# Patient Record
Sex: Female | Born: 1940 | Race: White | Hispanic: No | Smoking: Never smoker
Health system: Southern US, Community
[De-identification: ages and names within clinical notes are randomized; demographics above are authoritative.]

## PROBLEM LIST (undated history)

## (undated) DIAGNOSIS — Z860101 Personal history of adenomatous and serrated colon polyps: Secondary | ICD-10-CM

## (undated) DIAGNOSIS — M810 Age-related osteoporosis without current pathological fracture: Secondary | ICD-10-CM

## (undated) DIAGNOSIS — R1013 Epigastric pain: Secondary | ICD-10-CM

## (undated) DIAGNOSIS — K219 Gastro-esophageal reflux disease without esophagitis: Secondary | ICD-10-CM

## (undated) DIAGNOSIS — H25013 Cortical age-related cataract, bilateral: Secondary | ICD-10-CM

## (undated) DIAGNOSIS — K2970 Gastritis, unspecified, without bleeding: Secondary | ICD-10-CM

## (undated) DIAGNOSIS — A048 Other specified bacterial intestinal infections: Secondary | ICD-10-CM

## (undated) DIAGNOSIS — C4492 Squamous cell carcinoma of skin, unspecified: Secondary | ICD-10-CM

## (undated) DIAGNOSIS — J45909 Unspecified asthma, uncomplicated: Secondary | ICD-10-CM

## (undated) DIAGNOSIS — F329 Major depressive disorder, single episode, unspecified: Secondary | ICD-10-CM

## (undated) DIAGNOSIS — K449 Diaphragmatic hernia without obstruction or gangrene: Secondary | ICD-10-CM

## (undated) DIAGNOSIS — L570 Actinic keratosis: Secondary | ICD-10-CM

## (undated) DIAGNOSIS — F32A Depression, unspecified: Secondary | ICD-10-CM

## (undated) DIAGNOSIS — M1990 Unspecified osteoarthritis, unspecified site: Secondary | ICD-10-CM

## (undated) DIAGNOSIS — K209 Esophagitis, unspecified without bleeding: Secondary | ICD-10-CM

## (undated) DIAGNOSIS — B029 Zoster without complications: Secondary | ICD-10-CM

## (undated) DIAGNOSIS — C801 Malignant (primary) neoplasm, unspecified: Secondary | ICD-10-CM

## (undated) DIAGNOSIS — E7800 Pure hypercholesterolemia, unspecified: Secondary | ICD-10-CM

## (undated) DIAGNOSIS — K279 Peptic ulcer, site unspecified, unspecified as acute or chronic, without hemorrhage or perforation: Secondary | ICD-10-CM

## (undated) DIAGNOSIS — K649 Unspecified hemorrhoids: Secondary | ICD-10-CM

## (undated) DIAGNOSIS — Z86010 Personal history of colonic polyps: Secondary | ICD-10-CM

## (undated) DIAGNOSIS — F419 Anxiety disorder, unspecified: Secondary | ICD-10-CM

## (undated) DIAGNOSIS — Z8719 Personal history of other diseases of the digestive system: Secondary | ICD-10-CM

## (undated) HISTORY — PX: OTHER SURGICAL HISTORY: SHX169

## (undated) HISTORY — DX: Squamous cell carcinoma of skin, unspecified: C44.92

## (undated) HISTORY — DX: Actinic keratosis: L57.0

## (undated) HISTORY — PX: COLON SURGERY: SHX602

## (undated) HISTORY — PX: BREAST CYST ASPIRATION: SHX578

---

## 1974-06-14 HISTORY — PX: TUBAL LIGATION: SHX77

## 1997-06-14 HISTORY — PX: OTHER SURGICAL HISTORY: SHX169

## 1998-06-14 HISTORY — PX: BREAST BIOPSY: SHX20

## 2005-03-03 ENCOUNTER — Ambulatory Visit: Payer: Self-pay

## 2005-06-23 ENCOUNTER — Ambulatory Visit: Payer: Self-pay | Admitting: Unknown Physician Specialty

## 2006-03-07 ENCOUNTER — Ambulatory Visit: Payer: Self-pay | Admitting: *Deleted

## 2007-02-14 ENCOUNTER — Ambulatory Visit: Payer: Self-pay | Admitting: *Deleted

## 2007-04-13 ENCOUNTER — Ambulatory Visit: Payer: Self-pay | Admitting: *Deleted

## 2008-02-20 ENCOUNTER — Ambulatory Visit: Payer: Self-pay | Admitting: *Deleted

## 2008-04-01 ENCOUNTER — Ambulatory Visit: Payer: Self-pay | Admitting: Unknown Physician Specialty

## 2008-04-15 ENCOUNTER — Ambulatory Visit: Payer: Self-pay | Admitting: *Deleted

## 2008-08-30 ENCOUNTER — Ambulatory Visit: Payer: Self-pay | Admitting: Unknown Physician Specialty

## 2008-09-11 ENCOUNTER — Ambulatory Visit: Payer: Self-pay | Admitting: Unknown Physician Specialty

## 2009-04-16 ENCOUNTER — Ambulatory Visit: Payer: Self-pay | Admitting: Anesthesiology

## 2010-05-25 ENCOUNTER — Ambulatory Visit: Payer: Self-pay | Admitting: Anesthesiology

## 2010-10-28 ENCOUNTER — Ambulatory Visit: Payer: Self-pay | Admitting: Ophthalmology

## 2010-11-10 ENCOUNTER — Ambulatory Visit: Payer: Self-pay | Admitting: Ophthalmology

## 2010-12-30 ENCOUNTER — Ambulatory Visit: Payer: Self-pay | Admitting: Anesthesiology

## 2011-03-24 ENCOUNTER — Ambulatory Visit: Payer: Self-pay | Admitting: Ophthalmology

## 2011-06-15 HISTORY — PX: CATARACT EXTRACTION: SUR2

## 2011-06-15 HISTORY — PX: CATARACT EXTRACTION, BILATERAL: SHX1313

## 2011-11-01 DIAGNOSIS — H2510 Age-related nuclear cataract, unspecified eye: Secondary | ICD-10-CM | POA: Diagnosis not present

## 2011-11-01 DIAGNOSIS — Z961 Presence of intraocular lens: Secondary | ICD-10-CM | POA: Diagnosis not present

## 2011-11-11 DIAGNOSIS — H43819 Vitreous degeneration, unspecified eye: Secondary | ICD-10-CM | POA: Diagnosis not present

## 2011-11-26 DIAGNOSIS — H0019 Chalazion unspecified eye, unspecified eyelid: Secondary | ICD-10-CM | POA: Diagnosis not present

## 2011-12-03 DIAGNOSIS — H01009 Unspecified blepharitis unspecified eye, unspecified eyelid: Secondary | ICD-10-CM | POA: Diagnosis not present

## 2011-12-20 DIAGNOSIS — L820 Inflamed seborrheic keratosis: Secondary | ICD-10-CM | POA: Diagnosis not present

## 2011-12-20 DIAGNOSIS — L739 Follicular disorder, unspecified: Secondary | ICD-10-CM | POA: Diagnosis not present

## 2011-12-20 DIAGNOSIS — L821 Other seborrheic keratosis: Secondary | ICD-10-CM | POA: Diagnosis not present

## 2011-12-20 DIAGNOSIS — L819 Disorder of pigmentation, unspecified: Secondary | ICD-10-CM | POA: Diagnosis not present

## 2011-12-21 DIAGNOSIS — H0019 Chalazion unspecified eye, unspecified eyelid: Secondary | ICD-10-CM | POA: Diagnosis not present

## 2012-01-04 DIAGNOSIS — H0019 Chalazion unspecified eye, unspecified eyelid: Secondary | ICD-10-CM | POA: Diagnosis not present

## 2012-01-04 DIAGNOSIS — Z124 Encounter for screening for malignant neoplasm of cervix: Secondary | ICD-10-CM | POA: Diagnosis not present

## 2012-01-04 DIAGNOSIS — N952 Postmenopausal atrophic vaginitis: Secondary | ICD-10-CM | POA: Diagnosis not present

## 2012-01-04 DIAGNOSIS — N764 Abscess of vulva: Secondary | ICD-10-CM | POA: Diagnosis not present

## 2012-01-04 DIAGNOSIS — Z01419 Encounter for gynecological examination (general) (routine) without abnormal findings: Secondary | ICD-10-CM | POA: Diagnosis not present

## 2012-01-04 DIAGNOSIS — L940 Localized scleroderma [morphea]: Secondary | ICD-10-CM | POA: Diagnosis not present

## 2012-01-04 DIAGNOSIS — N393 Stress incontinence (female) (male): Secondary | ICD-10-CM | POA: Diagnosis not present

## 2012-01-10 DIAGNOSIS — M810 Age-related osteoporosis without current pathological fracture: Secondary | ICD-10-CM | POA: Diagnosis not present

## 2012-01-10 DIAGNOSIS — K219 Gastro-esophageal reflux disease without esophagitis: Secondary | ICD-10-CM | POA: Diagnosis not present

## 2012-01-10 DIAGNOSIS — E7800 Pure hypercholesterolemia, unspecified: Secondary | ICD-10-CM | POA: Diagnosis not present

## 2012-01-10 DIAGNOSIS — F329 Major depressive disorder, single episode, unspecified: Secondary | ICD-10-CM | POA: Diagnosis not present

## 2012-01-18 ENCOUNTER — Ambulatory Visit: Payer: Self-pay

## 2012-01-18 DIAGNOSIS — M899 Disorder of bone, unspecified: Secondary | ICD-10-CM | POA: Diagnosis not present

## 2012-01-18 DIAGNOSIS — M810 Age-related osteoporosis without current pathological fracture: Secondary | ICD-10-CM | POA: Diagnosis not present

## 2012-01-19 ENCOUNTER — Ambulatory Visit: Payer: Self-pay | Admitting: Obstetrics and Gynecology

## 2012-01-19 DIAGNOSIS — Z1231 Encounter for screening mammogram for malignant neoplasm of breast: Secondary | ICD-10-CM | POA: Diagnosis not present

## 2012-03-03 DIAGNOSIS — M546 Pain in thoracic spine: Secondary | ICD-10-CM | POA: Diagnosis not present

## 2012-03-03 DIAGNOSIS — M545 Low back pain: Secondary | ICD-10-CM | POA: Diagnosis not present

## 2012-03-03 DIAGNOSIS — I10 Essential (primary) hypertension: Secondary | ICD-10-CM | POA: Diagnosis not present

## 2012-03-24 DIAGNOSIS — E7800 Pure hypercholesterolemia, unspecified: Secondary | ICD-10-CM | POA: Diagnosis not present

## 2012-03-24 DIAGNOSIS — K219 Gastro-esophageal reflux disease without esophagitis: Secondary | ICD-10-CM | POA: Diagnosis not present

## 2012-03-24 DIAGNOSIS — F329 Major depressive disorder, single episode, unspecified: Secondary | ICD-10-CM | POA: Diagnosis not present

## 2012-03-24 DIAGNOSIS — M810 Age-related osteoporosis without current pathological fracture: Secondary | ICD-10-CM | POA: Diagnosis not present

## 2012-03-27 DIAGNOSIS — Z23 Encounter for immunization: Secondary | ICD-10-CM | POA: Diagnosis not present

## 2012-04-03 DIAGNOSIS — S239XXA Sprain of unspecified parts of thorax, initial encounter: Secondary | ICD-10-CM | POA: Diagnosis not present

## 2012-04-06 DIAGNOSIS — R293 Abnormal posture: Secondary | ICD-10-CM | POA: Diagnosis not present

## 2012-04-06 DIAGNOSIS — M545 Low back pain: Secondary | ICD-10-CM | POA: Diagnosis not present

## 2012-04-06 DIAGNOSIS — S239XXA Sprain of unspecified parts of thorax, initial encounter: Secondary | ICD-10-CM | POA: Diagnosis not present

## 2012-04-10 DIAGNOSIS — M545 Low back pain: Secondary | ICD-10-CM | POA: Diagnosis not present

## 2012-04-10 DIAGNOSIS — S239XXA Sprain of unspecified parts of thorax, initial encounter: Secondary | ICD-10-CM | POA: Diagnosis not present

## 2012-04-10 DIAGNOSIS — R293 Abnormal posture: Secondary | ICD-10-CM | POA: Diagnosis not present

## 2012-04-14 DIAGNOSIS — S239XXA Sprain of unspecified parts of thorax, initial encounter: Secondary | ICD-10-CM | POA: Diagnosis not present

## 2012-04-14 DIAGNOSIS — M545 Low back pain: Secondary | ICD-10-CM | POA: Diagnosis not present

## 2012-04-14 DIAGNOSIS — R293 Abnormal posture: Secondary | ICD-10-CM | POA: Diagnosis not present

## 2012-04-18 DIAGNOSIS — M545 Low back pain: Secondary | ICD-10-CM | POA: Diagnosis not present

## 2012-04-18 DIAGNOSIS — R293 Abnormal posture: Secondary | ICD-10-CM | POA: Diagnosis not present

## 2012-04-18 DIAGNOSIS — S239XXA Sprain of unspecified parts of thorax, initial encounter: Secondary | ICD-10-CM | POA: Diagnosis not present

## 2012-04-24 DIAGNOSIS — M545 Low back pain: Secondary | ICD-10-CM | POA: Diagnosis not present

## 2012-04-24 DIAGNOSIS — S239XXA Sprain of unspecified parts of thorax, initial encounter: Secondary | ICD-10-CM | POA: Diagnosis not present

## 2012-04-24 DIAGNOSIS — R293 Abnormal posture: Secondary | ICD-10-CM | POA: Diagnosis not present

## 2012-04-27 DIAGNOSIS — M545 Low back pain: Secondary | ICD-10-CM | POA: Diagnosis not present

## 2012-04-27 DIAGNOSIS — S239XXA Sprain of unspecified parts of thorax, initial encounter: Secondary | ICD-10-CM | POA: Diagnosis not present

## 2012-04-27 DIAGNOSIS — R293 Abnormal posture: Secondary | ICD-10-CM | POA: Diagnosis not present

## 2012-05-05 DIAGNOSIS — F329 Major depressive disorder, single episode, unspecified: Secondary | ICD-10-CM | POA: Diagnosis not present

## 2012-05-05 DIAGNOSIS — E7800 Pure hypercholesterolemia, unspecified: Secondary | ICD-10-CM | POA: Diagnosis not present

## 2012-05-05 DIAGNOSIS — M810 Age-related osteoporosis without current pathological fracture: Secondary | ICD-10-CM | POA: Diagnosis not present

## 2012-05-05 DIAGNOSIS — K219 Gastro-esophageal reflux disease without esophagitis: Secondary | ICD-10-CM | POA: Diagnosis not present

## 2012-05-08 DIAGNOSIS — M545 Low back pain: Secondary | ICD-10-CM | POA: Diagnosis not present

## 2012-07-06 DIAGNOSIS — E7800 Pure hypercholesterolemia, unspecified: Secondary | ICD-10-CM | POA: Diagnosis not present

## 2012-07-06 DIAGNOSIS — R079 Chest pain, unspecified: Secondary | ICD-10-CM | POA: Diagnosis not present

## 2012-07-06 DIAGNOSIS — R109 Unspecified abdominal pain: Secondary | ICD-10-CM | POA: Diagnosis not present

## 2012-07-06 DIAGNOSIS — K219 Gastro-esophageal reflux disease without esophagitis: Secondary | ICD-10-CM | POA: Diagnosis not present

## 2012-07-11 DIAGNOSIS — R002 Palpitations: Secondary | ICD-10-CM | POA: Diagnosis not present

## 2012-07-11 DIAGNOSIS — R0789 Other chest pain: Secondary | ICD-10-CM | POA: Diagnosis not present

## 2012-07-11 DIAGNOSIS — E785 Hyperlipidemia, unspecified: Secondary | ICD-10-CM | POA: Diagnosis not present

## 2012-08-10 DIAGNOSIS — R079 Chest pain, unspecified: Secondary | ICD-10-CM | POA: Diagnosis not present

## 2012-08-10 DIAGNOSIS — R9431 Abnormal electrocardiogram [ECG] [EKG]: Secondary | ICD-10-CM | POA: Diagnosis not present

## 2012-08-10 DIAGNOSIS — R002 Palpitations: Secondary | ICD-10-CM | POA: Diagnosis not present

## 2012-08-30 DIAGNOSIS — R9431 Abnormal electrocardiogram [ECG] [EKG]: Secondary | ICD-10-CM | POA: Diagnosis not present

## 2012-08-30 DIAGNOSIS — E785 Hyperlipidemia, unspecified: Secondary | ICD-10-CM | POA: Diagnosis not present

## 2012-09-01 DIAGNOSIS — F329 Major depressive disorder, single episode, unspecified: Secondary | ICD-10-CM | POA: Diagnosis not present

## 2012-09-01 DIAGNOSIS — M810 Age-related osteoporosis without current pathological fracture: Secondary | ICD-10-CM | POA: Diagnosis not present

## 2012-09-28 DIAGNOSIS — R1310 Dysphagia, unspecified: Secondary | ICD-10-CM | POA: Diagnosis not present

## 2012-09-28 DIAGNOSIS — Z85038 Personal history of other malignant neoplasm of large intestine: Secondary | ICD-10-CM | POA: Diagnosis not present

## 2012-09-28 DIAGNOSIS — K219 Gastro-esophageal reflux disease without esophagitis: Secondary | ICD-10-CM | POA: Diagnosis not present

## 2012-10-19 ENCOUNTER — Ambulatory Visit: Payer: Self-pay | Admitting: Unknown Physician Specialty

## 2012-10-19 DIAGNOSIS — F411 Generalized anxiety disorder: Secondary | ICD-10-CM | POA: Diagnosis not present

## 2012-10-19 DIAGNOSIS — Z7982 Long term (current) use of aspirin: Secondary | ICD-10-CM | POA: Diagnosis not present

## 2012-10-19 DIAGNOSIS — Z85038 Personal history of other malignant neoplasm of large intestine: Secondary | ICD-10-CM | POA: Diagnosis not present

## 2012-10-19 DIAGNOSIS — Z880 Allergy status to penicillin: Secondary | ICD-10-CM | POA: Diagnosis not present

## 2012-10-19 DIAGNOSIS — Z8711 Personal history of peptic ulcer disease: Secondary | ICD-10-CM | POA: Diagnosis not present

## 2012-10-19 DIAGNOSIS — F329 Major depressive disorder, single episode, unspecified: Secondary | ICD-10-CM | POA: Diagnosis not present

## 2012-10-19 DIAGNOSIS — Z09 Encounter for follow-up examination after completed treatment for conditions other than malignant neoplasm: Secondary | ICD-10-CM | POA: Diagnosis not present

## 2012-10-19 DIAGNOSIS — Z79899 Other long term (current) drug therapy: Secondary | ICD-10-CM | POA: Diagnosis not present

## 2012-10-19 DIAGNOSIS — R1310 Dysphagia, unspecified: Secondary | ICD-10-CM | POA: Diagnosis not present

## 2012-10-19 DIAGNOSIS — J45909 Unspecified asthma, uncomplicated: Secondary | ICD-10-CM | POA: Diagnosis not present

## 2012-10-19 DIAGNOSIS — Z882 Allergy status to sulfonamides status: Secondary | ICD-10-CM | POA: Diagnosis not present

## 2012-10-19 DIAGNOSIS — Z8371 Family history of colonic polyps: Secondary | ICD-10-CM | POA: Diagnosis not present

## 2012-10-19 DIAGNOSIS — K2100 Gastro-esophageal reflux disease with esophagitis, without bleeding: Secondary | ICD-10-CM | POA: Diagnosis not present

## 2012-10-19 DIAGNOSIS — K5730 Diverticulosis of large intestine without perforation or abscess without bleeding: Secondary | ICD-10-CM | POA: Diagnosis not present

## 2012-10-19 DIAGNOSIS — K648 Other hemorrhoids: Secondary | ICD-10-CM | POA: Diagnosis not present

## 2012-10-19 DIAGNOSIS — D131 Benign neoplasm of stomach: Secondary | ICD-10-CM | POA: Diagnosis not present

## 2012-10-19 DIAGNOSIS — Z888 Allergy status to other drugs, medicaments and biological substances status: Secondary | ICD-10-CM | POA: Diagnosis not present

## 2012-10-19 LAB — HM COLONOSCOPY

## 2012-10-23 LAB — PATHOLOGY REPORT

## 2012-10-27 DIAGNOSIS — E7800 Pure hypercholesterolemia, unspecified: Secondary | ICD-10-CM | POA: Diagnosis not present

## 2012-10-27 DIAGNOSIS — R21 Rash and other nonspecific skin eruption: Secondary | ICD-10-CM | POA: Diagnosis not present

## 2012-10-27 DIAGNOSIS — F329 Major depressive disorder, single episode, unspecified: Secondary | ICD-10-CM | POA: Diagnosis not present

## 2012-10-27 DIAGNOSIS — K219 Gastro-esophageal reflux disease without esophagitis: Secondary | ICD-10-CM | POA: Diagnosis not present

## 2013-01-09 DIAGNOSIS — N6019 Diffuse cystic mastopathy of unspecified breast: Secondary | ICD-10-CM | POA: Diagnosis not present

## 2013-01-09 DIAGNOSIS — N644 Mastodynia: Secondary | ICD-10-CM | POA: Diagnosis not present

## 2013-01-24 ENCOUNTER — Ambulatory Visit: Payer: Self-pay | Admitting: Obstetrics and Gynecology

## 2013-01-24 DIAGNOSIS — Z1231 Encounter for screening mammogram for malignant neoplasm of breast: Secondary | ICD-10-CM | POA: Diagnosis not present

## 2013-03-02 DIAGNOSIS — F411 Generalized anxiety disorder: Secondary | ICD-10-CM | POA: Diagnosis not present

## 2013-03-02 DIAGNOSIS — K219 Gastro-esophageal reflux disease without esophagitis: Secondary | ICD-10-CM | POA: Diagnosis not present

## 2013-03-02 DIAGNOSIS — E785 Hyperlipidemia, unspecified: Secondary | ICD-10-CM | POA: Diagnosis not present

## 2013-03-02 DIAGNOSIS — R3911 Hesitancy of micturition: Secondary | ICD-10-CM | POA: Diagnosis not present

## 2013-03-29 DIAGNOSIS — Z23 Encounter for immunization: Secondary | ICD-10-CM | POA: Diagnosis not present

## 2013-03-29 DIAGNOSIS — H26499 Other secondary cataract, unspecified eye: Secondary | ICD-10-CM | POA: Diagnosis not present

## 2013-04-03 DIAGNOSIS — L723 Sebaceous cyst: Secondary | ICD-10-CM | POA: Diagnosis not present

## 2013-04-03 DIAGNOSIS — L820 Inflamed seborrheic keratosis: Secondary | ICD-10-CM | POA: Diagnosis not present

## 2013-04-03 DIAGNOSIS — L578 Other skin changes due to chronic exposure to nonionizing radiation: Secondary | ICD-10-CM | POA: Diagnosis not present

## 2013-04-03 DIAGNOSIS — D485 Neoplasm of uncertain behavior of skin: Secondary | ICD-10-CM | POA: Diagnosis not present

## 2013-04-03 DIAGNOSIS — D239 Other benign neoplasm of skin, unspecified: Secondary | ICD-10-CM | POA: Diagnosis not present

## 2013-04-03 DIAGNOSIS — D2330 Other benign neoplasm of skin of unspecified part of face: Secondary | ICD-10-CM | POA: Diagnosis not present

## 2013-04-03 DIAGNOSIS — L570 Actinic keratosis: Secondary | ICD-10-CM | POA: Diagnosis not present

## 2013-04-03 DIAGNOSIS — L821 Other seborrheic keratosis: Secondary | ICD-10-CM | POA: Diagnosis not present

## 2013-04-26 DIAGNOSIS — M549 Dorsalgia, unspecified: Secondary | ICD-10-CM | POA: Diagnosis not present

## 2013-05-02 DIAGNOSIS — H02839 Dermatochalasis of unspecified eye, unspecified eyelid: Secondary | ICD-10-CM | POA: Diagnosis not present

## 2013-05-04 DIAGNOSIS — F411 Generalized anxiety disorder: Secondary | ICD-10-CM | POA: Diagnosis not present

## 2013-05-08 ENCOUNTER — Ambulatory Visit: Payer: Self-pay | Admitting: Orthopedic Surgery

## 2013-05-08 DIAGNOSIS — S22009A Unspecified fracture of unspecified thoracic vertebra, initial encounter for closed fracture: Secondary | ICD-10-CM | POA: Diagnosis not present

## 2013-05-08 DIAGNOSIS — M546 Pain in thoracic spine: Secondary | ICD-10-CM | POA: Diagnosis not present

## 2013-06-14 HISTORY — PX: CARDIAC CATHETERIZATION: SHX172

## 2013-06-26 DIAGNOSIS — L570 Actinic keratosis: Secondary | ICD-10-CM | POA: Diagnosis not present

## 2013-06-26 DIAGNOSIS — L723 Sebaceous cyst: Secondary | ICD-10-CM | POA: Diagnosis not present

## 2013-06-26 DIAGNOSIS — L578 Other skin changes due to chronic exposure to nonionizing radiation: Secondary | ICD-10-CM | POA: Diagnosis not present

## 2013-06-26 DIAGNOSIS — L821 Other seborrheic keratosis: Secondary | ICD-10-CM | POA: Diagnosis not present

## 2013-06-26 DIAGNOSIS — L820 Inflamed seborrheic keratosis: Secondary | ICD-10-CM | POA: Diagnosis not present

## 2013-07-05 DIAGNOSIS — T1510XA Foreign body in conjunctival sac, unspecified eye, initial encounter: Secondary | ICD-10-CM | POA: Diagnosis not present

## 2013-08-03 DIAGNOSIS — E785 Hyperlipidemia, unspecified: Secondary | ICD-10-CM | POA: Diagnosis not present

## 2013-08-03 DIAGNOSIS — F3289 Other specified depressive episodes: Secondary | ICD-10-CM | POA: Diagnosis not present

## 2013-08-03 DIAGNOSIS — K219 Gastro-esophageal reflux disease without esophagitis: Secondary | ICD-10-CM | POA: Diagnosis not present

## 2013-08-03 DIAGNOSIS — F329 Major depressive disorder, single episode, unspecified: Secondary | ICD-10-CM | POA: Diagnosis not present

## 2013-09-05 DIAGNOSIS — R0982 Postnasal drip: Secondary | ICD-10-CM | POA: Diagnosis not present

## 2013-09-05 DIAGNOSIS — H6120 Impacted cerumen, unspecified ear: Secondary | ICD-10-CM | POA: Diagnosis not present

## 2013-09-25 DIAGNOSIS — D1800 Hemangioma unspecified site: Secondary | ICD-10-CM | POA: Diagnosis not present

## 2013-09-25 DIAGNOSIS — L821 Other seborrheic keratosis: Secondary | ICD-10-CM | POA: Diagnosis not present

## 2013-09-25 DIAGNOSIS — L578 Other skin changes due to chronic exposure to nonionizing radiation: Secondary | ICD-10-CM | POA: Diagnosis not present

## 2013-09-25 DIAGNOSIS — L570 Actinic keratosis: Secondary | ICD-10-CM | POA: Diagnosis not present

## 2013-09-25 DIAGNOSIS — D239 Other benign neoplasm of skin, unspecified: Secondary | ICD-10-CM | POA: Diagnosis not present

## 2013-10-05 DIAGNOSIS — M810 Age-related osteoporosis without current pathological fracture: Secondary | ICD-10-CM | POA: Diagnosis not present

## 2013-10-05 DIAGNOSIS — R5381 Other malaise: Secondary | ICD-10-CM | POA: Diagnosis not present

## 2013-10-05 DIAGNOSIS — F329 Major depressive disorder, single episode, unspecified: Secondary | ICD-10-CM | POA: Diagnosis not present

## 2013-10-05 DIAGNOSIS — F3289 Other specified depressive episodes: Secondary | ICD-10-CM | POA: Diagnosis not present

## 2013-10-05 DIAGNOSIS — R5383 Other fatigue: Secondary | ICD-10-CM | POA: Diagnosis not present

## 2013-10-05 DIAGNOSIS — F411 Generalized anxiety disorder: Secondary | ICD-10-CM | POA: Diagnosis not present

## 2013-11-09 DIAGNOSIS — J40 Bronchitis, not specified as acute or chronic: Secondary | ICD-10-CM | POA: Diagnosis not present

## 2013-11-20 DIAGNOSIS — R234 Changes in skin texture: Secondary | ICD-10-CM | POA: Diagnosis not present

## 2013-11-20 DIAGNOSIS — L723 Sebaceous cyst: Secondary | ICD-10-CM | POA: Diagnosis not present

## 2013-12-03 DIAGNOSIS — D360 Benign neoplasm of lymph nodes: Secondary | ICD-10-CM | POA: Diagnosis not present

## 2013-12-03 DIAGNOSIS — D485 Neoplasm of uncertain behavior of skin: Secondary | ICD-10-CM | POA: Diagnosis not present

## 2013-12-18 DIAGNOSIS — K219 Gastro-esophageal reflux disease without esophagitis: Secondary | ICD-10-CM | POA: Diagnosis not present

## 2013-12-18 DIAGNOSIS — F411 Generalized anxiety disorder: Secondary | ICD-10-CM | POA: Diagnosis not present

## 2013-12-18 DIAGNOSIS — E785 Hyperlipidemia, unspecified: Secondary | ICD-10-CM | POA: Diagnosis not present

## 2013-12-28 DIAGNOSIS — Z961 Presence of intraocular lens: Secondary | ICD-10-CM | POA: Diagnosis not present

## 2013-12-28 DIAGNOSIS — H2510 Age-related nuclear cataract, unspecified eye: Secondary | ICD-10-CM | POA: Diagnosis not present

## 2014-02-15 DIAGNOSIS — R234 Changes in skin texture: Secondary | ICD-10-CM | POA: Diagnosis not present

## 2014-02-15 DIAGNOSIS — L905 Scar conditions and fibrosis of skin: Secondary | ICD-10-CM | POA: Diagnosis not present

## 2014-02-19 DIAGNOSIS — Z1231 Encounter for screening mammogram for malignant neoplasm of breast: Secondary | ICD-10-CM | POA: Diagnosis not present

## 2014-02-19 DIAGNOSIS — Z01419 Encounter for gynecological examination (general) (routine) without abnormal findings: Secondary | ICD-10-CM | POA: Diagnosis not present

## 2014-03-21 ENCOUNTER — Ambulatory Visit: Payer: Self-pay | Admitting: Obstetrics and Gynecology

## 2014-03-21 DIAGNOSIS — Z1231 Encounter for screening mammogram for malignant neoplasm of breast: Secondary | ICD-10-CM | POA: Diagnosis not present

## 2014-03-22 DIAGNOSIS — M810 Age-related osteoporosis without current pathological fracture: Secondary | ICD-10-CM | POA: Diagnosis not present

## 2014-03-22 DIAGNOSIS — R296 Repeated falls: Secondary | ICD-10-CM | POA: Diagnosis not present

## 2014-03-22 DIAGNOSIS — R079 Chest pain, unspecified: Secondary | ICD-10-CM | POA: Diagnosis not present

## 2014-03-22 DIAGNOSIS — E785 Hyperlipidemia, unspecified: Secondary | ICD-10-CM | POA: Diagnosis not present

## 2014-03-22 DIAGNOSIS — Z23 Encounter for immunization: Secondary | ICD-10-CM | POA: Diagnosis not present

## 2014-03-22 DIAGNOSIS — R5383 Other fatigue: Secondary | ICD-10-CM | POA: Diagnosis not present

## 2014-03-22 DIAGNOSIS — Z1389 Encounter for screening for other disorder: Secondary | ICD-10-CM | POA: Diagnosis not present

## 2014-03-25 DIAGNOSIS — E782 Mixed hyperlipidemia: Secondary | ICD-10-CM | POA: Diagnosis not present

## 2014-03-25 DIAGNOSIS — R002 Palpitations: Secondary | ICD-10-CM | POA: Insufficient documentation

## 2014-03-25 DIAGNOSIS — R079 Chest pain, unspecified: Secondary | ICD-10-CM | POA: Diagnosis not present

## 2014-03-28 DIAGNOSIS — F329 Major depressive disorder, single episode, unspecified: Secondary | ICD-10-CM | POA: Diagnosis not present

## 2014-03-28 DIAGNOSIS — F419 Anxiety disorder, unspecified: Secondary | ICD-10-CM | POA: Diagnosis not present

## 2014-03-28 DIAGNOSIS — K219 Gastro-esophageal reflux disease without esophagitis: Secondary | ICD-10-CM | POA: Diagnosis not present

## 2014-03-28 DIAGNOSIS — E785 Hyperlipidemia, unspecified: Secondary | ICD-10-CM | POA: Diagnosis not present

## 2014-04-01 DIAGNOSIS — R9431 Abnormal electrocardiogram [ECG] [EKG]: Secondary | ICD-10-CM | POA: Diagnosis not present

## 2014-04-01 DIAGNOSIS — E782 Mixed hyperlipidemia: Secondary | ICD-10-CM | POA: Diagnosis not present

## 2014-04-01 DIAGNOSIS — R002 Palpitations: Secondary | ICD-10-CM | POA: Diagnosis not present

## 2014-04-01 DIAGNOSIS — R0602 Shortness of breath: Secondary | ICD-10-CM | POA: Diagnosis not present

## 2014-04-01 DIAGNOSIS — R079 Chest pain, unspecified: Secondary | ICD-10-CM | POA: Diagnosis not present

## 2014-04-04 DIAGNOSIS — J069 Acute upper respiratory infection, unspecified: Secondary | ICD-10-CM | POA: Diagnosis not present

## 2014-04-11 LAB — HM MAMMOGRAPHY

## 2014-04-15 DIAGNOSIS — R002 Palpitations: Secondary | ICD-10-CM | POA: Diagnosis not present

## 2014-04-15 DIAGNOSIS — E782 Mixed hyperlipidemia: Secondary | ICD-10-CM | POA: Diagnosis not present

## 2014-04-15 DIAGNOSIS — R079 Chest pain, unspecified: Secondary | ICD-10-CM | POA: Diagnosis not present

## 2014-04-15 DIAGNOSIS — R0602 Shortness of breath: Secondary | ICD-10-CM | POA: Diagnosis not present

## 2014-04-23 ENCOUNTER — Ambulatory Visit: Payer: Self-pay | Admitting: Internal Medicine

## 2014-04-23 DIAGNOSIS — Z806 Family history of leukemia: Secondary | ICD-10-CM | POA: Diagnosis not present

## 2014-04-23 DIAGNOSIS — Z8249 Family history of ischemic heart disease and other diseases of the circulatory system: Secondary | ICD-10-CM | POA: Diagnosis not present

## 2014-04-23 DIAGNOSIS — Z8711 Personal history of peptic ulcer disease: Secondary | ICD-10-CM | POA: Diagnosis not present

## 2014-04-23 DIAGNOSIS — Z8489 Family history of other specified conditions: Secondary | ICD-10-CM | POA: Diagnosis not present

## 2014-04-23 DIAGNOSIS — Z825 Family history of asthma and other chronic lower respiratory diseases: Secondary | ICD-10-CM | POA: Diagnosis not present

## 2014-04-23 DIAGNOSIS — Z86010 Personal history of colonic polyps: Secondary | ICD-10-CM | POA: Diagnosis not present

## 2014-04-23 DIAGNOSIS — Z7982 Long term (current) use of aspirin: Secondary | ICD-10-CM | POA: Diagnosis not present

## 2014-04-23 DIAGNOSIS — I200 Unstable angina: Secondary | ICD-10-CM | POA: Diagnosis not present

## 2014-04-23 DIAGNOSIS — R002 Palpitations: Secondary | ICD-10-CM | POA: Diagnosis not present

## 2014-04-23 DIAGNOSIS — Z886 Allergy status to analgesic agent status: Secondary | ICD-10-CM | POA: Diagnosis not present

## 2014-04-23 DIAGNOSIS — Z8371 Family history of colonic polyps: Secondary | ICD-10-CM | POA: Diagnosis not present

## 2014-04-23 DIAGNOSIS — Z79899 Other long term (current) drug therapy: Secondary | ICD-10-CM | POA: Diagnosis not present

## 2014-04-23 DIAGNOSIS — Z882 Allergy status to sulfonamides status: Secondary | ICD-10-CM | POA: Diagnosis not present

## 2014-04-23 DIAGNOSIS — M810 Age-related osteoporosis without current pathological fracture: Secondary | ICD-10-CM | POA: Diagnosis not present

## 2014-04-23 DIAGNOSIS — Z85038 Personal history of other malignant neoplasm of large intestine: Secondary | ICD-10-CM | POA: Diagnosis not present

## 2014-04-23 DIAGNOSIS — Z880 Allergy status to penicillin: Secondary | ICD-10-CM | POA: Diagnosis not present

## 2014-04-23 DIAGNOSIS — R948 Abnormal results of function studies of other organs and systems: Secondary | ICD-10-CM | POA: Diagnosis not present

## 2014-04-23 DIAGNOSIS — Z9109 Other allergy status, other than to drugs and biological substances: Secondary | ICD-10-CM | POA: Diagnosis not present

## 2014-04-23 DIAGNOSIS — J45909 Unspecified asthma, uncomplicated: Secondary | ICD-10-CM | POA: Diagnosis not present

## 2014-04-23 DIAGNOSIS — R079 Chest pain, unspecified: Secondary | ICD-10-CM | POA: Diagnosis not present

## 2014-04-29 ENCOUNTER — Ambulatory Visit: Payer: Self-pay | Admitting: Family Medicine

## 2014-04-29 DIAGNOSIS — M810 Age-related osteoporosis without current pathological fracture: Secondary | ICD-10-CM | POA: Diagnosis not present

## 2014-04-29 DIAGNOSIS — M8580 Other specified disorders of bone density and structure, unspecified site: Secondary | ICD-10-CM | POA: Diagnosis not present

## 2014-05-06 DIAGNOSIS — R079 Chest pain, unspecified: Secondary | ICD-10-CM | POA: Diagnosis not present

## 2014-05-06 DIAGNOSIS — R0602 Shortness of breath: Secondary | ICD-10-CM | POA: Diagnosis not present

## 2014-05-07 DIAGNOSIS — L905 Scar conditions and fibrosis of skin: Secondary | ICD-10-CM | POA: Diagnosis not present

## 2014-05-07 DIAGNOSIS — L820 Inflamed seborrheic keratosis: Secondary | ICD-10-CM | POA: Diagnosis not present

## 2014-05-07 DIAGNOSIS — L821 Other seborrheic keratosis: Secondary | ICD-10-CM | POA: Diagnosis not present

## 2014-05-17 DIAGNOSIS — F329 Major depressive disorder, single episode, unspecified: Secondary | ICD-10-CM | POA: Diagnosis not present

## 2014-05-17 DIAGNOSIS — F419 Anxiety disorder, unspecified: Secondary | ICD-10-CM | POA: Diagnosis not present

## 2014-05-17 DIAGNOSIS — M810 Age-related osteoporosis without current pathological fracture: Secondary | ICD-10-CM | POA: Diagnosis not present

## 2014-05-31 DIAGNOSIS — R350 Frequency of micturition: Secondary | ICD-10-CM | POA: Diagnosis not present

## 2014-08-08 DIAGNOSIS — F329 Major depressive disorder, single episode, unspecified: Secondary | ICD-10-CM | POA: Diagnosis not present

## 2014-08-08 DIAGNOSIS — K219 Gastro-esophageal reflux disease without esophagitis: Secondary | ICD-10-CM | POA: Diagnosis not present

## 2014-08-08 DIAGNOSIS — E785 Hyperlipidemia, unspecified: Secondary | ICD-10-CM | POA: Diagnosis not present

## 2014-08-18 DIAGNOSIS — W5381XA Bitten by other rodent, initial encounter: Secondary | ICD-10-CM | POA: Diagnosis not present

## 2014-08-18 DIAGNOSIS — Z23 Encounter for immunization: Secondary | ICD-10-CM | POA: Diagnosis not present

## 2014-08-18 DIAGNOSIS — L03113 Cellulitis of right upper limb: Secondary | ICD-10-CM | POA: Diagnosis not present

## 2014-09-05 DIAGNOSIS — H43811 Vitreous degeneration, right eye: Secondary | ICD-10-CM | POA: Diagnosis not present

## 2014-10-22 DIAGNOSIS — F418 Other specified anxiety disorders: Secondary | ICD-10-CM | POA: Diagnosis not present

## 2014-10-22 DIAGNOSIS — K219 Gastro-esophageal reflux disease without esophagitis: Secondary | ICD-10-CM | POA: Diagnosis not present

## 2014-10-22 DIAGNOSIS — M1990 Unspecified osteoarthritis, unspecified site: Secondary | ICD-10-CM | POA: Diagnosis not present

## 2014-10-22 DIAGNOSIS — J309 Allergic rhinitis, unspecified: Secondary | ICD-10-CM | POA: Diagnosis not present

## 2014-10-22 DIAGNOSIS — Z7189 Other specified counseling: Secondary | ICD-10-CM | POA: Diagnosis not present

## 2014-10-22 DIAGNOSIS — K5900 Constipation, unspecified: Secondary | ICD-10-CM | POA: Diagnosis not present

## 2014-10-22 DIAGNOSIS — E782 Mixed hyperlipidemia: Secondary | ICD-10-CM | POA: Diagnosis not present

## 2014-10-22 DIAGNOSIS — M810 Age-related osteoporosis without current pathological fracture: Secondary | ICD-10-CM | POA: Diagnosis not present

## 2014-10-22 LAB — CBC AND DIFFERENTIAL
HCT: 40 % (ref 36–46)
Hemoglobin: 13.3 g/dL (ref 12.0–16.0)
Neutrophils Absolute: 47 /uL
Platelets: 161 10*3/uL (ref 150–399)
WBC: 5 10^3/mL

## 2014-10-22 LAB — BASIC METABOLIC PANEL
BUN: 12 mg/dL (ref 4–21)
Creatinine: 0.7 mg/dL (ref 0.5–1.1)
Glucose: 87 mg/dL
Potassium: 3.8 mmol/L (ref 3.4–5.3)
SODIUM: 143 mmol/L (ref 137–147)

## 2014-10-22 LAB — LIPID PANEL
CHOLESTEROL: 206 mg/dL — AB (ref 0–200)
HDL: 83 mg/dL — AB (ref 35–70)
LDL Cholesterol: 107 mg/dL
TRIGLYCERIDES: 82 mg/dL (ref 40–160)

## 2014-10-22 LAB — HEPATIC FUNCTION PANEL
ALK PHOS: 40 U/L (ref 25–125)
ALT: 14 U/L (ref 7–35)
AST: 17 U/L (ref 13–35)
Bilirubin, Total: 0.3 mg/dL

## 2014-10-22 LAB — TSH: TSH: 2.32 u[IU]/mL (ref 0.41–5.90)

## 2014-10-28 DIAGNOSIS — F32A Anxiety disorder, unspecified: Secondary | ICD-10-CM | POA: Insufficient documentation

## 2014-10-28 DIAGNOSIS — F419 Anxiety disorder, unspecified: Secondary | ICD-10-CM

## 2014-10-28 DIAGNOSIS — K219 Gastro-esophageal reflux disease without esophagitis: Secondary | ICD-10-CM | POA: Insufficient documentation

## 2014-10-28 DIAGNOSIS — K5909 Other constipation: Secondary | ICD-10-CM | POA: Insufficient documentation

## 2014-10-28 DIAGNOSIS — Z789 Other specified health status: Secondary | ICD-10-CM | POA: Insufficient documentation

## 2014-10-28 DIAGNOSIS — E7849 Other hyperlipidemia: Secondary | ICD-10-CM | POA: Insufficient documentation

## 2014-10-28 DIAGNOSIS — M810 Age-related osteoporosis without current pathological fracture: Secondary | ICD-10-CM | POA: Insufficient documentation

## 2014-10-28 DIAGNOSIS — M1990 Unspecified osteoarthritis, unspecified site: Secondary | ICD-10-CM | POA: Insufficient documentation

## 2014-10-28 DIAGNOSIS — F329 Major depressive disorder, single episode, unspecified: Secondary | ICD-10-CM | POA: Insufficient documentation

## 2014-10-28 DIAGNOSIS — C801 Malignant (primary) neoplasm, unspecified: Secondary | ICD-10-CM | POA: Insufficient documentation

## 2014-10-28 DIAGNOSIS — K259 Gastric ulcer, unspecified as acute or chronic, without hemorrhage or perforation: Secondary | ICD-10-CM | POA: Insufficient documentation

## 2014-10-28 DIAGNOSIS — Z86010 Personal history of colonic polyps: Secondary | ICD-10-CM | POA: Insufficient documentation

## 2014-10-28 DIAGNOSIS — Z85038 Personal history of other malignant neoplasm of large intestine: Secondary | ICD-10-CM | POA: Insufficient documentation

## 2014-11-12 DIAGNOSIS — L821 Other seborrheic keratosis: Secondary | ICD-10-CM | POA: Diagnosis not present

## 2014-11-12 DIAGNOSIS — D229 Melanocytic nevi, unspecified: Secondary | ICD-10-CM | POA: Diagnosis not present

## 2014-11-12 DIAGNOSIS — L814 Other melanin hyperpigmentation: Secondary | ICD-10-CM | POA: Diagnosis not present

## 2014-11-12 DIAGNOSIS — L820 Inflamed seborrheic keratosis: Secondary | ICD-10-CM | POA: Diagnosis not present

## 2014-12-20 ENCOUNTER — Encounter: Payer: Self-pay | Admitting: Family Medicine

## 2015-02-10 ENCOUNTER — Encounter: Payer: Self-pay | Admitting: Family Medicine

## 2015-02-10 ENCOUNTER — Ambulatory Visit
Admission: RE | Admit: 2015-02-10 | Discharge: 2015-02-10 | Disposition: A | Discharge status: Home / Self Care | Payer: Medicare Other | Source: Ambulatory Visit | Attending: Family Medicine | Admitting: Family Medicine

## 2015-02-10 ENCOUNTER — Ambulatory Visit (INDEPENDENT_AMBULATORY_CARE_PROVIDER_SITE_OTHER): Payer: Medicare Other | Admitting: Family Medicine

## 2015-02-10 VITALS — BP 94/62 | HR 85 | Temp 97.7°F | Resp 16 | Wt 113.8 lb

## 2015-02-10 DIAGNOSIS — M545 Low back pain, unspecified: Secondary | ICD-10-CM | POA: Insufficient documentation

## 2015-02-10 DIAGNOSIS — E782 Mixed hyperlipidemia: Secondary | ICD-10-CM | POA: Diagnosis not present

## 2015-02-10 DIAGNOSIS — M810 Age-related osteoporosis without current pathological fracture: Secondary | ICD-10-CM

## 2015-02-10 DIAGNOSIS — F418 Other specified anxiety disorders: Secondary | ICD-10-CM | POA: Diagnosis not present

## 2015-02-10 DIAGNOSIS — K5900 Constipation, unspecified: Secondary | ICD-10-CM | POA: Diagnosis not present

## 2015-02-10 DIAGNOSIS — F329 Major depressive disorder, single episode, unspecified: Secondary | ICD-10-CM | POA: Diagnosis not present

## 2015-02-10 DIAGNOSIS — R5383 Other fatigue: Secondary | ICD-10-CM | POA: Diagnosis not present

## 2015-02-10 DIAGNOSIS — I700 Atherosclerosis of aorta: Secondary | ICD-10-CM | POA: Diagnosis not present

## 2015-02-10 DIAGNOSIS — F32A Depression, unspecified: Secondary | ICD-10-CM

## 2015-02-10 DIAGNOSIS — F419 Anxiety disorder, unspecified: Secondary | ICD-10-CM

## 2015-02-10 DIAGNOSIS — M549 Dorsalgia, unspecified: Secondary | ICD-10-CM | POA: Diagnosis present

## 2015-02-10 MED ORDER — CLONAZEPAM 1 MG PO TBDP: 1.0000 mg | ORAL_TABLET | Freq: Every day | ORAL | Status: DC

## 2015-02-10 NOTE — Progress Notes (Signed)
Patient ID: Ann Young, female   DOB: 12-23-1940, 74 y.o.   MRN: [id number]   Chief Complaint  Patient presents with  . Follow-up    3 month follow up   Subjective:  HPI  This 74 year old female presents for follow up of anxiety with panic episodes. Well controlled as long as she remembers to take the Clonazepam 1 mg at bedtime and Celexa 40 mg qd. No depressive symptoms or suicidal ideation. Sleeping fairly well and feels appetite has been normal.  Also, has noticed recurrent flares of left upper lumbar pressure/tightness pain that will radiate to the left flank area. Usually occurs with house work and promptly resolves with lying down for 30 minutes. Denies any falls, weakness, numbness or injuries. With history of GERD and gastric ulcer, she does not tolerate NSAID's but gets some help with Robaxin at night.   Prior to Admission medications   Medication Sig Start Date End Date Taking? Authorizing Provider  alendronate (FOSAMAX) 70 MG tablet Take 1 tablet by mouth once a week.   Yes Historical Provider, MD  aspirin 81 MG tablet Take 1 tablet by mouth daily.   Yes Historical Provider, MD  Calcium Carb-Cholecalciferol 600-200 MG-UNIT TABS Take 1 tablet by mouth 2 (two) times daily.   Yes Historical Provider, MD  cetirizine (ZYRTEC) 10 MG tablet Take 1 tablet by mouth daily.   Yes Historical Provider, MD  Cholecalciferol 1000 UNITS capsule Take 1 capsule by mouth daily.   Yes Historical Provider, MD  citalopram (CELEXA) 40 MG tablet Take 1 tablet by mouth daily.   Yes Historical Provider, MD  clonazePAM (KLONOPIN) 1 MG disintegrating tablet Take 1 tablet by mouth at bedtime.   Yes Historical Provider, MD  fluticasone (FLONASE) 50 MCG/ACT nasal spray Place 1 spray into the nose daily. Each nostril   Yes Historical Provider, MD  lovastatin (MEVACOR) 40 MG tablet Take 1 tablet by mouth daily.   Yes Historical Provider, MD  MULTIPLE VITAMIN PO Take 1 capsule by mouth daily.   Yes Historical  Provider, MD  OMEGA-3 FATTY ACIDS PO Take 1 tablet by mouth daily.   Yes Historical Provider, MD  omeprazole (PRILOSEC) 40 MG capsule Take 1 capsule by mouth daily.   Yes Historical Provider, MD  ranitidine (ZANTAC) 150 MG tablet Take 1 tablet by mouth daily.   Yes Historical Provider, MD    Patient Active Problem List   Diagnosis Date Noted  . Problems influencing health status 10/28/2014  . Arthritis 10/28/2014  . Malignant neoplasm 10/28/2014  . History of colonic polyps 10/28/2014  . Chronic constipation 10/28/2014  . Anxiety and depression 10/28/2014  . Acid reflux 10/28/2014  . History of malignant neoplasm of large intestine 10/28/2014  . 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 OP (osteoporosis) 10/28/2014  . Gastric ulcer 10/28/2014  . 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 Awareness of heartbeats 03/25/2014   Past Surgical History  Procedure Laterality Date  . Tubal ligation  1976  . Breast biopsy Left 2000    benign  . Resection of colorectal cancer without sequela  1999  . Cataract extraction, bilateral Bilateral 2013    Dr. Willy Eddy   History reviewed. No pertinent past medical history.  Social History   Social History  . Marital Status: Married    Spouse Name: N/A  . Number of Children: N/A  . Years of Education: N/A   Occupational History  . Not on file.   Social History  Main Topics  . Smoking status: Never Smoker   . Smokeless tobacco: Never Used  . Alcohol Use: No  . Drug Use: No  . Sexual Activity: Not on file   Other Topics Concern  . Not on file   Social History Narrative   Allergies  Allergen Reactions  . Clarithromycin Other (See Comments)    Questionable Biaxin vs Flagyl with chest discomfort & irregular heartbeat (most likely thought to be related to the Biaxin)  . Ibuprofen Other (See Comments)    To prevent swelling,increase hr/bp  . Lipitor  [Atorvastatin] Other (See Comments)     Mayalgia  . Metronidazole Other (See Comments)    Questionable Biaxin vs Flagyl with chest discomfort & irregular heartbeat (most likely thought to be related to the Biaxin)  . Other Other (See Comments)    decongestants  . Penicillins   . Simvastatin Other (See Comments)    Hari Loss  . Sulfa Antibiotics   . Toviaz  [Fesoterodine Fumarate Er] Other (See Comments)    weakness   Review of Systems  Constitutional: Positive for malaise/fatigue.  HENT: Positive for congestion. Negative for ear pain and sore throat.        Occasional nasal stuffiness and sneezing. Controlled by Zyrtec or Benadryl.  Respiratory: Positive for wheezing.        Occasional with housework.   Gastrointestinal: Positive for heartburn.       Well controlled with Prilosec or Zantac.  Genitourinary: Positive for urgency.       History of OAB but stopped all that medication because of cost. Get up 2-3 times a night - "for years".  Musculoskeletal: Positive for back pain.  Neurological: Negative for headaches.    There is no immunization history on file for this patient. Objective:  BP 94/62 mmHg  Pulse 85  Temp(Src) 97.7 F (36.5 C) (Oral)  Resp 16  Wt 113 lb 12.8 oz (51.619 kg)  SpO2 94%  Physical Exam  Constitutional: She is oriented to person, place, and time and well-developed, well-nourished, and in no distress.  HENT:  Head: Normocephalic and atraumatic.  Eyes: Conjunctivae and EOM are normal.  Neck: Normal range of motion. Neck supple.  Cardiovascular: Normal rate, regular rhythm and normal heart sounds.   Pulmonary/Chest: Effort normal and breath sounds normal.  Abdominal: Soft. Bowel sounds are normal.  Musculoskeletal: Normal range of motion.  Intermittent pain in left upper lumbar region. Suspect spasms but good ROM today. SLR's 90 degrees without pain. Good strength throughout. No tenderness to palpation or percussion.  Neurological: She is alert and oriented to person, place, and time. She  has normal reflexes.  Psychiatric: Memory, affect and judgment normal. Her mood appears anxious. She exhibits a depressed mood.   Lab Results  Component Value Date   WBC 5.0 10/22/2014   HGB 13.3 10/22/2014   HCT 40 10/22/2014   PLT 161 10/22/2014   CHOL 206* 10/22/2014   TRIG 82 10/22/2014   HDL 83* 10/22/2014   LDLCALC 107 10/22/2014   TSH 2.32 10/22/2014    CMP     Component Value Date/Time   NA 143 10/22/2014   K 3.8 10/22/2014   BUN 12 10/22/2014   CREATININE 0.7 10/22/2014   AST 17 10/22/2014   ALT 14 10/22/2014   ALKPHOS 40 10/22/2014    Assessment and Plan :  1. Low back pain without sciatica, unspecified back pain laterality Has reported back pains for years with a history of arthritis. Robaxin helps  with spasms but afraid to take NSAID's with her history of  GERD and gastric ulcer. No radiation of pain or loss of strength. - CBC with Differential/Platelet - DG Lumbar Spine Complete; Future  2. Anxiety and depression  Well controlled with Clonazepam 1 mg at bedtime and Celexa 40 mg qd without side effects. No hangover sensation. Will refill Clonazepam and recheck in 3 months. - clonazePAM (KLONOPIN) 1 MG disintegrating tablet; Take 1 tablet (1 mg total) by mouth at bedtime.  Dispense: 90 tablet; Refill: 0  3. Combined fat and carbohydrate induced hyperlipemia Tolerating Lovastatin 40 mg qd without significant myalgias. Will recheck labs and continue low fat diet. Continue Omega-3 Fish oil and follow up pending reports - COMPLETE METABOLIC PANEL WITH GFR - Lipid panel  4. Fatigue due to depression Will recheck chemistry. Suspected secondary to depression and anxiety. Has plans for follow up colonoscopy with Dr. Vira Agar soon with history of colorectal cancer in 1999. Denies hematemesis, hematochezia or melena. No constipation or diarrhea.  5. Osteoporosis Last BMD was in 2015. Did not feel the Alendronate was helping back discomfort, so, she decided to stop  taking it. Advised this is not a medication for pain or arthritis and she should take it regularly to prevent further thinning or bone fractures. Follow up pending LS-spine x-rays. - DG Lumbar Spine Complete; Future    Miguel Aschoff MD [facility] 02/10/2015 10:51 AM

## 2015-02-11 LAB — COMPREHENSIVE METABOLIC PANEL
ALT: 17 IU/L (ref 0–32)
AST: 20 IU/L (ref 0–40)
Albumin/Globulin Ratio: 1.9 (ref 1.1–2.5)
Albumin: 4.7 g/dL (ref 3.5–4.8)
Alkaline Phosphatase: 42 IU/L (ref 39–117)
BUN/Creatinine Ratio: 19 (ref 11–26)
BUN: 14 mg/dL (ref 8–27)
Bilirubin Total: 0.4 mg/dL (ref 0.0–1.2)
CALCIUM: 9.9 mg/dL (ref 8.7–10.3)
CO2: 28 mmol/L (ref 18–29)
CREATININE: 0.74 mg/dL (ref 0.57–1.00)
Chloride: 99 mmol/L (ref 97–108)
GFR calc Af Amer: 94 mL/min/{1.73_m2} (ref >59)
GFR calc non Af Amer: 81 mL/min/{1.73_m2} (ref >59)
GLUCOSE: 90 mg/dL (ref 65–99)
Globulin, Total: 2.5 g/dL (ref 1.5–4.5)
Potassium: 4.8 mmol/L (ref 3.5–5.2)
SODIUM: 141 mmol/L (ref 134–144)
Total Protein: 7.2 g/dL (ref 6.0–8.5)

## 2015-02-11 LAB — CBC WITH DIFFERENTIAL/PLATELET
BASOS: 1 %
Basophils Absolute: 0 10*3/uL (ref 0.0–0.2)
EOS (ABSOLUTE): 0.1 10*3/uL (ref 0.0–0.4)
Eos: 2 %
Hematocrit: 39.4 % (ref 34.0–46.6)
Hemoglobin: 13.3 g/dL (ref 11.1–15.9)
Immature Grans (Abs): 0 10*3/uL (ref 0.0–0.1)
Immature Granulocytes: 0 %
LYMPHS ABS: 1.7 10*3/uL (ref 0.7–3.1)
LYMPHS: 36 %
MCH: 32.1 pg (ref 26.6–33.0)
MCHC: 33.8 g/dL (ref 31.5–35.7)
MCV: 95 fL (ref 79–97)
Monocytes Absolute: 0.6 10*3/uL (ref 0.1–0.9)
Monocytes: 12 %
NEUTROS ABS: 2.2 10*3/uL (ref 1.4–7.0)
Neutrophils: 49 %
Platelets: 155 10*3/uL (ref 150–379)
RBC: 4.14 x10E6/uL (ref 3.77–5.28)
RDW: 12.8 % (ref 12.3–15.4)
WBC: 4.6 10*3/uL (ref 3.4–10.8)

## 2015-02-11 LAB — LIPID PANEL
Chol/HDL Ratio: 2.5 ratio units (ref 0.0–4.4)
Cholesterol, Total: 199 mg/dL (ref 100–199)
HDL: 79 mg/dL (ref >39)
LDL CALC: 104 mg/dL — AB (ref 0–99)
Triglycerides: 79 mg/dL (ref 0–149)
VLDL CHOLESTEROL CAL: 16 mg/dL (ref 5–40)

## 2015-02-12 ENCOUNTER — Telehealth: Payer: Self-pay

## 2015-02-12 NOTE — Telephone Encounter (Signed)
Patient advised as directed below. Patient verbalized understanding.  

## 2015-02-12 NOTE — Telephone Encounter (Signed)
-----   Message from Margo Common, [place] sent at 02/11/2015  6:17 PM EDT ----- All blood tests normal. Continue present medications and recheck in 3 months.

## 2015-02-12 NOTE — Telephone Encounter (Signed)
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 No collapsed vertebrae from osteoporosis. Recommend Tylenol Arthritis for any back discomfort and stool softener (Colase or Surfak) for constipation seen on x-ray. Should continue Alendronate to prevent further progression of osteoporosis.

## 2015-02-12 NOTE — Telephone Encounter (Signed)
Patient advised as directed below. Patient verbalized understanding and agrees with treatment plan. 

## 2015-02-14 ENCOUNTER — Telehealth: Payer: Self-pay | Admitting: Family Medicine

## 2015-02-14 NOTE — Telephone Encounter (Signed)
They want to know if they can change her klonopin to the regular 1mg  pill.  Instead of the dissolving one.  It's a lot cheaper for the reg. It is what she was taking.  Please call pharmacy back [phone]  Thanks [place]

## 2015-02-20 ENCOUNTER — Other Ambulatory Visit: Payer: Self-pay

## 2015-02-20 DIAGNOSIS — F419 Anxiety disorder, unspecified: Principal | ICD-10-CM

## 2015-02-20 DIAGNOSIS — F329 Major depressive disorder, single episode, unspecified: Secondary | ICD-10-CM

## 2015-02-20 MED ORDER — CLONAZEPAM 1 MG PO TABS: 1.0000 mg | ORAL_TABLET | Freq: Every day | ORAL | Status: DC

## 2015-02-20 NOTE — Telephone Encounter (Signed)
May change Klonopin to the 1mg  regular tablet. Advise her pharmacy.

## 2015-02-20 NOTE — Telephone Encounter (Signed)
Please advise 

## 2015-02-20 NOTE — Telephone Encounter (Signed)
Per [facility] pharmacy to change Clonazepam RX to regular tablets instead of dissolvable tablets.

## 2015-02-27 DIAGNOSIS — Z961 Presence of intraocular lens: Secondary | ICD-10-CM | POA: Diagnosis not present

## 2015-03-20 DIAGNOSIS — Z23 Encounter for immunization: Secondary | ICD-10-CM | POA: Diagnosis not present

## 2015-03-25 ENCOUNTER — Other Ambulatory Visit: Payer: Self-pay | Admitting: Family Medicine

## 2015-03-25 MED ORDER — ALENDRONATE SODIUM 70 MG PO TABS: 70.0000 mg | ORAL_TABLET | ORAL | Status: DC

## 2015-03-25 MED ORDER — LOVASTATIN 40 MG PO TABS: 40.0000 mg | ORAL_TABLET | Freq: Every day | ORAL | Status: DC

## 2015-04-24 DIAGNOSIS — E782 Mixed hyperlipidemia: Secondary | ICD-10-CM | POA: Diagnosis not present

## 2015-04-24 DIAGNOSIS — R072 Precordial pain: Secondary | ICD-10-CM | POA: Diagnosis not present

## 2015-05-06 ENCOUNTER — Telehealth: Payer: Self-pay | Admitting: Family Medicine

## 2015-05-06 NOTE — Telephone Encounter (Signed)
Schedule follow up appointment to recheck progress and refill medications. Can get labs set up for the same day when in the office.

## 2015-05-06 NOTE — Telephone Encounter (Signed)
Please review

## 2015-05-06 NOTE — Telephone Encounter (Signed)
Pt is requesting a lab slip b/c she stated her labs are due in order for her to be able to get her Klonopin refilled. Pt stated she has 13 pills left  And if she needs an OV to get the refill she would like to know asap b/c she doesn't want to run out. Please advise. Thanks TNP

## 2015-05-07 DIAGNOSIS — K222 Esophageal obstruction: Secondary | ICD-10-CM

## 2015-05-07 DIAGNOSIS — M810 Age-related osteoporosis without current pathological fracture: Secondary | ICD-10-CM

## 2015-05-07 DIAGNOSIS — M1990 Unspecified osteoarthritis, unspecified site: Secondary | ICD-10-CM | POA: Insufficient documentation

## 2015-05-07 DIAGNOSIS — E7849 Other hyperlipidemia: Secondary | ICD-10-CM | POA: Insufficient documentation

## 2015-05-07 DIAGNOSIS — E782 Mixed hyperlipidemia: Secondary | ICD-10-CM

## 2015-05-07 DIAGNOSIS — F418 Other specified anxiety disorders: Secondary | ICD-10-CM | POA: Insufficient documentation

## 2015-05-07 DIAGNOSIS — Z86010 Personal history of colonic polyps: Secondary | ICD-10-CM

## 2015-05-07 DIAGNOSIS — K219 Gastro-esophageal reflux disease without esophagitis: Secondary | ICD-10-CM | POA: Insufficient documentation

## 2015-05-07 DIAGNOSIS — Z85038 Personal history of other malignant neoplasm of large intestine: Secondary | ICD-10-CM

## 2015-05-07 DIAGNOSIS — K5909 Other constipation: Secondary | ICD-10-CM | POA: Insufficient documentation

## 2015-05-07 DIAGNOSIS — Z85048 Personal history of other malignant neoplasm of rectum, rectosigmoid junction, and anus: Secondary | ICD-10-CM | POA: Insufficient documentation

## 2015-05-07 NOTE — Telephone Encounter (Signed)
Patient advised as directed below. Patient verbalized understanding. Patient scheduled for a follow up appointment on 05/12/2015.

## 2015-05-12 ENCOUNTER — Ambulatory Visit: Payer: Self-pay | Admitting: Family Medicine

## 2015-05-12 ENCOUNTER — Ambulatory Visit (INDEPENDENT_AMBULATORY_CARE_PROVIDER_SITE_OTHER): Payer: Medicare Other | Admitting: Family Medicine

## 2015-05-12 ENCOUNTER — Encounter: Payer: Self-pay | Admitting: Family Medicine

## 2015-05-12 VITALS — BP 118/64 | HR 75 | Temp 97.7°F | Resp 16 | Wt 121.0 lb

## 2015-05-12 DIAGNOSIS — F418 Other specified anxiety disorders: Secondary | ICD-10-CM

## 2015-05-12 DIAGNOSIS — E782 Mixed hyperlipidemia: Secondary | ICD-10-CM | POA: Diagnosis not present

## 2015-05-12 MED ORDER — CLONAZEPAM 1 MG PO TABS: 1.0000 mg | ORAL_TABLET | Freq: Every day | ORAL | Status: DC

## 2015-05-12 NOTE — Progress Notes (Signed)
Patient ID: Ann Young, female   DOB: 1941/05/15, 74 y.o.   MRN: [id number]    Subjective:  Hyperlipidemia This is a chronic problem. The current episode started more than 1 year ago. The problem is controlled. She has no history of chronic renal disease, liver disease or obesity. Pertinent negatives include no chest pain, leg pain, myalgias or shortness of breath. Current antihyperlipidemic treatment includes statins. There are no compliance problems.   Anxiety Presents for follow-up visit. Symptoms include excessive worry and nervous/anxious behavior. Patient reports no chest pain, hyperventilation, shortness of breath or suicidal ideas. Primary symptoms comment: some occasional crying spells. Symptoms occur occasionally. The severity of symptoms is moderate.   Her past medical history is significant for anxiety/panic attacks. Past treatments include SSRIs and benzodiazephines. The treatment provided significant relief. Compliance with prior treatments has been good.     Prior to Admission medications   Medication Sig Start Date End Date Taking? Authorizing Provider  alendronate (FOSAMAX) 70 MG tablet Take 70 mg by mouth once a week. Take with a full glass of water on an empty stomach.   Yes Historical Provider, MD  aspirin 81 MG tablet Take 81 mg by mouth daily.   Yes Historical Provider, MD  Calcium Carb-Cholecalciferol (CALCIUM 600 + D PO) Take by mouth 2 (two) times daily.   Yes Historical Provider, MD  cetirizine (ZYRTEC) 10 MG tablet Take 10 mg by mouth daily.   Yes Historical Provider, MD  citalopram (CELEXA) 40 MG tablet Take 40 mg by mouth daily.   Yes Historical Provider, MD  clonazePAM (KLONOPIN) 1 MG tablet Take 1 mg by mouth at bedtime.   Yes Historical Provider, MD  fluticasone (FLONASE) 50 MCG/ACT nasal spray Place 1 spray into both nostrils daily.   Yes Historical Provider, MD  lovastatin (MEVACOR) 40 MG tablet Take 40 mg by mouth at bedtime.   Yes Historical Provider, MD    Multiple Vitamins-Minerals (MULTIVITAMIN PO) Take by mouth.   Yes Historical Provider, MD  Omega-3 Fatty Acids (FISH OIL PO) Take by mouth.   Yes Historical Provider, MD  omeprazole (PRILOSEC) 40 MG capsule Take 40 mg by mouth daily.   Yes Historical Provider, MD  ranitidine (ZANTAC) 150 MG tablet Take 150 mg by mouth daily.   Yes Historical Provider, MD  Vitamin D, Cholecalciferol, 1000 UNITS CAPS Take 1 capsule by mouth daily.   Yes Historical Provider, MD   Past Surgical History  Procedure Laterality Date  . Tubal ligation  1976  . Breast biopsy Left 2000    benign  . Cardiac catheterization  2015    WNL  . Cataract extraction Bilateral 2013  . Resection of colorectal cancer without sequela     Family History  Problem Relation Age of Onset  . Leukemia Mother   . Depression Mother   . Heart disease Father   . Depression Father   . Bipolar disorder Daughter     Patient Active Problem List   Diagnosis Date Noted  . Osteoporosis 05/07/2015  . Constipation, chronic 05/07/2015  . History of colorectal cancer 05/07/2015  . Depression with anxiety 05/07/2015  . Arthritis 05/07/2015  . Mixed hyperlipidemia 05/07/2015  . History of colon polyps 05/07/2015  . GERD with stricture 05/07/2015    Social History   Social History  . Marital Status: Married    Spouse Name: N/A  . Number of Children: N/A  . Years of Education: N/A   Occupational History  . Not on file.  Social History Main Topics  . Smoking status: Never Smoker   . Smokeless tobacco: Never Used  . Alcohol Use: No  . Drug Use: No  . Sexual Activity: Not on file   Other Topics Concern  . Not on file   Social History Narrative    Allergies  Allergen Reactions  . Penicillins   . Sulfa Antibiotics     Review of Systems  Constitutional: Negative.   HENT: Negative.   Eyes: Negative.   Respiratory: Negative.  Negative for shortness of breath.   Cardiovascular: Negative.  Negative for chest pain.   Gastrointestinal: Negative.   Genitourinary: Negative.   Musculoskeletal: Negative.  Negative for myalgias.  Skin: Negative.   Endo/Heme/Allergies: Negative.   Psychiatric/Behavioral: Negative for suicidal ideas. The patient is nervous/anxious.     Immunization History  Administered Date(s) Administered  . Influenza-Unspecified 03/20/2015   Objective:  BP 118/64 mmHg  Pulse 75  Temp(Src) 97.7 F (36.5 C) (Oral)  Resp 16  Wt 121 lb (54.885 kg)  SpO2 94%  Physical Exam  Constitutional: She is oriented to person, place, and time and well-developed, well-nourished, and in no distress.  HENT:  Head: Normocephalic.  Eyes: Conjunctivae and EOM are normal.  Neck: Neck supple.  Cardiovascular: Normal rate and regular rhythm.   Pulmonary/Chest: Effort normal and breath sounds normal.  Abdominal: Bowel sounds are normal.  Musculoskeletal: Normal range of motion.  Neurological: She is alert and oriented to person, place, and time.  Psychiatric: Affect normal. Her mood appears anxious. She expresses no suicidal plans.      Assessment and Plan :  1. Mixed hyperlipidemia Tolerating Lovastatin and low fat diet without side effects. Will recheck labs and follow up ain 3-6 months pending results. - CBC with Differential/Platelet - COMPLETE METABOLIC PANEL WITH GFR - Lipid panel - TSH  2. Depression with anxiety Still has some "nervousness" when she is inactive. Occasional crying spells. The clonazepam helps calm anxiety and help her sleep. Unsure if the citalopram helps much during the day. Controlled well enough to accomplish all tasks. Refill clonazepam and get follow up labs. If worsening, may need referral back to psychiatrist. - clonazePAM (KLONOPIN) 1 MG tablet; Take 1 tablet (1 mg total) by mouth at bedtime.  Dispense: 90 tablet; Refill: [facility] 05/12/2015 8:27 AM

## 2015-05-13 LAB — COMPREHENSIVE METABOLIC PANEL
A/G RATIO: 2.1 (ref 1.1–2.5)
ALBUMIN: 4.4 g/dL (ref 3.5–4.8)
ALT: 22 IU/L (ref 0–32)
AST: 21 IU/L (ref 0–40)
Alkaline Phosphatase: 40 IU/L (ref 39–117)
BUN / CREAT RATIO: 24 (ref 11–26)
BUN: 17 mg/dL (ref 8–27)
Bilirubin Total: 0.3 mg/dL (ref 0.0–1.2)
CALCIUM: 9.3 mg/dL (ref 8.7–10.3)
CO2: 30 mmol/L — ABNORMAL HIGH (ref 18–29)
Chloride: 102 mmol/L (ref 97–106)
Creatinine, Ser: 0.71 mg/dL (ref 0.57–1.00)
GFR, EST AFRICAN AMERICAN: 98 mL/min/{1.73_m2} (ref >59)
GFR, EST NON AFRICAN AMERICAN: 85 mL/min/{1.73_m2} (ref >59)
Globulin, Total: 2.1 g/dL (ref 1.5–4.5)
Glucose: 93 mg/dL (ref 65–99)
POTASSIUM: 4.9 mmol/L (ref 3.5–5.2)
Sodium: 144 mmol/L (ref 136–144)
TOTAL PROTEIN: 6.5 g/dL (ref 6.0–8.5)

## 2015-05-13 LAB — CBC WITH DIFFERENTIAL/PLATELET
BASOS ABS: 0.1 10*3/uL (ref 0.0–0.2)
Basos: 1 %
EOS (ABSOLUTE): 0.1 10*3/uL (ref 0.0–0.4)
Eos: 3 %
Hematocrit: 37.7 % (ref 34.0–46.6)
Hemoglobin: 12.4 g/dL (ref 11.1–15.9)
IMMATURE GRANS (ABS): 0 10*3/uL (ref 0.0–0.1)
IMMATURE GRANULOCYTES: 0 %
LYMPHS: 37 %
Lymphocytes Absolute: 1.9 10*3/uL (ref 0.7–3.1)
MCH: 31.9 pg (ref 26.6–33.0)
MCHC: 32.9 g/dL (ref 31.5–35.7)
MCV: 97 fL (ref 79–97)
MONOS ABS: 0.5 10*3/uL (ref 0.1–0.9)
Monocytes: 9 %
NEUTROS PCT: 50 %
Neutrophils Absolute: 2.6 10*3/uL (ref 1.4–7.0)
PLATELETS: 160 10*3/uL (ref 150–379)
RBC: 3.89 x10E6/uL (ref 3.77–5.28)
RDW: 12.5 % (ref 12.3–15.4)
WBC: 5.1 10*3/uL (ref 3.4–10.8)

## 2015-05-13 LAB — LIPID PANEL
Chol/HDL Ratio: 2.9 ratio units (ref 0.0–4.4)
Cholesterol, Total: 190 mg/dL (ref 100–199)
HDL: 66 mg/dL (ref >39)
LDL Calculated: 106 mg/dL — ABNORMAL HIGH (ref 0–99)
Triglycerides: 91 mg/dL (ref 0–149)
VLDL Cholesterol Cal: 18 mg/dL (ref 5–40)

## 2015-05-13 LAB — TSH: TSH: 2.55 u[IU]/mL (ref 0.450–4.500)

## 2015-05-14 ENCOUNTER — Telehealth: Payer: Self-pay

## 2015-05-14 NOTE — Telephone Encounter (Signed)
-----   Message from Margo Common, [place] sent at 05/13/2015  6:14 PM EST ----- Blood tests essentially normal. Continue present medications and recheck in 4 months.

## 2015-05-14 NOTE — Telephone Encounter (Signed)
Pt advised as directed below.   Thanks,   -Maeryn Mcgath  

## 2015-06-30 ENCOUNTER — Other Ambulatory Visit: Payer: Self-pay | Admitting: Family Medicine

## 2015-06-30 NOTE — Telephone Encounter (Signed)
[place] patient.   Last OV: 05/12/2015   Last Refill: 11/04/2014

## 2015-07-14 ENCOUNTER — Encounter: Payer: Self-pay | Admitting: Family Medicine

## 2015-07-29 DIAGNOSIS — L820 Inflamed seborrheic keratosis: Secondary | ICD-10-CM | POA: Diagnosis not present

## 2015-07-29 DIAGNOSIS — L821 Other seborrheic keratosis: Secondary | ICD-10-CM | POA: Diagnosis not present

## 2015-08-18 ENCOUNTER — Encounter: Payer: Self-pay | Admitting: Family Medicine

## 2015-08-18 ENCOUNTER — Ambulatory Visit (INDEPENDENT_AMBULATORY_CARE_PROVIDER_SITE_OTHER): Payer: Medicare Other | Admitting: Family Medicine

## 2015-08-18 VITALS — BP 104/60 | HR 67 | Temp 97.8°F | Resp 16 | Wt 121.2 lb

## 2015-08-18 DIAGNOSIS — Z85038 Personal history of other malignant neoplasm of large intestine: Secondary | ICD-10-CM | POA: Diagnosis not present

## 2015-08-18 DIAGNOSIS — E782 Mixed hyperlipidemia: Secondary | ICD-10-CM | POA: Diagnosis not present

## 2015-08-18 DIAGNOSIS — K222 Esophageal obstruction: Secondary | ICD-10-CM

## 2015-08-18 DIAGNOSIS — F32A Depression, unspecified: Secondary | ICD-10-CM

## 2015-08-18 DIAGNOSIS — F329 Major depressive disorder, single episode, unspecified: Secondary | ICD-10-CM

## 2015-08-18 DIAGNOSIS — Z85048 Personal history of other malignant neoplasm of rectum, rectosigmoid junction, and anus: Secondary | ICD-10-CM

## 2015-08-18 DIAGNOSIS — K219 Gastro-esophageal reflux disease without esophagitis: Secondary | ICD-10-CM | POA: Diagnosis not present

## 2015-08-18 DIAGNOSIS — F418 Other specified anxiety disorders: Secondary | ICD-10-CM

## 2015-08-18 DIAGNOSIS — Z889 Allergy status to unspecified drugs, medicaments and biological substances status: Secondary | ICD-10-CM | POA: Insufficient documentation

## 2015-08-18 DIAGNOSIS — F419 Anxiety disorder, unspecified: Secondary | ICD-10-CM

## 2015-08-18 MED ORDER — CLONAZEPAM 1 MG PO TABS: 1.0000 mg | ORAL_TABLET | Freq: Every day | ORAL | Status: DC

## 2015-08-18 MED ORDER — FEXOFENADINE HCL 60 MG PO TABS: 60.0000 mg | ORAL_TABLET | Freq: Two times a day (BID) | ORAL | Status: DC

## 2015-08-18 MED ORDER — LOVASTATIN 40 MG PO TABS: 40.0000 mg | ORAL_TABLET | Freq: Every day | ORAL | Status: DC

## 2015-08-18 MED ORDER — OMEPRAZOLE 20 MG PO CPDR: 20.0000 mg | DELAYED_RELEASE_CAPSULE | Freq: Two times a day (BID) | ORAL | Status: DC

## 2015-08-18 NOTE — Progress Notes (Signed)
Patient ID: Ann Young, female   DOB: 25-Jul-1940, 75 y.o.   MRN: [id number]   Patient: Ann Young Female    DOB: 1941-03-31   75 y.o.   MRN: [id number] Visit Date: 08/18/2015  Today's Provider: Vernie Murders, PA   Chief Complaint  Patient presents with  . Anxiety  . Hyperlipidemia  . Follow-up   Subjective:    Anxiety Presents for follow-up visit. Symptoms include nervous/anxious behavior. Patient reports no chest pain or shortness of breath. Primary symptoms comment: loss of interest. The severity of symptoms is moderate.   Her past medical history is significant for anxiety/panic attacks. Past treatments include benzodiazephines and SSRIs.  Hyperlipidemia This is a chronic problem. The current episode started more than 1 year ago. The problem is controlled. She has no history of chronic renal disease, liver disease or obesity. Pertinent negatives include no chest pain, leg pain, myalgias or shortness of breath. Current antihyperlipidemic treatment includes statins. There are no compliance problems.    History reviewed. No pertinent past medical history. Patient Active Problem List   Diagnosis Date Noted  . History of allergy 08/18/2015  . Osteoporosis 05/07/2015  . Constipation, chronic 05/07/2015  . History of colorectal cancer 05/07/2015  . Depression with anxiety 05/07/2015  . Arthritis 05/07/2015  . Mixed hyperlipidemia 05/07/2015  . History of colon polyps 05/07/2015  . GERD with stricture 05/07/2015  . Low back pain 02/10/2015  . Problems influencing health status 10/28/2014  . Arthritis 10/28/2014  . Malignant neoplasm ([place]) 10/28/2014  . History of colonic polyps 10/28/2014  . Chronic constipation 10/28/2014  . Anxiety and depression 10/28/2014  . Acid reflux 10/28/2014  . History of malignant neoplasm of large intestine 10/28/2014  . 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 OP (osteoporosis) 10/28/2014  . Gastric ulcer  10/28/2014  . 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 Awareness of heartbeats 03/25/2014   Past Surgical History  Procedure Laterality Date  . Resection of colorectal cancer without sequela  1999  . Cataract extraction, bilateral Bilateral 2013    Dr. Willy Eddy  . Tubal ligation  1976  . Breast biopsy Left 2000    benign  . Cardiac catheterization  2015    WNL  . Cataract extraction Bilateral 2013   Family History  Problem Relation Age of Onset  . Cirrhosis Sister   . Colon polyps Sister   . Leukemia Mother   . Depression Mother   . Heart disease Father   . Depression Father   . Bipolar disorder Daughter      Previous Medications   ALENDRONATE (FOSAMAX) 70 MG TABLET    Take 70 mg by mouth once a week. Reported on 08/18/2015   ASPIRIN 81 MG TABLET    Take 81 mg by mouth daily.   CALCIUM CARB-CHOLECALCIFEROL 600-200 MG-UNIT TABS    Take 1 tablet by mouth 2 (two) times daily.   CETIRIZINE (ZYRTEC) 10 MG TABLET    Take 10 mg by mouth daily.   CITALOPRAM (CELEXA) 40 MG TABLET    Take 40 mg by mouth daily.   CLONAZEPAM (KLONOPIN) 1 MG TABLET    Take 1 tablet (1 mg total) by mouth at bedtime.   FLUTICASONE (FLONASE) 50 MCG/ACT NASAL SPRAY    Place 1 spray into both nostrils daily.   LOVASTATIN (MEVACOR) 40 MG TABLET    Take 40 mg by mouth at bedtime.   MULTIPLE VITAMINS-MINERALS (MULTIVITAMIN PO)    Take by mouth.  OMEGA-3 FATTY ACIDS (FISH OIL PO)    Take by mouth.   OMEPRAZOLE (PRILOSEC) 40 MG CAPSULE    TAKE ONE CAPSULE BY MOUTH ONCE DAILY   RANITIDINE (ZANTAC) 150 MG TABLET    Take 150 mg by mouth daily.   VITAMIN D, CHOLECALCIFEROL, 1000 UNITS CAPS    Take 1 capsule by mouth daily.   Allergies  Allergen Reactions  . Clarithromycin Other (See Comments)    Questionable Biaxin vs Flagyl with chest discomfort & irregular heartbeat (most likely thought to be related to the Biaxin)  . Ibuprofen Other (See Comments)    To prevent swelling,increase hr/bp    . Lipitor  [Atorvastatin] Other (See Comments)    Mayalgia  . Metronidazole Other (See Comments)    Questionable Biaxin vs Flagyl with chest discomfort & irregular heartbeat (most likely thought to be related to the Biaxin)  . Other Other (See Comments)    decongestants  . Penicillins   . Simvastatin Other (See Comments)    Hari Loss  . Sulfa Antibiotics   . Toviaz  [Fesoterodine Fumarate Er] Other (See Comments)    weakness    Review of Systems  Constitutional: Positive for activity change.  HENT: Negative.   Eyes: Negative.   Respiratory: Negative.  Negative for shortness of breath.   Cardiovascular: Negative for chest pain.  Gastrointestinal: Positive for constipation. Negative for blood in stool.       Some dysphagia with dry foods and dyspepsia with GERD. History of PUD and was positive for H.pylori in the past.  Endocrine: Negative.   Genitourinary: Negative.   Musculoskeletal: Negative.  Negative for myalgias.  Skin: Negative.   Allergic/Immunologic: Negative.   Neurological: Negative.   Hematological: Negative.   Psychiatric/Behavioral: The patient is nervous/anxious.     Social History  Substance Use Topics  . Smoking status: Never Smoker   . Smokeless tobacco: Never Used  . Alcohol Use: No   Objective:   BP 104/60 mmHg  Pulse 67  Temp(Src) 97.8 F (36.6 C) (Oral)  Resp 16  Wt 121 lb 3.2 oz (54.976 kg)  SpO2 97%  Physical Exam  Constitutional: She is oriented to person, place, and time. She appears well-developed and well-nourished.  HENT:  Head: Normocephalic.  Right Ear: External ear normal.  Left Ear: External ear normal.  Mouth/Throat: Oropharynx is clear and moist.  Eyes: EOM are normal.  Pale membrane  Neck: Normal range of motion. Neck supple.  Cardiovascular: Normal rate, regular rhythm and normal heart sounds.   Pulmonary/Chest: Effort normal and breath sounds normal.  Abdominal: Soft. Bowel sounds are normal.  Musculoskeletal: Normal  range of motion.  Neurological: She is alert and oriented to person, place, and time.  Psychiatric: She has a normal mood and affect. Her behavior is normal.      Assessment & Plan:     1. GERD with stricture History of bleeding peptic ulcer in 1972 with positive H. Pylori test and esophageal stricture. Having more trouble swallowing recently with dyspepsia. Will get routine labs and refill Omeprazole (wants to take a smaller dose twice a day now). Recommend referral to gastroenterologist to consider upper endoscopy and possible dilation of esophagus if no better with use of PPI. - CBC with Differential/Platelet - omeprazole (PRILOSEC) 20 MG capsule; Take 1 capsule (20 mg total) by mouth 2 (two) times daily.  Dispense: 180 capsule; Refill: 3  2. Anxiety and depression Anxiety becoming more persistent with panic sensations despite use of Clonazepam 1  mg qd and Citalopram 40 mg qd. Recommend with worsening panic and anxiety will refer to psychiatrist for evaluation, psychotherapy and medication adjustments. - clonazePAM (KLONOPIN) 1 MG tablet; Take 1 tablet (1 mg total) by mouth at bedtime.  Dispense: 90 tablet; Refill: 0 - Ambulatory referral to Psychiatry  3. History of colorectal cancer Partial colectomy in 1999 at [facility] without hematochezia or melena. Has problems with constipation (small pellet stools every time she urinates. Recommend follow up with gastroenterologist (Dr. Vira Agar has done past follow up colonoscopies).  4. Mixed hyperlipidemia Tolerating Lovastatin without muscle pains. Will recheck lipids and CMP with TSH. Continue low fat diet and follow up pending report. - Comprehensive metabolic panel - Lipid panel - TSH - lovastatin (MEVACOR) 40 MG tablet; Take 1 tablet (40 mg total) by mouth at bedtime.  Dispense: 90 tablet; Refill: 3  5. History of allergy Doesn't feel the Zyrtec and Flonase is controlling allergies well recently. Will stop the Zyrtec and switch to Allegra.  Continue Flonase and recheck prn.- fexofenadine (HM FEXOFENADINE HCL) 60 MG tablet; Take 1 tablet (60 mg total) by mouth 2 (two) times daily.  Dispense: 60 tablet; Refill: 3

## 2015-08-19 LAB — CBC WITH DIFFERENTIAL/PLATELET
BASOS ABS: 0 10*3/uL (ref 0.0–0.2)
Basos: 0 %
EOS (ABSOLUTE): 0.1 10*3/uL (ref 0.0–0.4)
EOS: 2 %
HEMATOCRIT: 38.6 % (ref 34.0–46.6)
HEMOGLOBIN: 13 g/dL (ref 11.1–15.9)
IMMATURE GRANS (ABS): 0 10*3/uL (ref 0.0–0.1)
Immature Granulocytes: 0 %
LYMPHS ABS: 2.1 10*3/uL (ref 0.7–3.1)
LYMPHS: 40 %
MCH: 31.6 pg (ref 26.6–33.0)
MCHC: 33.7 g/dL (ref 31.5–35.7)
MCV: 94 fL (ref 79–97)
MONOCYTES: 9 %
Monocytes Absolute: 0.5 10*3/uL (ref 0.1–0.9)
NEUTROS ABS: 2.5 10*3/uL (ref 1.4–7.0)
Neutrophils: 49 %
Platelets: 159 10*3/uL (ref 150–379)
RBC: 4.11 x10E6/uL (ref 3.77–5.28)
RDW: 12.8 % (ref 12.3–15.4)
WBC: 5.3 10*3/uL (ref 3.4–10.8)

## 2015-08-19 LAB — LIPID PANEL
CHOL/HDL RATIO: 2.5 ratio (ref 0.0–4.4)
Cholesterol, Total: 192 mg/dL (ref 100–199)
HDL: 78 mg/dL (ref >39)
LDL CALC: 101 mg/dL — AB (ref 0–99)
Triglycerides: 64 mg/dL (ref 0–149)
VLDL CHOLESTEROL CAL: 13 mg/dL (ref 5–40)

## 2015-08-19 LAB — COMPREHENSIVE METABOLIC PANEL
ALBUMIN: 4.6 g/dL (ref 3.5–4.8)
ALT: 16 IU/L (ref 0–32)
AST: 16 IU/L (ref 0–40)
Albumin/Globulin Ratio: 2.1 (ref 1.1–2.5)
Alkaline Phosphatase: 42 IU/L (ref 39–117)
BUN / CREAT RATIO: 21 (ref 11–26)
BUN: 16 mg/dL (ref 8–27)
Bilirubin Total: 0.4 mg/dL (ref 0.0–1.2)
CO2: 28 mmol/L (ref 18–29)
CREATININE: 0.78 mg/dL (ref 0.57–1.00)
Calcium: 9.6 mg/dL (ref 8.7–10.3)
Chloride: 101 mmol/L (ref 96–106)
GFR calc non Af Amer: 76 mL/min/{1.73_m2} (ref >59)
GFR, EST AFRICAN AMERICAN: 88 mL/min/{1.73_m2} (ref >59)
GLOBULIN, TOTAL: 2.2 g/dL (ref 1.5–4.5)
GLUCOSE: 90 mg/dL (ref 65–99)
Potassium: 4.6 mmol/L (ref 3.5–5.2)
SODIUM: 144 mmol/L (ref 134–144)
TOTAL PROTEIN: 6.8 g/dL (ref 6.0–8.5)

## 2015-08-19 LAB — TSH: TSH: 2.64 u[IU]/mL (ref 0.450–4.500)

## 2015-08-25 ENCOUNTER — Telehealth: Payer: Self-pay | Admitting: Family Medicine

## 2015-08-25 DIAGNOSIS — F419 Anxiety disorder, unspecified: Secondary | ICD-10-CM

## 2015-08-25 DIAGNOSIS — F329 Major depressive disorder, single episode, unspecified: Secondary | ICD-10-CM

## 2015-08-25 MED ORDER — SERTRALINE HCL 50 MG PO TABS: 50.0000 mg | ORAL_TABLET | Freq: Every day | ORAL | Status: DC

## 2015-08-25 NOTE — Telephone Encounter (Signed)
Patient advised as directed below. Patient verbalized understanding and agrees with plan of care.  

## 2015-08-25 NOTE — Telephone Encounter (Signed)
Please review

## 2015-08-25 NOTE — Telephone Encounter (Signed)
Pt stated that she doesn't want to see a psychiatrist and wanted to know if Simona Huh could provide her with something in place of citalopram (CELEXA) 40 MG tablet that would be ok to take with omeprazole (PRILOSEC) 20 MG capsule. [facility] [employer]. Thanks TNP

## 2015-08-25 NOTE — Telephone Encounter (Signed)
Will switch citalopram to sertraline to prevent interaction with omeprazole (sent to her pharmacy on file). Call in 3-4 weeks to give report of progress with change.

## 2015-09-02 DIAGNOSIS — K219 Gastro-esophageal reflux disease without esophagitis: Secondary | ICD-10-CM | POA: Diagnosis not present

## 2015-09-02 DIAGNOSIS — R1013 Epigastric pain: Secondary | ICD-10-CM | POA: Diagnosis not present

## 2015-09-02 DIAGNOSIS — R1319 Other dysphagia: Secondary | ICD-10-CM | POA: Diagnosis not present

## 2015-09-02 DIAGNOSIS — Z85038 Personal history of other malignant neoplasm of large intestine: Secondary | ICD-10-CM | POA: Diagnosis not present

## 2015-09-02 DIAGNOSIS — Z8619 Personal history of other infectious and parasitic diseases: Secondary | ICD-10-CM | POA: Diagnosis not present

## 2015-09-11 ENCOUNTER — Ambulatory Visit: Payer: Self-pay | Admitting: Family Medicine

## 2015-10-01 ENCOUNTER — Telehealth: Payer: Self-pay

## 2015-10-01 DIAGNOSIS — F329 Major depressive disorder, single episode, unspecified: Secondary | ICD-10-CM

## 2015-10-01 DIAGNOSIS — F419 Anxiety disorder, unspecified: Secondary | ICD-10-CM

## 2015-10-01 DIAGNOSIS — F32A Depression, unspecified: Secondary | ICD-10-CM

## 2015-10-01 NOTE — Telephone Encounter (Signed)
Patient called saying that she would like to proceed with the psychiatry referral. She reports that the medication that she is on is not helping her symptoms. She would like to see Dr. Nicolasa Ducking ASAP. Please contact patient when referral is complete. Thanks!

## 2015-10-02 NOTE — Telephone Encounter (Signed)
Proceed with psychiatry referral. Anxiety and depression worsening despite Clonazepam and Sertraline regimen. Requests Dr. Nicolasa Ducking if available.

## 2015-10-02 NOTE — Telephone Encounter (Signed)
Judson Roch will contact patient with date/time of appointment with Dr. Nicolasa Ducking.

## 2015-10-06 ENCOUNTER — Ambulatory Visit
Admission: RE | Admit: 2015-10-06 | Payer: Medicare Other | Source: Ambulatory Visit | Admitting: Unknown Physician Specialty

## 2015-10-06 ENCOUNTER — Encounter: Admission: RE | Payer: Self-pay | Source: Ambulatory Visit

## 2015-10-06 SURGERY — ESOPHAGOGASTRODUODENOSCOPY (EGD) WITH PROPOFOL
Anesthesia: General

## 2015-11-04 DIAGNOSIS — F411 Generalized anxiety disorder: Secondary | ICD-10-CM | POA: Diagnosis not present

## 2015-11-04 DIAGNOSIS — F330 Major depressive disorder, recurrent, mild: Secondary | ICD-10-CM | POA: Diagnosis not present

## 2015-11-20 IMAGING — MG MM DIGITAL SCREENING BILAT W/ TOMO W/ CAD
8 series · 8 of 8 positions shown · non-contrast
Comparison: Previous exam(s).

CLINICAL DATA: Screening.

EXAM:
DIGITAL SCREENING BILATERAL MAMMOGRAM WITH 3D TOMO WITH CAD

[R CC]
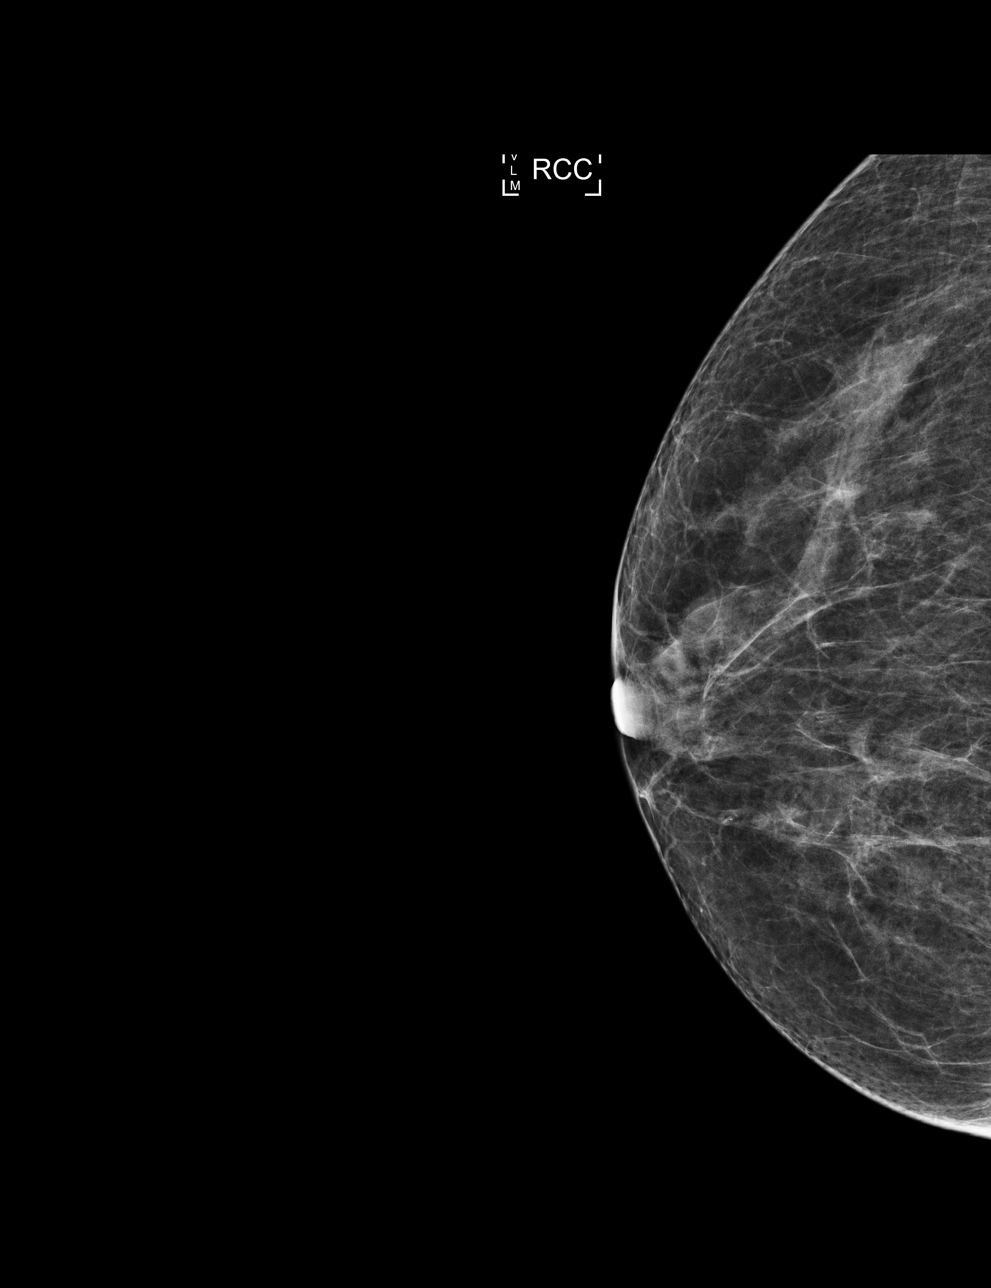

[L MLO]
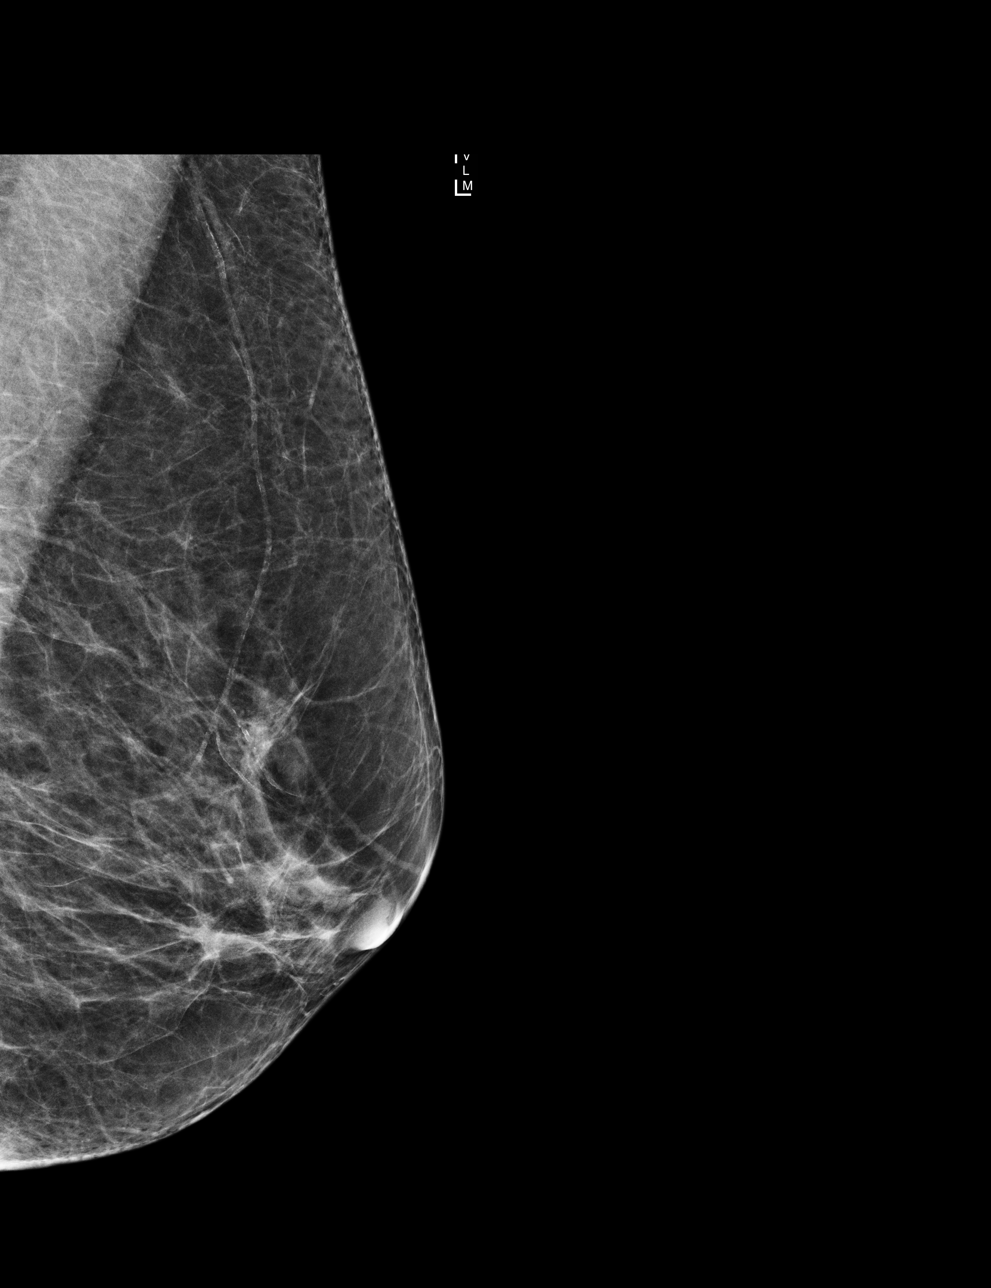

[R MLO]
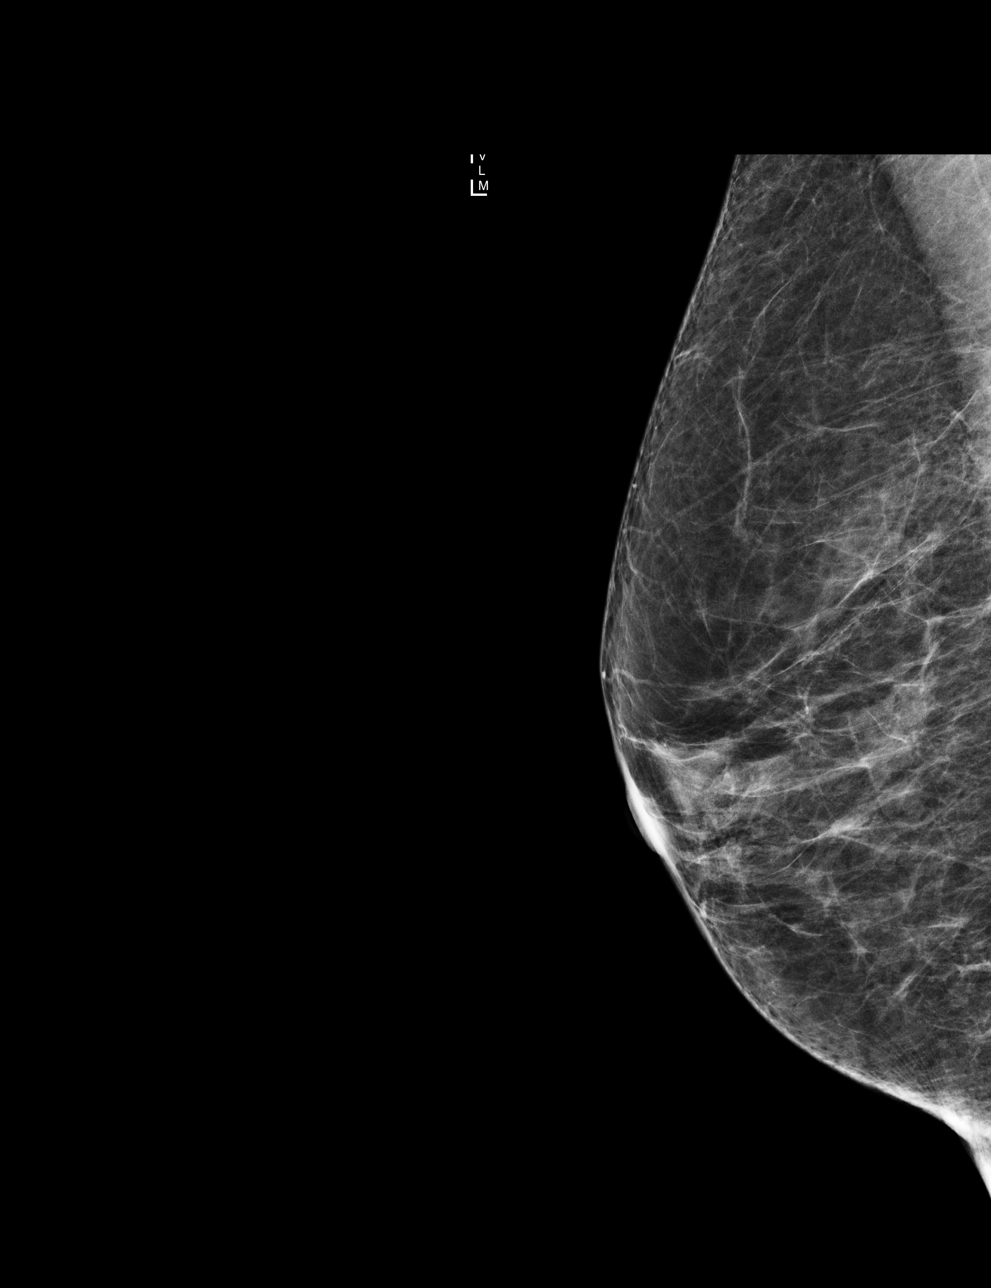

[L CC]
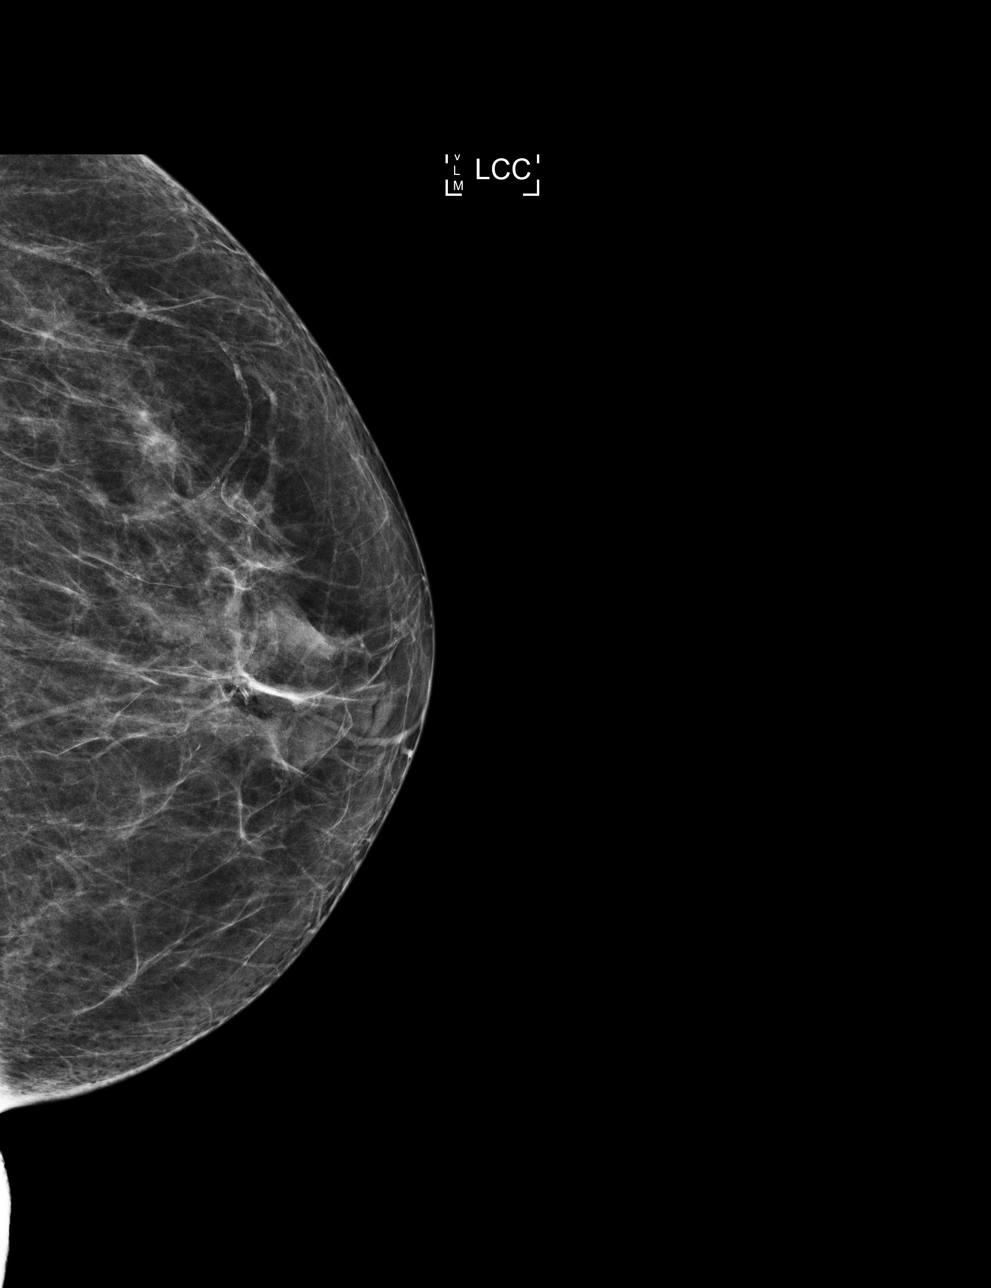

[L CC tomo]
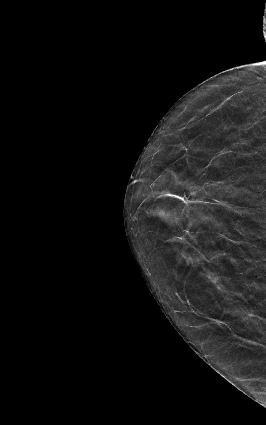

[L MLO tomo]
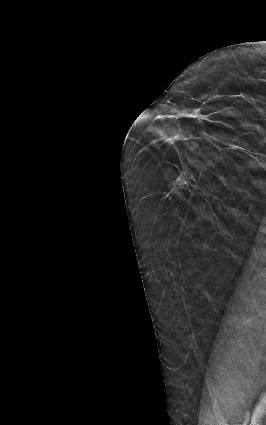

[R MLO tomo]
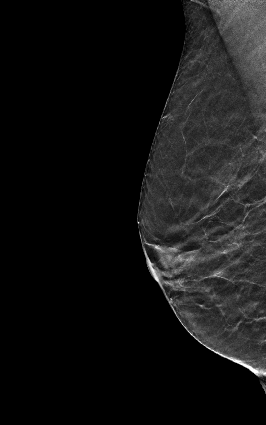

[R CC tomo]
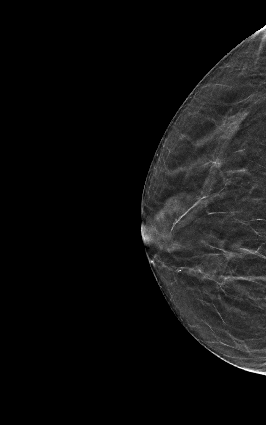

[8 of 8 positions shown; findings below may reference images not displayed]

ACR Breast Density Category b: There are scattered areas of
fibroglandular density.
FINDINGS: There are no findings suspicious for malignancy. Images were
processed with CAD.
IMPRESSION: No mammographic evidence of malignancy. A result letter of this
screening mammogram will be mailed directly to the patient.

RECOMMENDATION:
Screening mammogram in one year. (Code:55-L-23V)

BI-RADS CATEGORY  1: Negative.

## 2015-11-24 ENCOUNTER — Ambulatory Visit: Payer: Self-pay | Admitting: Family Medicine

## 2015-12-08 DIAGNOSIS — F330 Major depressive disorder, recurrent, mild: Secondary | ICD-10-CM | POA: Diagnosis not present

## 2015-12-08 DIAGNOSIS — F411 Generalized anxiety disorder: Secondary | ICD-10-CM | POA: Diagnosis not present

## 2015-12-29 DIAGNOSIS — F411 Generalized anxiety disorder: Secondary | ICD-10-CM | POA: Diagnosis not present

## 2015-12-29 DIAGNOSIS — F330 Major depressive disorder, recurrent, mild: Secondary | ICD-10-CM | POA: Diagnosis not present

## 2016-01-01 ENCOUNTER — Ambulatory Visit: Payer: Self-pay | Admitting: Family Medicine

## 2016-01-02 ENCOUNTER — Encounter: Payer: Self-pay | Admitting: Family Medicine

## 2016-01-02 ENCOUNTER — Ambulatory Visit (INDEPENDENT_AMBULATORY_CARE_PROVIDER_SITE_OTHER): Payer: Medicare Other | Admitting: Family Medicine

## 2016-01-02 ENCOUNTER — Ambulatory Visit
Admission: RE | Admit: 2016-01-02 | Discharge: 2016-01-02 | Disposition: A | Discharge status: Home / Self Care | Payer: Medicare Other | Source: Ambulatory Visit | Attending: Family Medicine | Admitting: Family Medicine

## 2016-01-02 VITALS — BP 102/54 | HR 82 | Temp 98.2°F | Resp 14 | Wt 119.0 lb

## 2016-01-02 DIAGNOSIS — F32A Depression, unspecified: Secondary | ICD-10-CM

## 2016-01-02 DIAGNOSIS — M545 Low back pain: Secondary | ICD-10-CM | POA: Insufficient documentation

## 2016-01-02 DIAGNOSIS — R110 Nausea: Secondary | ICD-10-CM

## 2016-01-02 DIAGNOSIS — F418 Other specified anxiety disorders: Secondary | ICD-10-CM | POA: Diagnosis not present

## 2016-01-02 DIAGNOSIS — M5136 Other intervertebral disc degeneration, lumbar region: Secondary | ICD-10-CM | POA: Diagnosis not present

## 2016-01-02 DIAGNOSIS — R5383 Other fatigue: Secondary | ICD-10-CM

## 2016-01-02 DIAGNOSIS — F329 Major depressive disorder, single episode, unspecified: Secondary | ICD-10-CM

## 2016-01-02 DIAGNOSIS — F419 Anxiety disorder, unspecified: Secondary | ICD-10-CM

## 2016-01-02 LAB — POCT URINALYSIS DIPSTICK
BILIRUBIN UA: NEGATIVE
Glucose, UA: NEGATIVE
KETONES UA: 80
LEUKOCYTES UA: NEGATIVE
NITRITE UA: NEGATIVE
PH UA: 6
PROTEIN UA: NEGATIVE
RBC UA: NEGATIVE
Spec Grav, UA: >=1.03
Urobilinogen, UA: 0.2

## 2016-01-02 NOTE — Progress Notes (Signed)
Patient: Ann Young Female    DOB: Aug 20, 1940   75 y.o.   MRN: [id number] Visit Date: 01/02/2016  Today's Provider: Vernie Murders, PA   Chief Complaint  Patient presents with  . Hyperlipidemia  . Gastroesophageal Reflux   Subjective:    HPI  Patient is here for follow up. Her last visit here was in March 2017. Labs were done at that time including CBC, metC, Lipid and TSH. She did get referred to psychiatrist and medication changes were done which she is addressing with the specialist.  Hypothyroidism:. Lab Results  Component Value Date   TSH 2.640 08/18/2015   She has noticed that her b/p has been running lower, even her psychiatrist mentioned it. She has been having symptoms of excessive fatigue and nausea at times. Not eating well.   No past medical history on file. Patient Active Problem List   Diagnosis Date Noted  . History of allergy 08/18/2015  . Osteoporosis 05/07/2015  . Constipation, chronic 05/07/2015  . History of colorectal cancer 05/07/2015  . Depression with anxiety 05/07/2015  . Arthritis 05/07/2015  . Mixed hyperlipidemia 05/07/2015  . History of colon polyps 05/07/2015  . GERD with stricture 05/07/2015  . Low back pain 02/10/2015  . Problems influencing health status 10/28/2014  . Arthritis 10/28/2014  . Malignant neoplasm ([place]) 10/28/2014  . History of colonic polyps 10/28/2014  . Chronic constipation 10/28/2014  . Anxiety and depression 10/28/2014  . Acid reflux 10/28/2014  . History of malignant neoplasm of large intestine 10/28/2014  . 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 OP (osteoporosis) 10/28/2014  . Gastric ulcer 10/28/2014  . 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 Awareness of heartbeats 03/25/2014   Past Surgical History  Procedure Laterality Date  . Resection of colorectal cancer without sequela  1999  . Cataract extraction, bilateral Bilateral 2013    Dr.  Willy Eddy  . Tubal ligation  1976  . Breast biopsy Left 2000    benign  . Cardiac catheterization  2015    WNL  . Cataract extraction Bilateral 2013  . Resection of colorectal cancer without sequela     Family History  Problem Relation Age of Onset  . Cirrhosis Sister   . Colon polyps Sister   . Leukemia Mother   . Depression Mother   . Heart disease Father   . Depression Father   . Bipolar disorder Daughter    Allergies  Allergen Reactions  . Clarithromycin Other (See Comments)    Questionable Biaxin vs Flagyl with chest discomfort & irregular heartbeat (most likely thought to be related to the Biaxin)  . Ibuprofen Other (See Comments)    To prevent swelling,increase hr/bp  . Lipitor  [Atorvastatin] Other (See Comments)    Mayalgia  . Metronidazole Other (See Comments)    Questionable Biaxin vs Flagyl with chest discomfort & irregular heartbeat (most likely thought to be related to the Biaxin)  . Other Other (See Comments)    decongestants  . Penicillins   . Simvastatin Other (See Comments)    Hari Loss  . Sulfa Antibiotics   . Toviaz  [Fesoterodine Fumarate Er] Other (See Comments)    weakness   Current Meds  Medication Sig  . alendronate (FOSAMAX) 70 MG tablet Take 70 mg by mouth once a week. Reported on 08/18/2015  . aspirin 81 MG tablet Take 81 mg by mouth daily.  . Calcium Carb-Cholecalciferol 600-200 MG-UNIT TABS Take 1 tablet by  mouth 2 (two) times daily.  . clonazePAM (KLONOPIN) 1 MG tablet Take 1 tablet (1 mg total) by mouth at bedtime.  Marland Kitchen escitalopram (LEXAPRO) 5 MG tablet   . lovastatin (MEVACOR) 40 MG tablet Take 1 tablet (40 mg total) by mouth at bedtime.  . Multiple Vitamins-Minerals (MULTIVITAMIN PO) Take by mouth.  . Omega-3 Fatty Acids (FISH OIL PO) Take by mouth.  Marland Kitchen omeprazole (PRILOSEC) 20 MG capsule Take 1 capsule (20 mg total) by mouth 2 (two) times daily.  . ranitidine (ZANTAC) 150 MG tablet Take 150 mg by mouth daily.  . traZODone (DESYREL) 50 MG  tablet   . Vitamin D, Cholecalciferol, 1000 UNITS CAPS Take 1 capsule by mouth daily.    Review of Systems  Constitutional: Positive for fatigue.  Respiratory: Negative.   Cardiovascular: Negative.   Gastrointestinal: Positive for nausea.  Musculoskeletal: Negative.   Neurological: Negative for dizziness.    Social History  Substance Use Topics  . Smoking status: Never Smoker   . Smokeless tobacco: Never Used  . Alcohol Use: No   Objective:   BP 102/54 mmHg  Pulse 82  Temp(Src) 98.2 F (36.8 C)  Resp 14  Wt 119 lb (53.978 kg) BP Readings from Last 3 Encounters:  01/02/16 102/54  08/18/15 104/60  05/12/15 118/64   Wt Readings from Last 3 Encounters:  01/02/16 119 lb (53.978 kg)  08/18/15 121 lb 3.2 oz (54.976 kg)  05/12/15 121 lb (54.885 kg)      Physical Exam  Constitutional: She is oriented to person, place, and time. She appears well-developed and well-nourished.  HENT:  Head: Normocephalic.  Right Ear: External ear normal.  Left Ear: External ear normal.  Nose: Nose normal.  Mouth/Throat: Oropharynx is clear and moist.  Eyes: Conjunctivae and EOM are normal.  Neck: Normal range of motion. Neck supple.  Cardiovascular: Normal rate and regular rhythm.   Pulmonary/Chest: Effort normal and breath sounds normal.  Abdominal: Soft. Bowel sounds are normal. There is tenderness.  Slight soreness in left groin.  Musculoskeletal: Normal range of motion.  Feels a soreness in the left upper lumbar paravertebral region. No CVA tenderness to percussion posteriorly. No rashes. Fair ROM.  Neurological: She is alert and oriented to person, place, and time. She has normal reflexes.  Skin: No rash noted.  Psychiatric: Her speech is normal and behavior is normal. Judgment normal. Her mood appears anxious. Cognition and memory are normal. She exhibits a depressed mood. She expresses no suicidal ideation. She expresses no suicidal plans.      Assessment & Plan:     1.  Nausea Onset over the past week with change in antidepressant/anxiety medications by psychiatrist. Some increase in reflux disease. Still taking the Omeprazole 20 mg BID with Zantac 150 mg hs. Will check routine labs and urinalysis. Recheck prn. Has scheduled GI appointment with Dr. Vira Agar to get upper and lower endoscopy. - CBC with Differential/Platelet - Comprehensive metabolic panel - POCT urinalysis dipstick  2. Other fatigue Worse with recent change in medications. Worried about possible anemia or sugar problems. Only eating 2 small meals a day. Weight down 3 lbs since May 2017. Feels she is not hungry since husband died in December 12, 2015. Will check routine labs for metabolic imbalances or dehydration. - CBC with Differential/Platelet - Comprehensive metabolic panel - TSH - POCT urinalysis dipstick  3. Anxiety and depression Followed by psychiatrist. [place] Scrape some of these symptoms may be side effects from Trazodone, Lexapro and coming off the Klonopin (tapering  down now). Will check labs and she has a call in place to discuss this with her psychiatrist today. - TSH  4. Low back pain without sciatica, unspecified back pain laterality Intermittent problems with back discomfort for a "couple years". Known history of osteoporosis with T5 mild compression on MRI in 2014. Seems this time discomfort may be associated with left groin discomfort. Will check urinalysis for hematuria or infection. Get x-ray evaluation of LS spine. - POCT urinalysis dipstick - DG Lumbar Spine Complete       Vernie Murders, PA  [facility]

## 2016-01-03 LAB — CBC WITH DIFFERENTIAL/PLATELET
BASOS ABS: 0 10*3/uL (ref 0.0–0.2)
Basos: 0 %
EOS (ABSOLUTE): 0 10*3/uL (ref 0.0–0.4)
EOS: 0 %
HEMATOCRIT: 38.3 % (ref 34.0–46.6)
HEMOGLOBIN: 13.2 g/dL (ref 11.1–15.9)
IMMATURE GRANULOCYTES: 0 %
Immature Grans (Abs): 0 10*3/uL (ref 0.0–0.1)
LYMPHS ABS: 1.4 10*3/uL (ref 0.7–3.1)
Lymphs: 24 %
MCH: 32 pg (ref 26.6–33.0)
MCHC: 34.5 g/dL (ref 31.5–35.7)
MCV: 93 fL (ref 79–97)
MONOCYTES: 5 %
Monocytes Absolute: 0.3 10*3/uL (ref 0.1–0.9)
NEUTROS PCT: 71 %
Neutrophils Absolute: 4.1 10*3/uL (ref 1.4–7.0)
Platelets: 174 10*3/uL (ref 150–379)
RBC: 4.12 x10E6/uL (ref 3.77–5.28)
RDW: 13 % (ref 12.3–15.4)
WBC: 5.8 10*3/uL (ref 3.4–10.8)

## 2016-01-03 LAB — COMPREHENSIVE METABOLIC PANEL
ALBUMIN: 4.4 g/dL (ref 3.5–4.8)
ALK PHOS: 38 IU/L — AB (ref 39–117)
ALT: 16 IU/L (ref 0–32)
AST: 17 IU/L (ref 0–40)
Albumin/Globulin Ratio: 1.8 (ref 1.2–2.2)
BUN/Creatinine Ratio: 24 (ref 12–28)
BUN: 16 mg/dL (ref 8–27)
Bilirubin Total: 0.3 mg/dL (ref 0.0–1.2)
CALCIUM: 9.3 mg/dL (ref 8.7–10.3)
CHLORIDE: 101 mmol/L (ref 96–106)
CO2: 24 mmol/L (ref 18–29)
CREATININE: 0.68 mg/dL (ref 0.57–1.00)
GFR calc Af Amer: 100 mL/min/{1.73_m2} (ref >59)
GFR, EST NON AFRICAN AMERICAN: 87 mL/min/{1.73_m2} (ref >59)
GLOBULIN, TOTAL: 2.4 g/dL (ref 1.5–4.5)
GLUCOSE: 105 mg/dL — AB (ref 65–99)
Potassium: 4 mmol/L (ref 3.5–5.2)
Sodium: 144 mmol/L (ref 134–144)
Total Protein: 6.8 g/dL (ref 6.0–8.5)

## 2016-01-03 LAB — TSH: TSH: 1.62 u[IU]/mL (ref 0.450–4.500)

## 2016-01-07 ENCOUNTER — Other Ambulatory Visit: Payer: Self-pay | Admitting: Family Medicine

## 2016-01-14 ENCOUNTER — Telehealth: Payer: Self-pay | Admitting: Family Medicine

## 2016-01-14 NOTE — Telephone Encounter (Signed)
Pt wants to know if she can get a referral

## 2016-01-20 ENCOUNTER — Ambulatory Visit
Admission: RE | Admit: 2016-01-20 | Discharge: 2016-01-20 | Disposition: A | Discharge status: Home / Self Care | Payer: Medicare Other | Source: Ambulatory Visit | Attending: Family Medicine | Admitting: Family Medicine

## 2016-01-20 ENCOUNTER — Encounter: Payer: Self-pay | Admitting: Family Medicine

## 2016-01-20 ENCOUNTER — Ambulatory Visit (INDEPENDENT_AMBULATORY_CARE_PROVIDER_SITE_OTHER): Payer: Medicare Other | Admitting: Family Medicine

## 2016-01-20 VITALS — BP 104/64 | HR 76 | Temp 98.3°F | Resp 16 | Ht 61.0 in | Wt 115.0 lb

## 2016-01-20 DIAGNOSIS — R062 Wheezing: Secondary | ICD-10-CM | POA: Diagnosis not present

## 2016-01-20 DIAGNOSIS — R05 Cough: Secondary | ICD-10-CM

## 2016-01-20 DIAGNOSIS — F329 Major depressive disorder, single episode, unspecified: Secondary | ICD-10-CM

## 2016-01-20 DIAGNOSIS — F418 Other specified anxiety disorders: Secondary | ICD-10-CM | POA: Diagnosis not present

## 2016-01-20 DIAGNOSIS — F419 Anxiety disorder, unspecified: Secondary | ICD-10-CM

## 2016-01-20 DIAGNOSIS — R059 Cough, unspecified: Secondary | ICD-10-CM

## 2016-01-20 NOTE — Progress Notes (Signed)
Patient: Ann Young Female    DOB: June 27, 1940   75 y.o.   MRN: [id number] Visit Date: 01/20/2016  Today's Provider: Vernie Murders, PA   Chief Complaint  Patient presents with  . Referral    patient requesting referral to pulmonary   Subjective:    HPI Cough: Patient complains of productive cough with sputum described as yellow.  Symptoms began 1 year ago on and off.  The cough is productive of green/yellow sputum, with wheezing, with shortness of breath and is aggravated by dust and stress Associated symptoms include:shortness of breath, sputum production and wheezing. Patient does not have new pets. Patient does have a history of asthma. Patient does have a history of environmental allergens. Patient does not have recent travel. Patient does not have a history of smoking. Patient  does not have previous Chest X-ray. Patient has not had a PPD done.    Patient Active Problem List   Diagnosis Date Noted  . History of allergy 08/18/2015  . Osteoporosis 05/07/2015  . Constipation, chronic 05/07/2015  . History of colorectal cancer 05/07/2015  . Depression with anxiety 05/07/2015  . Arthritis 05/07/2015  . Mixed hyperlipidemia 05/07/2015  . History of colon polyps 05/07/2015  . GERD with stricture 05/07/2015  . Low back pain 02/10/2015  . Problems influencing health status 10/28/2014  . Arthritis 10/28/2014  . Malignant neoplasm ([place]) 10/28/2014  . History of colonic polyps 10/28/2014  . Chronic constipation 10/28/2014  . Anxiety and depression 10/28/2014  . Acid reflux 10/28/2014  . History of malignant neoplasm of large intestine 10/28/2014  . 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 OP (osteoporosis) 10/28/2014  . Gastric ulcer 10/28/2014  . 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 Awareness of heartbeats 03/25/2014   Past Surgical History:  Procedure Laterality Date  . BREAST BIOPSY Left 2000   benign  .  CARDIAC CATHETERIZATION  2015   WNL  . CATARACT EXTRACTION Bilateral 2013  . CATARACT EXTRACTION, BILATERAL Bilateral 2013   Dr. Willy Eddy  . resection of colorectal cancer without sequela  1999  . resection of colorectal cancer without sequela    . TUBAL LIGATION  1976   Family History  Problem Relation Age of Onset  . Cirrhosis Sister   . Colon polyps Sister   . Leukemia Mother   . Depression Mother   . Heart disease Father   . Depression Father   . Bipolar disorder Daughter    Allergies  Allergen Reactions  . Clarithromycin Other (See Comments)    Questionable Biaxin vs Flagyl with chest discomfort & irregular heartbeat (most likely thought to be related to the Biaxin)  . Ibuprofen Other (See Comments)    To prevent swelling,increase hr/bp  . Lipitor  [Atorvastatin] Other (See Comments)    Mayalgia  . Metronidazole Other (See Comments)    Questionable Biaxin vs Flagyl with chest discomfort & irregular heartbeat (most likely thought to be related to the Biaxin)  . Other Other (See Comments)    decongestants  . Penicillins   . Simvastatin Other (See Comments)    Hari Loss  . Sulfa Antibiotics   . Toviaz  [Fesoterodine Fumarate Er] Other (See Comments)    weakness   Current Outpatient Prescriptions on File Prior to Visit  Medication Sig Dispense Refill  . alendronate (FOSAMAX) 70 MG tablet Take 70 mg by mouth once a week. Reported on 08/18/2015    . aspirin 81 MG tablet Take  81 mg by mouth daily.    . Calcium Carb-Cholecalciferol 600-200 MG-UNIT TABS Take 1 tablet by mouth 2 (two) times daily.    . cetirizine (ZYRTEC) 10 MG tablet TAKE ONE TABLET BY MOUTH ONCE DAILY FOR ALLERGIES 90 tablet 0  . clonazePAM (KLONOPIN) 1 MG tablet Take 1 tablet (1 mg total) by mouth at bedtime. 90 tablet 0  . lovastatin (MEVACOR) 40 MG tablet Take 1 tablet (40 mg total) by mouth at bedtime. 90 tablet 3  . Multiple Vitamins-Minerals (MULTIVITAMIN PO) Take by mouth.    . Omega-3 Fatty Acids  (FISH OIL PO) Take by mouth.    Marland Kitchen omeprazole (PRILOSEC) 20 MG capsule Take 1 capsule (20 mg total) by mouth 2 (two) times daily. 180 capsule 3  . ranitidine (ZANTAC) 150 MG tablet Take 150 mg by mouth daily.    . Vitamin D, Cholecalciferol, 1000 UNITS CAPS DULoxetine (CYMBALTA) 20 MG capsule 1 capsule, Daily   Take 1 capsule by mouth daily.     No current facility-administered medications on file prior to visit.      Review of Systems  Constitutional: Negative.   Respiratory: Positive for cough, shortness of breath and wheezing.   Cardiovascular: Negative.     Social History  Substance Use Topics  . Smoking status: Never Smoker  . Smokeless tobacco: Never Used  . Alcohol use No   Objective:   BP 104/64 (BP Location: Left Arm, Patient Position: Sitting, Cuff Size: Normal)   Pulse 76   Temp 98.3 F (36.8 C) (Oral)   Resp 16   Ht 5\' 1"  (1.549 m)   Wt 115 lb (52.2 kg)   SpO2 96%   BMI 21.73 kg/m   Physical Exam  Constitutional: She is oriented to person, place, and time. She appears well-developed and well-nourished. No distress.  HENT:  Head: Normocephalic and atraumatic.  Right Ear: Hearing and external ear normal.  Left Ear: Hearing and external ear normal.  Nose: Nose normal.  Mouth/Throat: Oropharynx is clear and moist.  Eyes: Conjunctivae, EOM and lids are normal. Right eye exhibits no discharge. Left eye exhibits no discharge. No scleral icterus.  Pale membranes.  Neck: Neck supple.  Cardiovascular: Normal rate, regular rhythm and normal heart sounds.   Pulmonary/Chest: Effort normal and breath sounds normal. No respiratory distress.  Abdominal: Soft. Bowel sounds are normal.  Musculoskeletal: Normal range of motion.  Neurological: She is alert and oriented to person, place, and time.  Skin: Skin is intact. No lesion and no rash noted.  Psychiatric: She has a normal mood and affect. Her speech is normal and behavior is normal. Thought content normal.     Assessment & Plan:     1. Cough Intermittent cough early morning and late evening over the past year. More concerned about it now with increase in anxiety and depression symptoms. Spirometry normal and good pulse oximetry. Will check chest x-ray and may use Mucinex BID prn. - Spirometry with Graph - DG Chest 2 View  2. Wheeze No wheezes noted this morning. No significant sputum production or fever. Suspect post nasal drip and upper airway noises. No wheeze on ascultation today. Follow up pending x-ray report. - Spirometry with Graph - DG Chest 2 View  3. Anxiety and depression Feels anxiety and depression worsening since Dr. Nicolasa Ducking (psychiatrist) has started the transition from Clonazepam to Cymbalta. Will be seeing her in 2 days for next dosage adjustment as she tapers off the Clonazepam.     Vernie Murders,  [facility]

## 2016-01-21 NOTE — Telephone Encounter (Signed)
Patient advised as below.  

## 2016-01-21 NOTE — Telephone Encounter (Signed)
-----   Message from Margo Common, [place] sent at 01/20/2016  5:30 PM EDT ----- Normal chest x-ray. No sign of active cardiopulmonary disease. Proceed with plan to see Dr. Nicolasa Ducking in a couple days to adjust Cymbalta and taper off Clonazepam. Recheck here as needed.

## 2016-01-22 DIAGNOSIS — F411 Generalized anxiety disorder: Secondary | ICD-10-CM | POA: Diagnosis not present

## 2016-01-22 DIAGNOSIS — F330 Major depressive disorder, recurrent, mild: Secondary | ICD-10-CM | POA: Diagnosis not present

## 2016-01-26 DIAGNOSIS — D229 Melanocytic nevi, unspecified: Secondary | ICD-10-CM | POA: Diagnosis not present

## 2016-01-26 DIAGNOSIS — L820 Inflamed seborrheic keratosis: Secondary | ICD-10-CM | POA: Diagnosis not present

## 2016-01-26 DIAGNOSIS — L821 Other seborrheic keratosis: Secondary | ICD-10-CM | POA: Diagnosis not present

## 2016-01-30 DIAGNOSIS — F331 Major depressive disorder, recurrent, moderate: Secondary | ICD-10-CM | POA: Diagnosis not present

## 2016-01-30 DIAGNOSIS — Z634 Disappearance and death of family member: Secondary | ICD-10-CM | POA: Diagnosis not present

## 2016-01-30 DIAGNOSIS — F411 Generalized anxiety disorder: Secondary | ICD-10-CM | POA: Diagnosis not present

## 2016-02-03 ENCOUNTER — Encounter: Payer: Self-pay | Admitting: *Deleted

## 2016-02-04 ENCOUNTER — Ambulatory Visit
Admission: RE | Admit: 2016-02-04 | Payer: Medicare Other | Source: Ambulatory Visit | Admitting: Unknown Physician Specialty

## 2016-02-04 ENCOUNTER — Ambulatory Visit
Admission: EM | Admit: 2016-02-04 | Discharge: 2016-02-04 | Disposition: A | Discharge status: Home / Self Care | Payer: Medicare Other | Attending: Emergency Medicine | Admitting: Emergency Medicine

## 2016-02-04 ENCOUNTER — Encounter: Payer: Self-pay | Admitting: Emergency Medicine

## 2016-02-04 ENCOUNTER — Encounter
Admission: EM | Disposition: A | Discharge status: Home / Self Care | Payer: Self-pay | Source: Home / Self Care | Attending: Emergency Medicine

## 2016-02-04 ENCOUNTER — Ambulatory Visit: Payer: Medicare Other | Admitting: Anesthesiology

## 2016-02-04 DIAGNOSIS — E876 Hypokalemia: Secondary | ICD-10-CM | POA: Diagnosis not present

## 2016-02-04 DIAGNOSIS — K209 Esophagitis, unspecified: Secondary | ICD-10-CM | POA: Diagnosis not present

## 2016-02-04 DIAGNOSIS — E7800 Pure hypercholesterolemia, unspecified: Secondary | ICD-10-CM | POA: Insufficient documentation

## 2016-02-04 DIAGNOSIS — Z7982 Long term (current) use of aspirin: Secondary | ICD-10-CM | POA: Diagnosis not present

## 2016-02-04 DIAGNOSIS — F419 Anxiety disorder, unspecified: Secondary | ICD-10-CM | POA: Diagnosis not present

## 2016-02-04 DIAGNOSIS — K317 Polyp of stomach and duodenum: Secondary | ICD-10-CM | POA: Diagnosis not present

## 2016-02-04 DIAGNOSIS — M1990 Unspecified osteoarthritis, unspecified site: Secondary | ICD-10-CM | POA: Diagnosis not present

## 2016-02-04 DIAGNOSIS — K648 Other hemorrhoids: Secondary | ICD-10-CM | POA: Diagnosis not present

## 2016-02-04 DIAGNOSIS — K2960 Other gastritis without bleeding: Secondary | ICD-10-CM | POA: Diagnosis not present

## 2016-02-04 DIAGNOSIS — Z882 Allergy status to sulfonamides status: Secondary | ICD-10-CM | POA: Insufficient documentation

## 2016-02-04 DIAGNOSIS — Z1211 Encounter for screening for malignant neoplasm of colon: Secondary | ICD-10-CM | POA: Diagnosis not present

## 2016-02-04 DIAGNOSIS — K2970 Gastritis, unspecified, without bleeding: Secondary | ICD-10-CM | POA: Diagnosis not present

## 2016-02-04 DIAGNOSIS — K210 Gastro-esophageal reflux disease with esophagitis: Secondary | ICD-10-CM | POA: Insufficient documentation

## 2016-02-04 DIAGNOSIS — F418 Other specified anxiety disorders: Secondary | ICD-10-CM | POA: Insufficient documentation

## 2016-02-04 DIAGNOSIS — D125 Benign neoplasm of sigmoid colon: Secondary | ICD-10-CM | POA: Insufficient documentation

## 2016-02-04 DIAGNOSIS — Z8711 Personal history of peptic ulcer disease: Secondary | ICD-10-CM | POA: Diagnosis not present

## 2016-02-04 DIAGNOSIS — Z880 Allergy status to penicillin: Secondary | ICD-10-CM | POA: Insufficient documentation

## 2016-02-04 DIAGNOSIS — J45909 Unspecified asthma, uncomplicated: Secondary | ICD-10-CM | POA: Insufficient documentation

## 2016-02-04 DIAGNOSIS — K635 Polyp of colon: Secondary | ICD-10-CM | POA: Diagnosis not present

## 2016-02-04 DIAGNOSIS — K259 Gastric ulcer, unspecified as acute or chronic, without hemorrhage or perforation: Secondary | ICD-10-CM | POA: Diagnosis not present

## 2016-02-04 DIAGNOSIS — E782 Mixed hyperlipidemia: Secondary | ICD-10-CM | POA: Diagnosis not present

## 2016-02-04 DIAGNOSIS — Z79899 Other long term (current) drug therapy: Secondary | ICD-10-CM | POA: Insufficient documentation

## 2016-02-04 DIAGNOSIS — K449 Diaphragmatic hernia without obstruction or gangrene: Secondary | ICD-10-CM | POA: Diagnosis not present

## 2016-02-04 DIAGNOSIS — K5909 Other constipation: Secondary | ICD-10-CM | POA: Insufficient documentation

## 2016-02-04 DIAGNOSIS — Z85038 Personal history of other malignant neoplasm of large intestine: Secondary | ICD-10-CM | POA: Diagnosis not present

## 2016-02-04 DIAGNOSIS — K640 First degree hemorrhoids: Secondary | ICD-10-CM | POA: Diagnosis not present

## 2016-02-04 DIAGNOSIS — R112 Nausea with vomiting, unspecified: Secondary | ICD-10-CM | POA: Diagnosis not present

## 2016-02-04 DIAGNOSIS — K621 Rectal polyp: Secondary | ICD-10-CM | POA: Insufficient documentation

## 2016-02-04 DIAGNOSIS — D120 Benign neoplasm of cecum: Secondary | ICD-10-CM | POA: Diagnosis not present

## 2016-02-04 DIAGNOSIS — K2950 Unspecified chronic gastritis without bleeding: Secondary | ICD-10-CM | POA: Diagnosis not present

## 2016-02-04 DIAGNOSIS — Z86010 Personal history of colonic polyps: Secondary | ICD-10-CM | POA: Diagnosis not present

## 2016-02-04 DIAGNOSIS — Z800 Family history of malignant neoplasm of digestive organs: Secondary | ICD-10-CM | POA: Diagnosis not present

## 2016-02-04 DIAGNOSIS — K5140 Inflammatory polyps of colon without complications: Secondary | ICD-10-CM | POA: Diagnosis not present

## 2016-02-04 DIAGNOSIS — Z888 Allergy status to other drugs, medicaments and biological substances status: Secondary | ICD-10-CM | POA: Insufficient documentation

## 2016-02-04 HISTORY — DX: Epigastric pain: R10.13

## 2016-02-04 HISTORY — DX: Unspecified asthma, uncomplicated: J45.909

## 2016-02-04 HISTORY — DX: Pure hypercholesterolemia, unspecified: E78.00

## 2016-02-04 HISTORY — DX: Malignant (primary) neoplasm, unspecified: C80.1

## 2016-02-04 HISTORY — DX: Major depressive disorder, single episode, unspecified: F32.9

## 2016-02-04 HISTORY — PX: ESOPHAGOGASTRODUODENOSCOPY (EGD) WITH PROPOFOL: SHX5813

## 2016-02-04 HISTORY — DX: Personal history of other diseases of the digestive system: Z87.19

## 2016-02-04 HISTORY — PX: COLONOSCOPY WITH PROPOFOL: SHX5780

## 2016-02-04 HISTORY — DX: Peptic ulcer, site unspecified, unspecified as acute or chronic, without hemorrhage or perforation: K27.9

## 2016-02-04 HISTORY — DX: Anxiety disorder, unspecified: F41.9

## 2016-02-04 HISTORY — DX: Unspecified osteoarthritis, unspecified site: M19.90

## 2016-02-04 HISTORY — DX: Gastro-esophageal reflux disease without esophagitis: K21.9

## 2016-02-04 HISTORY — DX: Depression, unspecified: F32.A

## 2016-02-04 LAB — CBC
HCT: 39.6 % (ref 35.0–47.0)
Hemoglobin: 14.1 g/dL (ref 12.0–16.0)
MCH: 32.8 pg (ref 26.0–34.0)
MCHC: 35.6 g/dL (ref 32.0–36.0)
MCV: 92.1 fL (ref 80.0–100.0)
PLATELETS: 175 10*3/uL (ref 150–440)
RBC: 4.3 MIL/uL (ref 3.80–5.20)
RDW: 12.7 % (ref 11.5–14.5)
WBC: 8.4 10*3/uL (ref 3.6–11.0)

## 2016-02-04 LAB — COMPREHENSIVE METABOLIC PANEL
ALT: 17 U/L (ref 14–54)
AST: 22 U/L (ref 15–41)
Albumin: 4.8 g/dL (ref 3.5–5.0)
Alkaline Phosphatase: 32 U/L — ABNORMAL LOW (ref 38–126)
Anion gap: 9 (ref 5–15)
BUN: 9 mg/dL (ref 6–20)
CHLORIDE: 97 mmol/L — AB (ref 101–111)
CO2: 27 mmol/L (ref 22–32)
Calcium: 9.3 mg/dL (ref 8.9–10.3)
Creatinine, Ser: 0.59 mg/dL (ref 0.44–1.00)
GFR calc Af Amer: >60 mL/min (ref >60)
GFR calc non Af Amer: >60 mL/min (ref >60)
Glucose, Bld: 134 mg/dL — ABNORMAL HIGH (ref 65–99)
Potassium: 2.7 mmol/L — CL (ref 3.5–5.1)
SODIUM: 133 mmol/L — AB (ref 135–145)
TOTAL PROTEIN: 7.5 g/dL (ref 6.5–8.1)
Total Bilirubin: 0.7 mg/dL (ref 0.3–1.2)

## 2016-02-04 LAB — BASIC METABOLIC PANEL
ANION GAP: 6 (ref 5–15)
BUN: 7 mg/dL (ref 6–20)
CALCIUM: 8.2 mg/dL — AB (ref 8.9–10.3)
CO2: 24 mmol/L (ref 22–32)
CREATININE: 0.52 mg/dL (ref 0.44–1.00)
Chloride: 103 mmol/L (ref 101–111)
Glucose, Bld: 116 mg/dL — ABNORMAL HIGH (ref 65–99)
Potassium: 3.2 mmol/L — ABNORMAL LOW (ref 3.5–5.1)
Sodium: 133 mmol/L — ABNORMAL LOW (ref 135–145)

## 2016-02-04 LAB — PROTIME-INR
INR: 0.92
PROTHROMBIN TIME: 12.4 s (ref 11.4–15.2)

## 2016-02-04 SURGERY — COLONOSCOPY WITH PROPOFOL
Anesthesia: General

## 2016-02-04 MED ORDER — SODIUM CHLORIDE 0.9 % IV SOLN: INTRAVENOUS | Status: DC

## 2016-02-04 MED ORDER — POTASSIUM CHLORIDE IN NACL 20-0.9 MEQ/L-% IV SOLN
Freq: Once | INTRAVENOUS | Status: AC
  Administered 2016-02-04: 1000 mL via INTRAVENOUS
  Filled 2016-02-04: qty 1000

## 2016-02-04 MED ORDER — ONDANSETRON HCL 4 MG/2ML IJ SOLN
4.0000 mg | Freq: Once | INTRAMUSCULAR | Status: AC
  Administered 2016-02-04: 4 mg via INTRAVENOUS
  Filled 2016-02-04: qty 2

## 2016-02-04 MED ORDER — MIDAZOLAM HCL 2 MG/2ML IJ SOLN
INTRAMUSCULAR | Status: DC | PRN
Start: 2016-02-04 — End: 2016-02-04
  Administered 2016-02-04: 2 mg via INTRAVENOUS

## 2016-02-04 MED ORDER — POTASSIUM CHLORIDE 10 MEQ/100ML PEDIATRIC IV SOLN: 10.0000 meq | INTRAVENOUS | Status: DC

## 2016-02-04 MED ORDER — SODIUM CHLORIDE 0.9 % IV BOLUS (SEPSIS)
1000.0000 mL | Freq: Once | INTRAVENOUS | Status: AC
  Administered 2016-02-04: 1000 mL via INTRAVENOUS

## 2016-02-04 MED ORDER — SUCCINYLCHOLINE CHLORIDE 20 MG/ML IJ SOLN
INTRAMUSCULAR | Status: DC | PRN
  Administered 2016-02-04: 100 mg via INTRAVENOUS

## 2016-02-04 MED ORDER — PHENYLEPHRINE HCL 10 MG/ML IJ SOLN
INTRAMUSCULAR | Status: DC | PRN
  Administered 2016-02-04 (×6): 50 ug via INTRAVENOUS

## 2016-02-04 MED ORDER — LACTATED RINGERS IV SOLN
INTRAVENOUS | Status: DC
  Administered 2016-02-04: 11:00:00 via INTRAVENOUS

## 2016-02-04 MED ORDER — GLYCOPYRROLATE 0.2 MG/ML IJ SOLN
INTRAMUSCULAR | Status: DC | PRN
  Administered 2016-02-04: 0.2 mg via INTRAVENOUS

## 2016-02-04 MED ORDER — ONDANSETRON HCL 4 MG/2ML IJ SOLN
INTRAMUSCULAR | Status: DC | PRN
  Administered 2016-02-04: 4 mg via INTRAVENOUS

## 2016-02-04 MED ORDER — LIDOCAINE HCL (CARDIAC) 20 MG/ML IV SOLN
INTRAVENOUS | Status: DC | PRN
  Administered 2016-02-04: 30 mg via INTRAVENOUS

## 2016-02-04 MED ORDER — PROPOFOL 10 MG/ML IV BOLUS
INTRAVENOUS | Status: DC | PRN
  Administered 2016-02-04: 30 mg via INTRAVENOUS
  Administered 2016-02-04: 100 mg via INTRAVENOUS
  Administered 2016-02-04: 30 mg via INTRAVENOUS

## 2016-02-04 MED ORDER — ONDANSETRON 4 MG PO TBDP: 4.0000 mg | ORAL_TABLET | Freq: Three times a day (TID) | ORAL | 0 refills | Status: DC | PRN

## 2016-02-04 MED ORDER — PROMETHAZINE HCL 25 MG/ML IJ SOLN
6.2500 mg | INTRAMUSCULAR | Status: DC | PRN
Start: 2016-02-04 — End: 2016-02-04
  Filled 2016-02-04: qty 1

## 2016-02-04 MED ORDER — POTASSIUM CHLORIDE CRYS ER 20 MEQ PO TBCR
40.0000 meq | EXTENDED_RELEASE_TABLET | Freq: Once | ORAL | Status: AC
  Administered 2016-02-04: 40 meq via ORAL
  Filled 2016-02-04: qty 2

## 2016-02-04 NOTE — ED Provider Notes (Signed)
Patient's potassium has been repleted here, repeat potassium check with some remaining potassium infusion pending already shows improvement. She is stable to go to the endoscopy suite   Earleen Newport, MD 02/04/16 [phone]

## 2016-02-04 NOTE — ED Provider Notes (Signed)
[facility] ____   None    (approximate)  I have reviewed the triage vital signs and the nursing notes.   HISTORY  Chief Complaint Emesis    HPI Ann Young is a 75 y.o. female history colorectal cancer undergoing colon prep MiraLAX for a scheduled colonoscopy today presents with nausea and multiple episodes vomiting. Patient denies fever.   Past Medical History:  Diagnosis Date  . Anxiety   . Arthritis   . Asthma   . Cancer ([place])   . Depression   . Dyspepsia   . Elevated cholesterol   . GERD (gastroesophageal reflux disease)   . History of hiatal hernia   . Peptic ulcer disease     Patient Active Problem List   Diagnosis Date Noted  . History of allergy 08/18/2015  . Osteoporosis 05/07/2015  . Constipation, chronic 05/07/2015  . History of colorectal cancer 05/07/2015  . Depression with anxiety 05/07/2015  . Arthritis 05/07/2015  . Mixed hyperlipidemia 05/07/2015  . History of colon polyps 05/07/2015  . GERD with stricture 05/07/2015  . Low back pain 02/10/2015  . Problems influencing health status 10/28/2014  . Arthritis 10/28/2014  . Malignant neoplasm ([place]) 10/28/2014  . History of colonic polyps 10/28/2014  . Chronic constipation 10/28/2014  . Anxiety and depression 10/28/2014  . Acid reflux 10/28/2014  . History of malignant neoplasm of large intestine 10/28/2014  . 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 OP (osteoporosis) 10/28/2014  . Gastric ulcer 10/28/2014  . 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 Awareness of heartbeats 03/25/2014    Past Surgical History:  Procedure Laterality Date  . BREAST BIOPSY Left 2000   benign  . CARDIAC CATHETERIZATION  2015   WNL  . CATARACT EXTRACTION Bilateral 2013  . CATARACT EXTRACTION, BILATERAL Bilateral 2013   Dr. Willy Eddy  . resection of colorectal cancer without sequela  1999  . resection of colorectal cancer without  sequela    . TUBAL LIGATION  1976    Prior to Admission medications   Medication Sig Start Date End Date Taking? Authorizing Provider  alendronate (FOSAMAX) 70 MG tablet Take 70 mg by mouth once a week. Reported on 08/18/2015    Historical Provider, MD  aspirin 81 MG tablet Take 81 mg by mouth daily.    Historical Provider, MD  Calcium Carb-Cholecalciferol 600-200 MG-UNIT TABS Take 1 tablet by mouth 2 (two) times daily.    Historical Provider, MD  cetirizine (ZYRTEC) 10 MG tablet TAKE ONE TABLET BY MOUTH ONCE DAILY FOR ALLERGIES 01/08/16   Vickki Muff Chrismon, PA  clonazePAM (KLONOPIN) 1 MG tablet Take 1 tablet (1 mg total) by mouth at bedtime. 08/18/15   Vickki Muff Chrismon, PA  DULoxetine (CYMBALTA) 20 MG capsule Take 1 capsule by mouth daily. 01/02/16   Historical Provider, MD  lovastatin (MEVACOR) 40 MG tablet Take 1 tablet (40 mg total) by mouth at bedtime. 08/18/15   Vickki Muff Chrismon, PA  Multiple Vitamins-Minerals (MULTIVITAMIN PO) Take by mouth.    Historical Provider, MD  Omega-3 Fatty Acids (FISH OIL PO) Take by mouth.    Historical Provider, MD  omeprazole (PRILOSEC) 20 MG capsule Take 1 capsule (20 mg total) by mouth 2 (two) times daily. 08/18/15   Vickki Muff Chrismon, PA  ranitidine (ZANTAC) 150 MG tablet Take 150 mg by mouth daily.    Historical Provider, MD  Vitamin D, Cholecalciferol, 1000 UNITS CAPS Take 1 capsule by mouth daily.  Historical Provider, MD    Allergies Clarithromycin; Ibuprofen; Lipitor  [atorvastatin]; Metronidazole; Other; Penicillins; Simvastatin; Sulfa antibiotics; and Toviaz  [fesoterodine fumarate er]  Family History  Problem Relation Age of Onset  . Cirrhosis Sister   . Colon polyps Sister   . Leukemia Mother   . Depression Mother   . Heart disease Father   . Depression Father   . Bipolar disorder Daughter     Social History Social History  Substance Use Topics  . Smoking status: Never Smoker  . Smokeless tobacco: Never Used  . Alcohol use No     Review of Systems Constitutional: No fever/chills Eyes: No visual changes. ENT: No sore throat. Cardiovascular: Denies chest pain. Respiratory: Denies shortness of breath. Gastrointestinal: Positive for epigastric pain and vomiting  Genitourinary: Negative for dysuria. Musculoskeletal: Negative for back pain. Skin: Negative for rash. Neurological: Negative for headaches, focal weakness or numbness.  10-point ROS otherwise negative.  ____________________________________________   PHYSICAL EXAM:  VITAL SIGNS: ED Triage Vitals  Enc Vitals Group     BP 02/04/16 0557 (!) 146/43     Pulse Rate 02/04/16 0557 75     Resp 02/04/16 0557 16     Temp 02/04/16 0557 97.8 F (36.6 C)     Temp Source 02/04/16 0557 Oral     SpO2 --      Weight 02/04/16 0553 114 lb (51.7 kg)     Height 02/04/16 0553 5\' 1"  (1.549 m)     Head Circumference --      Peak Flow --      Pain Score 02/04/16 0559 10     Pain Loc --      Pain Edu? --      Excl. in [place]? --     Constitutional: Alert and oriented. Well appearing and in no acute distress. Eyes: Conjunctivae are normal. PERRL. EOMI. Head: Atraumatic. Mouth/Throat: Mucous membranes are moist.  Oropharynx non-erythematous. Neck: No stridor.  No meningeal signs.  Cardiovascular: Normal rate, regular rhythm. Good peripheral circulation. Grossly normal heart sounds. Respiratory: Normal respiratory effort.  No retractions. Lungs CTAB. Gastrointestinal: Soft and nontender. No distention.  Musculoskeletal: No lower extremity tenderness nor edema. No gross deformities of extremities. Neurologic:  Normal speech and language. No gross focal neurologic deficits are appreciated.  Skin:  Skin is warm, dry and intact. No rash noted. Psychiatric: Mood and affect are normal. Speech and behavior are normal.  ____________________________________________   LABS (all labs ordered are listed, but only abnormal results are displayed)  Labs Reviewed  CBC   PROTIME-INR  COMPREHENSIVE METABOLIC PANEL   ____________________________________________  RADIOLOGY I, Sig[place], personally viewed and evaluated these images (plain radiographs) as part of my medical decision making, as well as reviewing the written report by the radiologist.    Procedures     INITIAL IMPRESSION / [place] / ED COURSE  Pertinent labs & imaging results that were available during my care of the patient were reviewed by me and considered in my medical decision making (see chart for details).  Patient discussed with Dr. Vira Agar gastroenterologist who agreed with plan for repletion of potassium with addition of magnesium. Dr. Vira Agar states that after repletion with recheck a potassium if appropriate endoscopy/colonoscopy would be performed today.   Clinical Course    ____________________________________________  FINAL CLINICAL IMPRESSION(S) / ED DIAGNOSES  Final diagnoses:  Hypokalemia  Non-intractable vomiting with nausea, vomiting of unspecified type     MEDICATIONS GIVEN DURING THIS VISIT:  Medications  ondansetron [facility]) injection 4 mg (4 mg Intravenous Given 02/04/16 0609)  sodium chloride 0.9 % bolus 1,000 mL (1,000 mLs Intravenous New Bag/Given 02/04/16 0609)  ondansetron (ZOFRAN) injection 4 mg (4 mg Intravenous Given 02/04/16 0639)     NEW OUTPATIENT MEDICATIONS STARTED DURING THIS VISIT:  New Prescriptions   No medications on file      Note:  This document was prepared using Dragon voice recognition software and may include unintentional dictation errors.    Gregor Hams, MD 02/05/16 [phone]

## 2016-02-04 NOTE — Anesthesia Preprocedure Evaluation (Signed)
Anesthesia Evaluation  Patient identified by MRN, date of birth, ID band Patient awake    Reviewed: Allergy & Precautions, NPO status , Patient's Chart, lab work & pertinent test results  History of Anesthesia Complications Negative for: history of anesthetic complications  Airway Mallampati: II  TM Distance: >3 FB Neck ROM: Full    Dental  (+) Poor Dentition   Pulmonary asthma , neg sleep apnea, neg COPD,    breath sounds clear to auscultation- rhonchi (-) decreased breath sounds(-) wheezing      Cardiovascular Exercise Tolerance: Good (-) hypertension(-) CAD and (-) Past MI  Rhythm:Regular Rate:Normal - Systolic murmurs and - Diastolic murmurs    Neuro/Psych Anxiety Depression negative neurological ROS     GI/Hepatic Neg liver ROS, hiatal hernia, PUD, GERD  Medicated,  Endo/Other  negative endocrine ROSneg diabetes  Renal/GU negative Renal ROS     Musculoskeletal  (+) Arthritis , Osteoarthritis,    Abdominal (+) - obese,   Peds  Hematology negative hematology ROS (+)   Anesthesia Other Findings Past Medical History: No date: Anxiety No date: Arthritis No date: Asthma No date: Cancer ([place]) No date: Depression No date: Dyspepsia No date: Elevated cholesterol No date: GERD (gastroesophageal reflux disease) No date: History of hiatal hernia No date: Peptic ulcer disease   Reproductive/Obstetrics                             Anesthesia Physical Anesthesia Plan  ASA: II  Anesthesia Plan: General   Post-op Pain Management:    Induction: Intravenous  Airway Management Planned: Oral ETT  Additional Equipment:   Intra-op Plan:   Post-operative Plan: Extubation in OR  Informed Consent: I have reviewed the patients History and Physical, chart, labs and discussed the procedure including the risks, benefits and alternatives for the proposed anesthesia with the patient or  authorized representative who has indicated his/her understanding and acceptance.   Dental advisory given  Plan Discussed with: CRNA and Anesthesiologist  Anesthesia Plan Comments: (Plan for intubation given recent vomiting)        Anesthesia Quick Evaluation

## 2016-02-04 NOTE — Op Note (Signed)
[facility]astroenter[facility] Patient Name: Ann Young Procedure Date: 02/04/2016 11:48 AM MRN: [id number] Account #: [id number] Date of Birth: [birth year] Admit Type: Inpatient Age: 75 Room: [facility] ENDO ROOM 4 Gender: Female Note Status: Finalized Procedure:            Upper GI endoscopy Providers:            Manya Silvas, MD Referring MD:         Kirstie Peri. Caryn Section, MD (Referring MD) Medicines:            General Anesthesia Complications:        No immediate complications. Procedure:            Pre-Anesthesia Assessment:                       - After reviewing the risks and benefits, the patient                        was deemed in satisfactory condition to undergo the                        procedure.                       After obtaining informed consent, the endoscope was                        passed under direct vision. Throughout the procedure,                        the patient's blood pressure, pulse, and oxygen                        saturations were monitored continuously. The Endoscope                        was introduced through the mouth, and advanced to the                        second part of duodenum. The upper GI endoscopy was                        accomplished without difficulty. The patient tolerated                        the procedure well. Findings:      LA Grade A (one or more mucosal breaks less than 5 mm, not extending       between tops of 2 mucosal folds) esophagitis with no bleeding was found       37 cm from the incisors. Biopsies were taken with a cold forceps for       histology.      Multiple small sessile polyps with no bleeding and no stigmata of recent       bleeding were found in the gastric body.      Patchy mildly erythematous mucosa without bleeding was found in the       gastric antrum. Biopsies were taken with a cold forceps for histology.       Biopsies were taken with a cold forceps for Helicobacter pylori  testing.      The examined duodenum was  normal. Impression:           - LA Grade A reflux esophagitis. Rule out Barrett's                        esophagus. Biopsied.                       - Multiple gastric polyps.                       - Erythematous mucosa in the antrum. Biopsied.                       - Normal examined duodenum. Recommendation:       - Await pathology results. Manya Silvas, MD 02/04/2016 12:10:51 PM This report has been signed electronically. Number of Addenda: 0 Note Initiated On: 02/04/2016 11:48 AM      [facility]

## 2016-02-04 NOTE — ED Triage Notes (Signed)
Pt presents to ED with frequent vomiting since 0130 after attempting to complete her colonoscopy prep. Pt reports her vomit appeared to have coffee grounds the first time she vomited this morning. Epigastric pain described as aching. Actively vomiting during triage.

## 2016-02-04 NOTE — Op Note (Signed)
[facility] Patient Name: Ann Young Procedure Date: 02/04/2016 11:44 AM MRN: [id number] Account #: [id number] Date of Birth: Apr 30, 1941 Admit Type: Inpatient Age: 75 Room: [facility] ENDO ROOM 4 Gender: Female Note Status: Finalized Procedure:            Colonoscopy Indications:          High risk colon cancer surveillance: Personal history                        of colon cancer Providers:            Manya Silvas, MD Referring MD:         Kirstie Peri. Caryn Section, MD (Referring MD) Medicines:            General Anesthesia Complications:        No immediate complications. Procedure:            Pre-Anesthesia Assessment:                       - After reviewing the risks and benefits, the patient                        was deemed in satisfactory condition to undergo the                        procedure.                       After obtaining informed consent, the colonoscope was                        passed under direct vision. Throughout the procedure,                        the patient's blood pressure, pulse, and oxygen                        saturations were monitored continuously. The                        Colonoscope was introduced through the anus and                        advanced to the the cecum, identified by appendiceal                        orifice and ileocecal valve. The colonoscopy was                        somewhat difficult due to significant looping.                        Successful completion of the procedure was aided by                        applying abdominal pressure. The patient tolerated the                        procedure well. The quality of the bowel preparation  was excellent. Findings:      A 12 mm polyp was found in the cecum. The polyp was sessile. The polyp       was removed with a saline injection-lift technique using a hot snare.       Resection and retrieval were complete. To prevent  bleeding after the       polypectomy, two hemostatic clips were successfully placed. There was no       bleeding during, or at the end, of the procedure.      Two sessile polyps were found in the rectum and sigmoid colon. The       polyps were diminutive in size. These polyps were removed with a cold       biopsy forceps. Resection and retrieval were complete.      Internal hemorrhoids were found during endoscopy. The hemorrhoids were       small and Grade I (internal hemorrhoids that do not prolapse).      The exam was otherwise without abnormality. Impression:           - One 12 mm polyp in the cecum, removed using                        injection-lift and a hot snare. Resected and retrieved.                        Clips were placed.                       - Two diminutive polyps in the rectum and in the                        sigmoid colon, removed with a cold biopsy forceps.                        Resected and retrieved.                       - Internal hemorrhoids.                       - The examination was otherwise normal. Recommendation:       - Await pathology results. Manya Silvas, MD 02/04/2016 12:50:30 PM This report has been signed electronically. Number of Addenda: 0 Note Initiated On: 02/04/2016 11:44 AM Scope Withdrawal Time: 0 hours 18 minutes 42 seconds  Total Procedure Duration: 0 hours 30 minutes 3 seconds       [facility]

## 2016-02-04 NOTE — Anesthesia Procedure Notes (Signed)
Procedure Name: Intubation Date/Time: 02/04/2016 12:00 PM Performed by: Lorie Apley Pre-anesthesia Checklist: Patient identified, Emergency Drugs available, Suction available, Patient being monitored and Timeout performed Patient Re-evaluated:Patient Re-evaluated prior to inductionOxygen Delivery Method: Circle system utilized and Simple face mask Preoxygenation: Pre-oxygenation with 100% oxygen Intubation Type: IV induction Ventilation: Mask ventilation without difficulty Laryngoscope Size: Mac and 3 Grade View: Grade I Tube type: Oral Tube size: 7.0 mm Number of attempts: 1 Placement Confirmation: ETT inserted through vocal cords under direct vision and positive ETCO2 Secured at: 20 cm Dental Injury: Teeth and Oropharynx as per pre-operative assessment

## 2016-02-04 NOTE — ED Notes (Signed)
Per MD, IV is to be left in place when pt is transported to same-day surgery. This RN to accompany pt

## 2016-02-04 NOTE — Anesthesia Postprocedure Evaluation (Signed)
Anesthesia Post Note  Patient: Ann Young  Procedure(s) Performed: Procedure(s) (LRB): COLONOSCOPY WITH PROPOFOL (N/A) ESOPHAGOGASTRODUODENOSCOPY (EGD) WITH PROPOFOL (N/A)  Patient location during evaluation: PACU Anesthesia Type: General Level of consciousness: awake and alert Pain management: pain level controlled Vital Signs Assessment: post-procedure vital signs reviewed and stable Respiratory status: nonlabored ventilation, respiratory function stable and spontaneous breathing Cardiovascular status: blood pressure returned to baseline and stable Postop Assessment: no signs of nausea or vomiting Anesthetic complications: no    Last Vitals:  Vitals:   02/04/16 1034 02/04/16 1259  BP: 123/61 108/65  Pulse: 69 (!) 110  Resp: 14 16  Temp: 36.7 C 36.7 C    Last Pain:  Vitals:   02/04/16 1034  TempSrc: Tympanic  PainSc:                  Countess Biebel

## 2016-02-04 NOTE — Transfer of Care (Signed)
Immediate Anesthesia Transfer of Care Note  Patient: Shawanda E Kosik  Procedure(s) Performed: Procedure(s): COLONOSCOPY WITH PROPOFOL (N/A) ESOPHAGOGASTRODUODENOSCOPY (EGD) WITH PROPOFOL (N/A)  Patient Location: PACU  Anesthesia Type:General  Level of Consciousness: awake, alert  and oriented  Airway & Oxygen Therapy: Patient Spontanous Breathing and Patient connected to face mask oxygen  Post-op Assessment: Report given to RN and Post -op Vital signs reviewed and stable  Post vital signs: stable  Last Vitals:  Vitals:   02/04/16 0900 02/04/16 1034  BP: 137/75 123/61  Pulse: 68 69  Resp: 16 14  Temp:  36.7 C    Last Pain:  Vitals:   02/04/16 1034  TempSrc: Tympanic  PainSc:          Complications: No apparent anesthesia complications

## 2016-02-05 ENCOUNTER — Encounter: Payer: Self-pay | Admitting: Unknown Physician Specialty

## 2016-02-05 LAB — SURGICAL PATHOLOGY

## 2016-02-09 ENCOUNTER — Telehealth: Payer: Self-pay | Admitting: Family Medicine

## 2016-02-09 DIAGNOSIS — E876 Hypokalemia: Secondary | ICD-10-CM

## 2016-02-09 NOTE — Telephone Encounter (Signed)
Pt states she is feeling weak and has no energy.  Pt states she had a colonoscopy last week and Dr Vira Agar told pt her potassium was low.  Pt is asking if she can pick up a lab slip to have her potassium level checked.  [id number]

## 2016-02-09 NOTE — Telephone Encounter (Signed)
Please review. Thanks!  

## 2016-02-10 ENCOUNTER — Other Ambulatory Visit: Payer: Self-pay

## 2016-02-10 DIAGNOSIS — F411 Generalized anxiety disorder: Secondary | ICD-10-CM | POA: Diagnosis not present

## 2016-02-10 DIAGNOSIS — F330 Major depressive disorder, recurrent, mild: Secondary | ICD-10-CM | POA: Diagnosis not present

## 2016-02-10 DIAGNOSIS — E876 Hypokalemia: Secondary | ICD-10-CM

## 2016-02-10 NOTE — Telephone Encounter (Signed)
L/M saying that lab slip has been printed. Also L/M saying that patient will need to schedule a F/U appt.

## 2016-02-11 LAB — COMPREHENSIVE METABOLIC PANEL
A/G RATIO: 2 (ref 1.2–2.2)
ALT: 15 IU/L (ref 0–32)
AST: 16 IU/L (ref 0–40)
Albumin: 4.5 g/dL (ref 3.5–4.8)
Alkaline Phosphatase: 37 IU/L — ABNORMAL LOW (ref 39–117)
BILIRUBIN TOTAL: 0.4 mg/dL (ref 0.0–1.2)
BUN/Creatinine Ratio: 20 (ref 12–28)
BUN: 15 mg/dL (ref 8–27)
CHLORIDE: 99 mmol/L (ref 96–106)
CO2: 30 mmol/L — ABNORMAL HIGH (ref 18–29)
Calcium: 9.8 mg/dL (ref 8.7–10.3)
Creatinine, Ser: 0.76 mg/dL (ref 0.57–1.00)
GFR calc non Af Amer: 78 mL/min/{1.73_m2} (ref >59)
GFR, EST AFRICAN AMERICAN: 90 mL/min/{1.73_m2} (ref >59)
GLUCOSE: 105 mg/dL — AB (ref 65–99)
Globulin, Total: 2.3 g/dL (ref 1.5–4.5)
POTASSIUM: 3.8 mmol/L (ref 3.5–5.2)
Sodium: 142 mmol/L (ref 134–144)
TOTAL PROTEIN: 6.8 g/dL (ref 6.0–8.5)

## 2016-02-11 LAB — CBC WITH DIFFERENTIAL/PLATELET
BASOS ABS: 0 10*3/uL (ref 0.0–0.2)
Basos: 1 %
EOS (ABSOLUTE): 0.1 10*3/uL (ref 0.0–0.4)
Eos: 1 %
Hematocrit: 38.9 % (ref 34.0–46.6)
Hemoglobin: 13 g/dL (ref 11.1–15.9)
IMMATURE GRANS (ABS): 0 10*3/uL (ref 0.0–0.1)
IMMATURE GRANULOCYTES: 0 %
LYMPHS: 21 %
Lymphocytes Absolute: 1 10*3/uL (ref 0.7–3.1)
MCH: 31.9 pg (ref 26.6–33.0)
MCHC: 33.4 g/dL (ref 31.5–35.7)
MCV: 96 fL (ref 79–97)
MONOS ABS: 0.5 10*3/uL (ref 0.1–0.9)
Monocytes: 10 %
NEUTROS ABS: 3.3 10*3/uL (ref 1.4–7.0)
NEUTROS PCT: 67 %
PLATELETS: 158 10*3/uL (ref 150–379)
RBC: 4.07 x10E6/uL (ref 3.77–5.28)
RDW: 13.1 % (ref 12.3–15.4)
WBC: 4.9 10*3/uL (ref 3.4–10.8)

## 2016-02-12 ENCOUNTER — Telehealth: Payer: Self-pay | Admitting: Family Medicine

## 2016-02-12 NOTE — Telephone Encounter (Signed)
-----   Message from Margo Common, [place] sent at 02/12/2016  8:19 AM EDT ----- Blood tests essentially normal. Potassium no longer low.

## 2016-02-12 NOTE — Telephone Encounter (Signed)
Patient advised.

## 2016-02-12 NOTE — Telephone Encounter (Signed)
Pt called for her lab results.  Call back is [phone]  Thanks Con Memos

## 2016-02-13 ENCOUNTER — Ambulatory Visit: Payer: Self-pay | Admitting: Family Medicine

## 2016-02-15 ENCOUNTER — Encounter: Payer: Self-pay | Admitting: Emergency Medicine

## 2016-02-15 ENCOUNTER — Emergency Department
Admission: EM | Admit: 2016-02-15 | Discharge: 2016-02-15 | Disposition: A | Discharge status: Home / Self Care | Payer: Medicare Other | Attending: Emergency Medicine | Admitting: Emergency Medicine

## 2016-02-15 ENCOUNTER — Emergency Department: Payer: Medicare Other

## 2016-02-15 DIAGNOSIS — R0600 Dyspnea, unspecified: Secondary | ICD-10-CM

## 2016-02-15 DIAGNOSIS — N390 Urinary tract infection, site not specified: Secondary | ICD-10-CM | POA: Insufficient documentation

## 2016-02-15 DIAGNOSIS — J45909 Unspecified asthma, uncomplicated: Secondary | ICD-10-CM | POA: Diagnosis not present

## 2016-02-15 DIAGNOSIS — Z79899 Other long term (current) drug therapy: Secondary | ICD-10-CM | POA: Diagnosis not present

## 2016-02-15 DIAGNOSIS — R0602 Shortness of breath: Secondary | ICD-10-CM | POA: Diagnosis not present

## 2016-02-15 DIAGNOSIS — R079 Chest pain, unspecified: Secondary | ICD-10-CM | POA: Diagnosis not present

## 2016-02-15 DIAGNOSIS — Z7982 Long term (current) use of aspirin: Secondary | ICD-10-CM | POA: Diagnosis not present

## 2016-02-15 LAB — BASIC METABOLIC PANEL
Anion gap: 10 (ref 5–15)
BUN: 13 mg/dL (ref 6–20)
CALCIUM: 9.8 mg/dL (ref 8.9–10.3)
CO2: 26 mmol/L (ref 22–32)
CREATININE: 0.84 mg/dL (ref 0.44–1.00)
Chloride: 103 mmol/L (ref 101–111)
GFR calc non Af Amer: >60 mL/min (ref >60)
Glucose, Bld: 91 mg/dL (ref 65–99)
Potassium: 4.3 mmol/L (ref 3.5–5.1)
SODIUM: 139 mmol/L (ref 135–145)

## 2016-02-15 LAB — URINALYSIS COMPLETE WITH MICROSCOPIC (ARMC ONLY)
BILIRUBIN URINE: NEGATIVE
Bacteria, UA: NONE SEEN
GLUCOSE, UA: NEGATIVE mg/dL
HGB URINE DIPSTICK: NEGATIVE
Nitrite: NEGATIVE
Protein, ur: NEGATIVE mg/dL
Specific Gravity, Urine: 1.015 (ref 1.005–1.030)
pH: 6 (ref 5.0–8.0)

## 2016-02-15 LAB — CBC
HCT: 44.2 % (ref 35.0–47.0)
Hemoglobin: 15.5 g/dL (ref 12.0–16.0)
MCH: 32.3 pg (ref 26.0–34.0)
MCHC: 35 g/dL (ref 32.0–36.0)
MCV: 92.3 fL (ref 80.0–100.0)
PLATELETS: 169 10*3/uL (ref 150–440)
RBC: 4.79 MIL/uL (ref 3.80–5.20)
RDW: 12.8 % (ref 11.5–14.5)
WBC: 6.9 10*3/uL (ref 3.6–11.0)

## 2016-02-15 LAB — TROPONIN I

## 2016-02-15 MED ORDER — CEPHALEXIN 500 MG PO CAPS: 500.0000 mg | ORAL_CAPSULE | Freq: Three times a day (TID) | ORAL | 0 refills | Status: AC

## 2016-02-15 MED ORDER — FLUCONAZOLE 150 MG PO TABS: 150.0000 mg | ORAL_TABLET | Freq: Every day | ORAL | 0 refills | Status: DC

## 2016-02-15 MED ORDER — CEPHALEXIN 500 MG PO CAPS
500.0000 mg | ORAL_CAPSULE | Freq: Once | ORAL | Status: AC
  Administered 2016-02-15: 500 mg via ORAL
  Filled 2016-02-15: qty 1

## 2016-02-15 NOTE — ED Provider Notes (Signed)
[facility] Emergency Department Provider Note   ____________________________________________   First MD Initiated Contact with Patient 02/15/16 1716     (approximate)  I have reviewed the triage vital signs and the nursing notes.   HISTORY  Chief Complaint Dizziness and Chest Pain   HPI Ann Young is a 75 y.o. female with a history of anxiety as well as depression and GERD who is presenting to the emergency department today with ongoing chest pain over the past several months. She says that the chest pain is intermittent and is worse when she is anxious. She says it is associated with shortness of breath. She says that he can last anywhere from several minutes up to an hour to 2 hours. She denies any pain or shortness of breath at this time but says that she does have shortness of breath with the pain. She says that she can lie down, herself and the pain will go away. She says that she also hears herself "wheezing in her neck" when she has these episodes.  She is accompanied by her daughter who says that the patient has been increasingly anxious since this past 2022-11-27 when her husband died. The daughter also says that there is been family that usually checks on her daily that has been away for the past 2 days and says that the patient is likely even more anxious as a result.  Patient also notes associated dizziness and says that she has had some left-sided flank pain and is concerned about a urinary tract infection.  Past Medical History:  Diagnosis Date  . Anxiety   . Arthritis   . Asthma   . Cancer ([place])   . Depression   . Dyspepsia   . Elevated cholesterol   . GERD (gastroesophageal reflux disease)   . History of hiatal hernia   . Peptic ulcer disease     Patient Active Problem List   Diagnosis Date Noted  . History of allergy 08/18/2015  . Osteoporosis 05/07/2015  . Constipation, chronic 05/07/2015  . History of colorectal cancer  05/07/2015  . Depression with anxiety 05/07/2015  . Arthritis 05/07/2015  . Mixed hyperlipidemia 05/07/2015  . History of colon polyps 05/07/2015  . GERD with stricture 05/07/2015  . Low back pain 02/10/2015  . Problems influencing health status 10/28/2014  . Arthritis 10/28/2014  . Malignant neoplasm ([place]) 10/28/2014  . History of colonic polyps 10/28/2014  . Chronic constipation 10/28/2014  . Anxiety and depression 10/28/2014  . Acid reflux 10/28/2014  . History of malignant neoplasm of large intestine 10/28/2014  . 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 OP (osteoporosis) 10/28/2014  . Gastric ulcer 10/28/2014  . 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 Awareness of heartbeats 03/25/2014    Past Surgical History:  Procedure Laterality Date  . BREAST BIOPSY Left 2000   benign  . CARDIAC CATHETERIZATION  2015   WNL  . CATARACT EXTRACTION Bilateral 2013  . CATARACT EXTRACTION, BILATERAL Bilateral 2013   Dr. Willy Eddy  . COLONOSCOPY WITH PROPOFOL N/A 02/04/2016   Procedure: COLONOSCOPY WITH PROPOFOL;  Surgeon: Manya Silvas, MD;  [facility];  Service: Endoscopy;  Laterality: N/A;  . ESOPHAGOGASTRODUODENOSCOPY (EGD) WITH PROPOFOL N/A 02/04/2016   Procedure: ESOPHAGOGASTRODUODENOSCOPY (EGD) WITH PROPOFOL;  Surgeon: Manya Silvas, MD;  Location: [facility] ENDOSCOPY;  Service: Endoscopy;  Laterality: N/A;  . resection of colorectal cancer without sequela  1999  . resection of colorectal cancer without sequela    .  TUBAL LIGATION  1976    Prior to Admission medications   Medication Sig Start Date End Date Taking? Authorizing Provider  alendronate (FOSAMAX) 70 MG tablet Take 70 mg by mouth once a week. Reported on 08/18/2015    Historical Provider, MD  aspirin 81 MG tablet Take 81 mg by mouth daily.    Historical Provider, MD  Calcium Carb-Cholecalciferol 600-200 MG-UNIT TABS Take 1 tablet by mouth 2 (two) times daily.     Historical Provider, MD  cetirizine (ZYRTEC) 10 MG tablet TAKE ONE TABLET BY MOUTH ONCE DAILY FOR ALLERGIES 01/08/16   Vickki Muff Chrismon, PA  clonazePAM (KLONOPIN) 1 MG tablet Take 1 tablet (1 mg total) by mouth at bedtime. 08/18/15   Vickki Muff Chrismon, PA  DULoxetine (CYMBALTA) 20 MG capsule Take 1 capsule by mouth daily. 01/02/16   Historical Provider, MD  lovastatin (MEVACOR) 40 MG tablet Take 1 tablet (40 mg total) by mouth at bedtime. 08/18/15   Vickki Muff Chrismon, PA  Multiple Vitamins-Minerals (MULTIVITAMIN PO) Take by mouth.    Historical Provider, MD  Omega-3 Fatty Acids (FISH OIL PO) Take by mouth.    Historical Provider, MD  omeprazole (PRILOSEC) 20 MG capsule Take 1 capsule (20 mg total) by mouth 2 (two) times daily. 08/18/15   Vickki Muff Chrismon, PA  ondansetron (ZOFRAN ODT) 4 MG disintegrating tablet Take 1 tablet (4 mg total) by mouth every 8 (eight) hours as needed for nausea or vomiting. 02/04/16   Earleen Newport, MD  ranitidine (ZANTAC) 150 MG tablet Take 150 mg by mouth daily.    Historical Provider, MD  traZODone (DESYREL) 100 MG tablet Take 100 mg by mouth at bedtime.    Historical Provider, MD  Vitamin D, Cholecalciferol, 1000 UNITS CAPS Take 1 capsule by mouth daily.    Historical Provider, MD    Allergies Clarithromycin; Ibuprofen; Lipitor  [atorvastatin]; Metronidazole; Other; Penicillins; Simvastatin; Sulfa antibiotics; and Toviaz  [fesoterodine fumarate er]  Family History  Problem Relation Age of Onset  . Cirrhosis Sister   . Colon polyps Sister   . Leukemia Mother   . Depression Mother   . Heart disease Father   . Depression Father   . Bipolar disorder Daughter     Social History Social History  Substance Use Topics  . Smoking status: Never Smoker  . Smokeless tobacco: Never Used  . Alcohol use No    Review of Systems Constitutional: No fever/chills Eyes: No visual changes. ENT: No sore throat. Cardiovascular: As above Respiratory: As  above Gastrointestinal: No abdominal pain.  No nausea, no vomiting.  No diarrhea.  No constipation. Genitourinary: Negative for dysuria. Musculoskeletal: As above. Skin: Negative for rash. Neurological: Negative for headaches, focal weakness or numbness.  10-point ROS otherwise negative.  ____________________________________________   PHYSICAL EXAM:  VITAL SIGNS: ED Triage Vitals [02/15/16 1534]  Enc Vitals Group     BP 131/73     Pulse Rate 94     Resp 20     Temp 97.6 F (36.4 C)     Temp Source Oral     SpO2 98 %     Weight 111 lb (50.3 kg)     Height 5' (1.524 m)     Head Circumference      Peak Flow      Pain Score 8     Pain Loc      Pain Edu?      Excl. in [place]?     Constitutional: Alert and oriented.  Well appearing and in no acute distress. Eyes: Conjunctivae are normal. PERRL. EOMI. Head: Atraumatic. Nose: No congestion/rhinnorhea. Mouth/Throat: Mucous membranes are moist.   Neck: No stridor.   Cardiovascular: Normal rate, regular rhythm. Grossly normal heart sounds.  Good peripheral circulation with intact bilateral radial as well as dorsalis pedis pulses. Respiratory: Normal respiratory effort.  No retractions. Lungs CTAB. Gastrointestinal: Soft and nontender. No distention. No abdominal bruits. No CVA tenderness. Musculoskeletal: No lower extremity tenderness nor edema.  No joint effusions. Neurologic:  Normal speech and language. No gross focal neurologic deficits are appreciated. No gait instability. Skin:  Skin is warm, dry and intact. No rash noted. Psychiatric: Mood and affect are normal. Speech and behavior are normal.  ____________________________________________   LABS (all labs ordered are listed, but only abnormal results are displayed)  Labs Reviewed  URINALYSIS COMPLETEWITH MICROSCOPIC ([place] ONLY) - Abnormal; Notable for the following:       Result Value   Color, Urine YELLOW (*)    APPearance CLEAR (*)    Ketones, ur 2+ (*)     Leukocytes, UA 3+ (*)    Squamous Epithelial / LPF 0-5 (*)    All other components within normal limits  URINE CULTURE  BASIC METABOLIC PANEL  CBC  TROPONIN I   ____________________________________________  EKG  ED ECG REPORT I, Doran Stabler, the attending physician, personally viewed and interpreted this ECG.   Date: 02/15/2016  EKG Time: 1540  Rate: 84  Rhythm: normal sinus rhythm  Axis: Normal axis  Intervals:none  ST&T Change: No ST segment elevation or depression. No abnormal T-wave inversion.  ____________________________________________  RADIOLOGY  DG Chest 2 View (Accession [id number]) (Order [id number])  Imaging  Date: 02/15/2016 Department: [place] Released By: Ulyses Amor, RN (auto-released) Authorizing: Orbie Pyo, MD  PACS Images   Show images for DG Chest 2 View  Study Result   CLINICAL DATA:  Midsternal chest pain for several months, worsening in the last 2 days. Shortness of breath.  EXAM: CHEST  2 VIEW  COMPARISON:  01/20/2016  FINDINGS: Heart size is normal. Lungs are free of focal consolidations and pleural effusions. No pulmonary edema. Mild left lower lobe atelectasis. Visualized osseous structures have a normal appearance.  IMPRESSION: No evidence for acute cardiopulmonary abnormality.   Electronically Signed   By: Nolon Nations M.D.   On: 02/15/2016 16:12     ____________________________________________   PROCEDURES  Procedure(s) performed:   Procedures  Critical Care performed:   ____________________________________________   INITIAL IMPRESSION / ASSESSMENT AND PLAN / ED COURSE  Pertinent labs & imaging results that were available during my care of the patient were reviewed by me and considered in my medical decision making (see chart for details).  ----------------------------------------- 8:21 PM on  02/15/2016 -----------------------------------------  Pt resting comfortably.  Likely UTI.  No cp or sob at this time.  Unlikely to be acs/pe with very reassuring w/u after months of symptoms.  Will be following with cards, kowalski.  To dc to home.    Clinical Course     ____________________________________________   FINAL CLINICAL IMPRESSION(S) / ED DIAGNOSES  Chest pain. Dyspnea.  UTI.      NEW MEDICATIONS STARTED DURING THIS VISIT:  New Prescriptions   No medications on file     Note:  This document was prepared using Dragon voice recognition software and may include unintentional dictation errors.    Orbie Pyo, MD 02/15/16 2022

## 2016-02-15 NOTE — ED Notes (Signed)
Pt states she gets "episodes" where she feels like she can't breathe. States feels better now since coming into hospital. Also states feels like she has a uti. Urinalysis added on.

## 2016-02-15 NOTE — ED Triage Notes (Signed)
Pt to ED with c/o dizziness, lightheadness and wheezing. Pt has a hx of asthma.  Pt reports she gets "these spells" several times a day for the past few months.  Pt states she has been having shortness of breath with exertion as well.  Pt is not in an respiratory distress.

## 2016-02-17 DIAGNOSIS — H15102 Unspecified episcleritis, left eye: Secondary | ICD-10-CM | POA: Diagnosis not present

## 2016-02-17 LAB — URINE CULTURE

## 2016-02-26 ENCOUNTER — Ambulatory Visit (INDEPENDENT_AMBULATORY_CARE_PROVIDER_SITE_OTHER): Payer: Medicare Other | Admitting: Family Medicine

## 2016-02-26 ENCOUNTER — Encounter: Payer: Self-pay | Admitting: Family Medicine

## 2016-02-26 VITALS — BP 108/60 | HR 84 | Temp 97.8°F | Resp 14 | Wt 109.0 lb

## 2016-02-26 DIAGNOSIS — R1032 Left lower quadrant pain: Secondary | ICD-10-CM

## 2016-02-26 LAB — POCT URINALYSIS DIPSTICK
Glucose, UA: NEGATIVE
KETONES UA: 15
NITRITE UA: NEGATIVE
PH UA: 5
Spec Grav, UA: >=1.03
Urobilinogen, UA: 0.2

## 2016-02-26 MED ORDER — CIPROFLOXACIN HCL 500 MG PO TABS: 500.0000 mg | ORAL_TABLET | Freq: Two times a day (BID) | ORAL | 0 refills | Status: DC

## 2016-02-26 MED ORDER — PHENAZOPYRIDINE HCL 200 MG PO TABS: 200.0000 mg | ORAL_TABLET | Freq: Three times a day (TID) | ORAL | 0 refills | Status: DC | PRN

## 2016-02-26 NOTE — Progress Notes (Signed)
Patient: Ann Young Female    DOB: 05/25/1941   75 y.o.   MRN: [id number] Visit Date: 02/26/2016  Today's Provider: Vernie Murders, PA   Chief Complaint  Patient presents with  . Hospitalization Follow-up   Subjective:    HPI  Follow up Hospitalization  Patient was admitted to [facility] on 02/15/2016 and discharged on 02/15/2016. She was treated for UTI. Treatment for this included started Cephalexin and Fluconazole. She reports excellent compliance with treatment. She reports this condition is Improved but still having LLQ pain radiating to lower back.  ------------------------------------------------------------------------------------  Past Medical History:  Diagnosis Date  . Anxiety   . Arthritis   . Asthma   . Cancer ([place])   . Depression   . Dyspepsia   . Elevated cholesterol   . GERD (gastroesophageal reflux disease)   . History of hiatal hernia   . Peptic ulcer disease    Past Surgical History:  Procedure Laterality Date  . BREAST BIOPSY Left 2000   benign  . CARDIAC CATHETERIZATION  2015   WNL  . CATARACT EXTRACTION Bilateral 2013  . CATARACT EXTRACTION, BILATERAL Bilateral 2013   Dr. Willy Eddy  . COLONOSCOPY WITH PROPOFOL N/A 02/04/2016   Procedure: COLONOSCOPY WITH PROPOFOL;  Surgeon: Manya Silvas, MD;  Location: [facility] ENDOSCOPY;  Service: Endoscopy;  Laterality: N/A;  . ESOPHAGOGASTRODUODENOSCOPY (EGD) WITH PROPOFOL N/A 02/04/2016   Procedure: ESOPHAGOGASTRODUODENOSCOPY (EGD) WITH PROPOFOL;  Surgeon: Manya Silvas, MD;  Location: [facility] ENDOSCOPY;  Service: Endoscopy;  Laterality: N/A;  . resection of colorectal cancer without sequela  1999  . resection of colorectal cancer without sequela    . TUBAL LIGATION  1976   Family History  Problem Relation Age of Onset  . Cirrhosis Sister   . Colon polyps Sister   . Leukemia Mother   . Depression Mother   . Heart disease Father   . Depression Father   . Bipolar disorder Daughter    Allergies    Allergen Reactions  . Clarithromycin Other (See Comments)    Questionable Biaxin vs Flagyl with chest discomfort & irregular heartbeat (most likely thought to be related to the Biaxin)  . Ibuprofen Other (See Comments)    To prevent swelling,increase hr/bp  . Lipitor  [Atorvastatin] Other (See Comments)    Mayalgia  . Metronidazole Other (See Comments)    Questionable Biaxin vs Flagyl with chest discomfort & irregular heartbeat (most likely thought to be related to the Biaxin)  . Other Other (See Comments)    decongestants  . Penicillins   . Simvastatin Other (See Comments)    Hari Loss  . Sulfa Antibiotics   . Toviaz  [Fesoterodine Fumarate Er] Other (See Comments)    weakness     Previous Medications   ALENDRONATE (FOSAMAX) 70 MG TABLET    Take 70 mg by mouth once a week. Reported on 08/18/2015   ASPIRIN 81 MG TABLET    Take 81 mg by mouth daily.   CALCIUM CARB-CHOLECALCIFEROL 600-200 MG-UNIT TABS    Take 1 tablet by mouth 2 (two) times daily.   CETIRIZINE (ZYRTEC) 10 MG TABLET    TAKE ONE TABLET BY MOUTH ONCE DAILY FOR ALLERGIES   CLONAZEPAM (KLONOPIN) 1 MG TABLET    Take 1 tablet (1 mg total) by mouth at bedtime.   DULOXETINE (CYMBALTA) 20 MG CAPSULE    Take 1 capsule by mouth daily.   FLUCONAZOLE (DIFLUCAN) 150 MG TABLET    Take 1 tablet (150 mg total) by  mouth daily. As needed for yeast infection   LOVASTATIN (MEVACOR) 40 MG TABLET    Take 1 tablet (40 mg total) by mouth at bedtime.   MULTIPLE VITAMINS-MINERALS (MULTIVITAMIN PO)    Take by mouth.   OMEGA-3 FATTY ACIDS (FISH OIL PO)    Take by mouth.   OMEPRAZOLE (PRILOSEC) 20 MG CAPSULE    Take 1 capsule (20 mg total) by mouth 2 (two) times daily.   ONDANSETRON (ZOFRAN ODT) 4 MG DISINTEGRATING TABLET    Take 1 tablet (4 mg total) by mouth every 8 (eight) hours as needed for nausea or vomiting.   RANITIDINE (ZANTAC) 150 MG TABLET    Take 150 mg by mouth daily.   TRAZODONE (DESYREL) 100 MG TABLET    Take 100 mg by mouth at  bedtime.   VITAMIN D, CHOLECALCIFEROL, 1000 UNITS CAPS    Take 1 capsule by mouth daily.    Review of Systems  Constitutional: Negative.   Respiratory: Negative.   Cardiovascular: Negative.   Gastrointestinal: Positive for abdominal pain.  Genitourinary: Negative.   Musculoskeletal: Positive for back pain.    Social History  Substance Use Topics  . Smoking status: Never Smoker  . Smokeless tobacco: Never Used  . Alcohol use No   Objective:   BP 108/60 (BP Location: Right Arm, Patient Position: Sitting, Cuff Size: Normal)   Pulse 84   Temp 97.8 F (36.6 C) (Oral)   Resp 14   Wt 109 lb (49.4 kg)   BMI 21.29 kg/m   Physical Exam  Constitutional: She is oriented to person, place, and time. She appears well-developed and well-nourished. No distress.  HENT:  Head: Normocephalic and atraumatic.  Right Ear: Hearing normal.  Left Ear: Hearing normal.  Nose: Nose normal.  Eyes: Conjunctivae and lids are normal. Right eye exhibits no discharge. Left eye exhibits no discharge. No scleral icterus.  Pulmonary/Chest: Effort normal. No respiratory distress.  Abdominal: Soft. Bowel sounds are normal. She exhibits no mass. There is tenderness. There is no guarding.  Soreness suprapubic and LLQ of abdomen.  Musculoskeletal: Normal range of motion.  Neurological: She is alert and oriented to person, place, and time.  Skin: Skin is intact. No lesion and no rash noted.  Psychiatric: Her speech is normal and behavior is normal. Thought content normal. Her mood appears anxious. She exhibits a depressed mood.      Assessment & Plan:     1. Abdominal pain, LLQ (left lower quadrant) Was seen in the ER on 02-15-16 with signs of UTI. Culture showed multiple pathogens and recommended repeat culture. Urinalysis today is concentrated with trace of blood and leukocytes. No relief since finishing the Keflex. Will give Pyridium for suprapubic pressure and LLQ discomfort. Change to Cipro for infection and  follow up pending culture report. - POCT Urinalysis Dipstick - Urine culture - ciprofloxacin (CIPRO) 500 MG tablet; Take 1 tablet (500 mg total) by mouth 2 (two) times daily.  Dispense: 20 tablet; Refill: 0 - phenazopyridine (PYRIDIUM) 200 MG tablet; Take 1 tablet (200 mg total) by mouth 3 (three) times daily as needed for pain.  Dispense: 10 tablet; Refill: 0

## 2016-02-27 DIAGNOSIS — F331 Major depressive disorder, recurrent, moderate: Secondary | ICD-10-CM | POA: Diagnosis not present

## 2016-02-28 LAB — URINE CULTURE

## 2016-03-01 ENCOUNTER — Telehealth: Payer: Self-pay

## 2016-03-01 NOTE — Telephone Encounter (Signed)
Advised pt of lab results. Pt verbally acknowledges understanding. FU scheduled. Jonty Morrical Drozdowski, CMA   

## 2016-03-01 NOTE — Telephone Encounter (Signed)
-----   Message from Margo Common, [place] sent at 03/01/2016  8:26 AM EDT ----- Still has a mix of bacteria on the culture. Finish the Cipro and recheck urinalysis in 7-10 days. Will need to be sure the recheck if a clean catch specimen for a repeat culture. If no better at that time, will need an evaluation by an urologist.

## 2016-03-04 ENCOUNTER — Telehealth: Payer: Self-pay | Admitting: Family Medicine

## 2016-03-04 NOTE — Telephone Encounter (Signed)
Recommend Monistat Vaginal cream - OTC. If no better in 4-5 days, should schedule appointment.

## 2016-03-04 NOTE — Telephone Encounter (Signed)
Pt states she has been taking an antibiotic and is having burning and itching in her vaginal area. Pt is requesting something to help with this.  [place]  [id number]

## 2016-03-05 NOTE — Telephone Encounter (Signed)
Patient advised. Patient has a follow up appointment scheduled for 03/08/2016

## 2016-03-08 ENCOUNTER — Encounter: Payer: Self-pay | Admitting: Family Medicine

## 2016-03-08 ENCOUNTER — Ambulatory Visit (INDEPENDENT_AMBULATORY_CARE_PROVIDER_SITE_OTHER): Payer: Medicare Other | Admitting: Family Medicine

## 2016-03-08 VITALS — BP 108/60 | HR 68 | Temp 97.7°F | Resp 14 | Wt 111.0 lb

## 2016-03-08 DIAGNOSIS — R109 Unspecified abdominal pain: Secondary | ICD-10-CM | POA: Diagnosis not present

## 2016-03-08 DIAGNOSIS — Z23 Encounter for immunization: Secondary | ICD-10-CM | POA: Diagnosis not present

## 2016-03-08 DIAGNOSIS — N390 Urinary tract infection, site not specified: Secondary | ICD-10-CM | POA: Diagnosis not present

## 2016-03-08 LAB — POCT URINALYSIS DIPSTICK
BILIRUBIN UA: NEGATIVE
Glucose, UA: NEGATIVE
Ketones, UA: NEGATIVE
Leukocytes, UA: NEGATIVE
NITRITE UA: NEGATIVE
PH UA: 6
Protein, UA: NEGATIVE
RBC UA: NEGATIVE
Spec Grav, UA: 1.01
Urobilinogen, UA: 0.2

## 2016-03-08 NOTE — Progress Notes (Signed)
Patient: Ann Young Female    DOB: 17-Aug-1940   75 y.o.   MRN: [id number] Visit Date: 03/08/2016  Today's Provider: Vernie Murders, PA   Chief Complaint  Patient presents with  . Urinary Tract Infection  . Follow-up   Subjective:    HPI Patient is here today to follow up from office visit on 02/26/2016. Patient was treated for a UTI. Patient finished Keflex. Patient was prescribed Cipro 500 mg and Pyridium. Patient reports excellent compliance to treatment plan. Patient completed antibiotics on Saturday. Patient states she is still having abdominal pressure and a burning sensation constantly. Patient needs a clean catch urine culture to recheck UTI.  Patient Active Problem List   Diagnosis Date Noted  . History of allergy 08/18/2015  . Osteoporosis 05/07/2015  . Constipation, chronic 05/07/2015  . History of colorectal cancer 05/07/2015  . Depression with anxiety 05/07/2015  . Arthritis 05/07/2015  . Mixed hyperlipidemia 05/07/2015  . GERD with stricture 05/07/2015  . Low back pain 02/10/2015  . Problems influencing health status 10/28/2014  . Arthritis 10/28/2014  . Malignant neoplasm ([place]) 10/28/2014  . History of colonic polyps 10/28/2014  . Chronic constipation 10/28/2014  . Anxiety and depression 10/28/2014  . Acid reflux 10/28/2014  . History of malignant neoplasm of large intestine 10/28/2014  . 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 OP (osteoporosis) 10/28/2014  . Gastric ulcer 10/28/2014  . 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 Awareness of heartbeats 03/25/2014   Past Surgical History:  Procedure Laterality Date  . BREAST BIOPSY Left 2000   benign  . CARDIAC CATHETERIZATION  2015   WNL  . CATARACT EXTRACTION Bilateral 2013  . CATARACT EXTRACTION, BILATERAL Bilateral 2013   Dr. Willy Eddy  . COLONOSCOPY WITH PROPOFOL N/A 02/04/2016   Procedure: COLONOSCOPY WITH PROPOFOL;  Surgeon: Manya Silvas, MD;   Location: [facility] ENDOSCOPY;  Service: Endoscopy;  Laterality: N/A;  . ESOPHAGOGASTRODUODENOSCOPY (EGD) WITH PROPOFOL N/A 02/04/2016   Procedure: ESOPHAGOGASTRODUODENOSCOPY (EGD) WITH PROPOFOL;  Surgeon: Manya Silvas, MD;  Location: [facility] ENDOSCOPY;  Service: Endoscopy;  Laterality: N/A;  . resection of colorectal cancer without sequela  1999  . resection of colorectal cancer without sequela    . TUBAL LIGATION  1976   Family History  Problem Relation Age of Onset  . Cirrhosis Sister   . Colon polyps Sister   . Leukemia Mother   . Depression Mother   . Heart disease Father   . Depression Father   . Bipolar disorder Daughter    Allergies  Allergen Reactions  . Clarithromycin Other (See Comments)    Questionable Biaxin vs Flagyl with chest discomfort & irregular heartbeat (most likely thought to be related to the Biaxin)  . Ibuprofen Other (See Comments)    To prevent swelling,increase hr/bp  . Lipitor  [Atorvastatin] Other (See Comments)    Mayalgia  . Metronidazole Other (See Comments)    Questionable Biaxin vs Flagyl with chest discomfort & irregular heartbeat (most likely thought to be related to the Biaxin)  . Other Other (See Comments)    decongestants  . Penicillins   . Simvastatin Other (See Comments)    Hari Loss  . Sulfa Antibiotics   . Toviaz  [Fesoterodine Fumarate Er] Other (See Comments)    weakness     Previous Medications   ALENDRONATE (FOSAMAX) 70 MG TABLET    Take 70 mg by mouth once a week. Reported on 08/18/2015   ASPIRIN 81 MG TABLET  Take 81 mg by mouth daily.   CALCIUM CARB-CHOLECALCIFEROL 600-200 MG-UNIT TABS    Take 1 tablet by mouth 2 (two) times daily.   CETIRIZINE (ZYRTEC) 10 MG TABLET    TAKE ONE TABLET BY MOUTH ONCE DAILY FOR ALLERGIES   CLONAZEPAM (KLONOPIN) 1 MG TABLET    Take 1 tablet (1 mg total) by mouth at bedtime.   DULOXETINE (CYMBALTA) 20 MG CAPSULE    Take 1 capsule by mouth daily.   LOVASTATIN (MEVACOR) 40 MG TABLET    Take 1 tablet  (40 mg total) by mouth at bedtime.   MULTIPLE VITAMINS-MINERALS (MULTIVITAMIN PO)    Take by mouth.   OMEGA-3 FATTY ACIDS (FISH OIL PO)    Take by mouth.   OMEPRAZOLE (PRILOSEC) 20 MG CAPSULE    Take 1 capsule (20 mg total) by mouth 2 (two) times daily.   ONDANSETRON (ZOFRAN ODT) 4 MG DISINTEGRATING TABLET    Take 1 tablet (4 mg total) by mouth every 8 (eight) hours as needed for nausea or vomiting.   RANITIDINE (ZANTAC) 150 MG TABLET    Take 150 mg by mouth daily.   TRAZODONE (DESYREL) 100 MG TABLET    Take 100 mg by mouth at bedtime.   VITAMIN D, CHOLECALCIFEROL, 1000 UNITS CAPS    Take 1 capsule by mouth daily.    Review of Systems  Constitutional: Negative.   Respiratory: Negative.   Cardiovascular: Negative.   Genitourinary: Negative.        Abdominal pressure    Social History  Substance Use Topics  . Smoking status: Never Smoker  . Smokeless tobacco: Never Used  . Alcohol use No   Objective:   BP 108/60 (BP Location: Right Arm, Patient Position: Sitting, Cuff Size: Normal)   Pulse 68   Temp 97.7 F (36.5 C) (Oral)   Resp 14   Wt 111 lb (50.3 kg)   BMI 21.68 kg/m  Wt Readings from Last 3 Encounters:  03/08/16 111 lb (50.3 kg)  02/26/16 109 lb (49.4 kg)  02/15/16 111 lb (50.3 kg)    Physical Exam  Constitutional: She is oriented to person, place, and time. She appears well-developed and well-nourished. No distress.  HENT:  Head: Normocephalic and atraumatic.  Right Ear: Hearing normal.  Left Ear: Hearing normal.  Nose: Nose normal.  Eyes: Conjunctivae and lids are normal. Right eye exhibits no discharge. Left eye exhibits no discharge. No scleral icterus.  Pulmonary/Chest: Effort normal. No respiratory distress.  Abdominal: Soft. Bowel sounds are normal.  Minimal pressure sensation in lower suprapubic abdomen. No CVA tenderness posteriorly with percussion.  Musculoskeletal: Normal range of motion.  Neurological: She is alert and oriented to person, place, and  time.  Skin: Skin is intact. No lesion and no rash noted.  Psychiatric: Her speech is normal.      Assessment & Plan:     1. Abdominal pressure Suprapubic pressure minimal today. No diarrhea, constipation, nausea or vomiting. Having some dysuria without gross hematuria. No fever. Urinalysis essentially clear today. Suspect persistent symptoms of UTI. - POCT Urinalysis Dipstick  2. Urinary tract infection without hematuria, site unspecified Still having burning and pressure with urination. Initially treated with Keflex with mixed bacteria on culture from the ER on 02-15-16. Follow up on 02-26-16, was still having pyuria and switched to Cipro. Will repeat culture. Will probably need referral to urologist pending report. - Urine culture  3. Need for immunization against influenza - Flu vaccine HIGH DOSE PF (Fluzone High dose)

## 2016-03-09 DIAGNOSIS — F332 Major depressive disorder, recurrent severe without psychotic features: Secondary | ICD-10-CM | POA: Diagnosis not present

## 2016-03-09 DIAGNOSIS — F411 Generalized anxiety disorder: Secondary | ICD-10-CM | POA: Diagnosis not present

## 2016-03-09 DIAGNOSIS — F410 Panic disorder [episodic paroxysmal anxiety] without agoraphobia: Secondary | ICD-10-CM | POA: Diagnosis not present

## 2016-03-11 ENCOUNTER — Telehealth: Payer: Self-pay | Admitting: Family Medicine

## 2016-03-11 DIAGNOSIS — H43813 Vitreous degeneration, bilateral: Secondary | ICD-10-CM | POA: Diagnosis not present

## 2016-03-11 DIAGNOSIS — H04123 Dry eye syndrome of bilateral lacrimal glands: Secondary | ICD-10-CM | POA: Diagnosis not present

## 2016-03-11 LAB — URINE CULTURE

## 2016-03-11 MED ORDER — NITROFURANTOIN MONOHYD MACRO 100 MG PO CAPS: 100.0000 mg | ORAL_CAPSULE | Freq: Two times a day (BID) | ORAL | 0 refills | Status: DC

## 2016-03-11 NOTE — Telephone Encounter (Signed)
Patient advised. RX sent to [facility] pharmacy  

## 2016-03-11 NOTE — Telephone Encounter (Signed)
Pt wants to know if we have results form her urine culture from earlier in the week.  Her call back is [phone]  Thanks teri

## 2016-03-11 NOTE — Telephone Encounter (Signed)
-----   Message from Margo Common, [place] sent at 03/11/2016 12:59 PM EDT ----- Culture isolated E.coli bacteria in urine. It is resistant to the Cipro this time. Recommend Macrobid 100 mg BID #20 and recheck in 7-10 days if any symptoms remain.

## 2016-03-18 ENCOUNTER — Ambulatory Visit (INDEPENDENT_AMBULATORY_CARE_PROVIDER_SITE_OTHER): Payer: Medicare Other | Admitting: Physician Assistant

## 2016-03-18 ENCOUNTER — Encounter: Payer: Self-pay | Admitting: Physician Assistant

## 2016-03-18 VITALS — BP 112/60 | HR 76 | Temp 98.2°F | Resp 16 | Wt 111.0 lb

## 2016-03-18 DIAGNOSIS — R3915 Urgency of urination: Secondary | ICD-10-CM

## 2016-03-18 DIAGNOSIS — R350 Frequency of micturition: Secondary | ICD-10-CM

## 2016-03-18 LAB — POCT URINALYSIS DIPSTICK
Bilirubin, UA: NEGATIVE
Glucose, UA: NEGATIVE
Ketones, UA: NEGATIVE
Leukocytes, UA: NEGATIVE
Nitrite, UA: NEGATIVE
Protein, UA: NEGATIVE
Spec Grav, UA: <=1.005
Urobilinogen, UA: 0.2
pH, UA: 7.5

## 2016-03-18 NOTE — Patient Instructions (Signed)

## 2016-03-18 NOTE — Progress Notes (Signed)
[place]  Chief Complaint  Patient presents with  . Urinary Tract Infection    Subjective:    Patient ID: Ann Young, female    DOB: [birth year], 75 y.o.   MRN: [id number]   Urinary Tract Infection: Patient complains of frequency and pain in the lower abdomen She has had symptoms for 1 month. Patient denies fever and vaginal discharge. Patient does have a history of recurrent UTI.  Patient does not have a history of pyelonephritis or other renal issues. Patient denies vaginal discharge and denies new sexual partners. The patient denies recent travel outside of the [place].  Patient remains on a course of Macrobid 100 mg BID for 10 days from her most recent urinary tract infection. She has a few more days left of this treatment. She reports that she doesn't have pain per se, but more of a pressure over her suprapubic area. Denies low back pain. Reports that in the past she was treated for over active bladder but stopped taking the medication shortly after it was prescribed.  Patient reports that her husband of 25 years died in 11-20-2022 and that she has been very anxious, nauseas, weak since then. She reports seeing a hospice grief counselor which has been helpful to her.   Review of Systems  Constitutional: Positive for malaise/fatigue. Negative for chills, diaphoresis, fever and weight loss.  Respiratory: Negative.   Cardiovascular: Negative.   Gastrointestinal: Positive for abdominal pain (Pt reports having a lot of abdominal pressure.), constipation and nausea. Negative for blood in stool, diarrhea, heartburn, melena and vomiting.  Genitourinary: Positive for frequency and urgency. Negative for dysuria, flank pain and hematuria.  Neurological: Positive for headaches. Negative for weakness.  Psychiatric/Behavioral: The patient is nervous/anxious.        Objective:   BP 112/60 (BP Location: Left Arm, Patient Position: Sitting, Cuff  Size: Normal)   Pulse 76   Temp 98.2 F (36.8 C) (Oral)   Resp 16   Wt 111 lb (50.3 kg)   BMI 21.68 kg/m   Patient Active Problem List   Diagnosis Date Noted  . History of allergy 08/18/2015  . Osteoporosis 05/07/2015  . Constipation, chronic 05/07/2015  . History of colorectal cancer 05/07/2015  . Depression with anxiety 05/07/2015  . Arthritis 05/07/2015  . Mixed hyperlipidemia 05/07/2015  . GERD with stricture 05/07/2015  . Low back pain 02/10/2015  . Problems influencing health status 10/28/2014  . Arthritis 10/28/2014  . Malignant neoplasm ([place]) 10/28/2014  . History of colonic polyps 10/28/2014  . Chronic constipation 10/28/2014  . Anxiety and depression 10/28/2014  . Acid reflux 10/28/2014  . History of malignant neoplasm of large intestine 10/28/2014  . 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 OP (osteoporosis) 10/28/2014  . Gastric ulcer 10/28/2014  . 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 Awareness of heartbeats 03/25/2014    Outpatient Encounter Prescriptions as of 03/18/2016  Medication Sig Note  . alendronate (FOSAMAX) 70 MG tablet Take 70 mg by mouth once a week. Reported on 08/18/2015   . aspirin 81 MG tablet Take 81 mg by mouth daily.   . Calcium Carb-Cholecalciferol 600-200 MG-UNIT TABS Take 1 tablet by mouth 2 (two) times daily.   . cetirizine (ZYRTEC) 10 MG tablet TAKE ONE TABLET BY MOUTH ONCE DAILY FOR ALLERGIES   . lovastatin (MEVACOR) 40 MG tablet Take 1 tablet (40 mg total) by mouth at bedtime.   . mirtazapine (REMERON) 15 MG tablet Take  15 mg by mouth at bedtime.   . Multiple Vitamins-Minerals (MULTIVITAMIN PO) Take by mouth.   . nitrofurantoin, macrocrystal-monohydrate, (MACROBID) 100 MG capsule Take 1 capsule (100 mg total) by mouth 2 (two) times daily.   . Omega-3 Fatty Acids (FISH OIL PO) Take by mouth.   Marland Kitchen omeprazole (PRILOSEC) 20 MG capsule Take 1 capsule (20 mg total) by mouth 2 (two) times  daily.   . ranitidine (ZANTAC) 150 MG tablet Take 150 mg by mouth daily.   . sertraline (ZOLOFT) 50 MG tablet  03/18/2016: Received from: External Pharmacy  . Vitamin D, Cholecalciferol, 1000 UNITS CAPS Take 1 capsule by mouth daily.   Marland Kitchen zolpidem (AMBIEN) 5 MG tablet Take 5 mg by mouth at bedtime as needed for sleep.   . [DISCONTINUED] clonazePAM (KLONOPIN) 1 MG tablet Take 1 tablet (1 mg total) by mouth at bedtime.   . [DISCONTINUED] DULoxetine (CYMBALTA) 20 MG capsule Take 1 capsule by mouth daily.   . [DISCONTINUED] ondansetron (ZOFRAN ODT) 4 MG disintegrating tablet Take 1 tablet (4 mg total) by mouth every 8 (eight) hours as needed for nausea or vomiting.   . [DISCONTINUED] traZODone (DESYREL) 100 MG tablet Take 100 mg by mouth at bedtime.    No facility-administered encounter medications on file as of 03/18/2016.     Allergies  Allergen Reactions  . Clarithromycin Other (See Comments)    Questionable Biaxin vs Flagyl with chest discomfort & irregular heartbeat (most likely thought to be related to the Biaxin)  . Ibuprofen Other (See Comments)    To prevent swelling,increase hr/bp  . Lipitor  [Atorvastatin] Other (See Comments)    Mayalgia  . Metronidazole Other (See Comments)    Questionable Biaxin vs Flagyl with chest discomfort & irregular heartbeat (most likely thought to be related to the Biaxin)  . Other Other (See Comments)    decongestants  . Penicillins   . Simvastatin Other (See Comments)    Hari Loss  . Sulfa Antibiotics   . Toviaz  [Fesoterodine Fumarate Er] Other (See Comments)    weakness       Physical Exam  Constitutional: She is oriented to person, place, and time. She appears well-developed and well-nourished. No distress.  Cardiovascular: Normal rate, regular rhythm and normal heart sounds.   Pulmonary/Chest: Effort normal and breath sounds normal. No respiratory distress. She has no wheezes. She has no rales.  Abdominal: Soft. Bowel sounds are normal. She  exhibits no distension. There is no tenderness. There is no rigidity, no rebound, no guarding and no CVA tenderness.  Neurological: She is alert and oriented to person, place, and time.  Skin: Skin is warm and dry. She is not diaphoretic.  Psychiatric: She has a normal mood and affect. Her behavior is normal.       Assessment & Plan:   Problem List Items Addressed This Visit    None    Visit Diagnoses    Frequency of micturition    -  Primary   Relevant Orders   Ambulatory referral to Urology   Urinary urgency       Relevant Orders   Ambulatory referral to Urology      1. Ordered in office urinalysis with culture.   Urinalysis    Component Value Date/Time   COLORURINE YELLOW (A) 02/15/2016 1733   APPEARANCEUR CLEAR (A) 02/15/2016 1733   LABSPEC 1.015 02/15/2016 1733   PHURINE 6.0 02/15/2016 1733   GLUCOSEU NEGATIVE 02/15/2016 1733   HGBUR NEGATIVE 02/15/2016 1733  BILIRUBINUR negative 03/08/2016 1110   KETONESUR 2+ (A) 02/15/2016 1733   PROTEINUR negative 03/08/2016 1110   PROTEINUR NEGATIVE 02/15/2016 1733   UROBILINOGEN 0.2 03/08/2016 1110   NITRITE negative 03/08/2016 1110   NITRITE NEGATIVE 02/15/2016 1733   LEUKOCYTESUR Negative 03/08/2016 1110   2. Urinalysis in office today looks normal. Will send for culture to confirm. Instructed patient to complete antibiotic course. Instructed patient that while her symptoms may be due to infectious causes, there may be an element of overactive bladder or other irritative syndromes that warrant a referral to urology. A referral to urology has been placed today and patient has been instructed that a referral coordinator would be contacting her.  3. Patient should report back to clinic or to emergency room should they experience extreme low back pain, fever, nausea, vomiting.  I have spent 15 minutes with this patient, greater than half of which has been spent on counseling and education.  The entirety of the information  documented in the History of Present Illness, Review of Systems and Physical Exam were personally obtained by me. Portions of this information were initially documented by Ashley Royalty, CMA and reviewed by me for thoroughness and accuracy.     Patient Instructions  Urinary Tract Infection Urinary tract infections (UTIs) can develop anywhere along your urinary tract. Your urinary tract is your body's drainage system for removing wastes and extra water. Your urinary tract includes two kidneys, two ureters, a bladder, and a urethra. Your kidneys are a pair of bean-shaped organs. Each kidney is about the size of your fist. They are located below your ribs, one on each side of your spine. CAUSES Infections are caused by microbes, which are microscopic organisms, including fungi, viruses, and bacteria. These organisms are so small that they can only be seen through a microscope. Bacteria are the microbes that most commonly cause UTIs. SYMPTOMS  Symptoms of UTIs may vary by age and gender of the patient and by the location of the infection. Symptoms in young women typically include a frequent and intense urge to urinate and a painful, burning feeling in the bladder or urethra during urination. Older women and men are more likely to be tired, shaky, and weak and have muscle aches and abdominal pain. A fever may mean the infection is in your kidneys. Other symptoms of a kidney infection include pain in your back or sides below the ribs, nausea, and vomiting. DIAGNOSIS To diagnose a UTI, your caregiver will ask you about your symptoms. Your caregiver will also ask you to provide a urine sample. The urine sample will be tested for bacteria and white blood cells. White blood cells are made by your body to help fight infection. TREATMENT  Typically, UTIs can be treated with medication. Because most UTIs are caused by a bacterial infection, they usually can be treated with the use of antibiotics. The choice of  antibiotic and length of treatment depend on your symptoms and the type of bacteria causing your infection. HOME CARE INSTRUCTIONS  If you were prescribed antibiotics, take them exactly as your caregiver instructs you. Finish the medication even if you feel better after you have only taken some of the medication.  Drink enough water and fluids to keep your urine clear or pale yellow.  Avoid caffeine, tea, and carbonated beverages. They tend to irritate your bladder.  Empty your bladder often. Avoid holding urine for long periods of time.  Empty your bladder before and after sexual intercourse.  After a  bowel movement, women should cleanse from front to back. Use each tissue only once. SEEK MEDICAL CARE IF:   You have back pain.  You develop a fever.  Your symptoms do not begin to resolve within 3 days. SEEK IMMEDIATE MEDICAL CARE IF:   You have severe back pain or lower abdominal pain.  You develop chills.  You have nausea or vomiting.  You have continued burning or discomfort with urination. MAKE SURE YOU:   Understand these instructions.  Will watch your condition.  Will get help right away if you are not doing well or get worse.   This information is not intended to replace advice given to you by your health care provider. Make sure you discuss any questions you have with your health care provider.   Document Released: 03/10/2005 Document Revised: 02/19/2015 Document Reviewed: 07/09/2011 Elsevier Interactive Patient Education [employer].

## 2016-03-20 LAB — URINE CULTURE: Organism ID, Bacteria: NO GROWTH

## 2016-03-20 LAB — PLEASE NOTE

## 2016-03-22 ENCOUNTER — Telehealth: Payer: Self-pay

## 2016-03-22 NOTE — Telephone Encounter (Signed)
Pt advised.   Thanks,   -Laura  

## 2016-03-23 ENCOUNTER — Ambulatory Visit: Payer: Self-pay | Admitting: Physician Assistant

## 2016-03-25 DIAGNOSIS — F410 Panic disorder [episodic paroxysmal anxiety] without agoraphobia: Secondary | ICD-10-CM | POA: Diagnosis not present

## 2016-03-25 DIAGNOSIS — F332 Major depressive disorder, recurrent severe without psychotic features: Secondary | ICD-10-CM | POA: Diagnosis not present

## 2016-03-25 DIAGNOSIS — F411 Generalized anxiety disorder: Secondary | ICD-10-CM | POA: Diagnosis not present

## 2016-03-30 ENCOUNTER — Ambulatory Visit: Payer: Self-pay | Admitting: Urology

## 2016-04-07 DIAGNOSIS — Z634 Disappearance and death of family member: Secondary | ICD-10-CM | POA: Diagnosis not present

## 2016-04-07 DIAGNOSIS — F411 Generalized anxiety disorder: Secondary | ICD-10-CM | POA: Diagnosis not present

## 2016-04-07 DIAGNOSIS — F331 Major depressive disorder, recurrent, moderate: Secondary | ICD-10-CM | POA: Diagnosis not present

## 2016-04-14 ENCOUNTER — Other Ambulatory Visit: Payer: Self-pay | Admitting: Obstetrics and Gynecology

## 2016-04-15 DIAGNOSIS — R0981 Nasal congestion: Secondary | ICD-10-CM | POA: Diagnosis not present

## 2016-04-15 DIAGNOSIS — J309 Allergic rhinitis, unspecified: Secondary | ICD-10-CM | POA: Diagnosis not present

## 2016-04-16 ENCOUNTER — Encounter: Payer: Self-pay | Admitting: Urology

## 2016-04-16 ENCOUNTER — Ambulatory Visit (INDEPENDENT_AMBULATORY_CARE_PROVIDER_SITE_OTHER): Payer: Medicare Other | Admitting: Urology

## 2016-04-16 VITALS — BP 122/68 | HR 76 | Ht 60.0 in | Wt 112.3 lb

## 2016-04-16 DIAGNOSIS — R3915 Urgency of urination: Secondary | ICD-10-CM | POA: Diagnosis not present

## 2016-04-16 DIAGNOSIS — N3941 Urge incontinence: Secondary | ICD-10-CM

## 2016-04-16 DIAGNOSIS — N390 Urinary tract infection, site not specified: Secondary | ICD-10-CM | POA: Diagnosis not present

## 2016-04-16 LAB — MICROSCOPIC EXAMINATION
Bacteria, UA: NONE SEEN
WBC, UA: NONE SEEN /hpf (ref 0–?)

## 2016-04-16 LAB — URINALYSIS, COMPLETE
BILIRUBIN UA: NEGATIVE
Glucose, UA: NEGATIVE
Ketones, UA: NEGATIVE
Leukocytes, UA: NEGATIVE
Nitrite, UA: NEGATIVE
PH UA: 7 (ref 5.0–7.5)
Protein, UA: NEGATIVE
RBC UA: NEGATIVE
Specific Gravity, UA: 1.01 (ref 1.005–1.030)
UUROB: 0.2 mg/dL (ref 0.2–1.0)

## 2016-04-16 LAB — BLADDER SCAN AMB NON-IMAGING

## 2016-04-16 NOTE — Patient Instructions (Signed)
Probiotics WHAT ARE PROBIOTICS? Probiotics are the good bacteria and yeasts that live in your body and keep you and your digestive system healthy. Probiotics also help your body's defense (immune) system and protect your body against bad bacterial growth.  Certain foods contain probiotics, such as yogurt. Probiotics can also be purchased as a supplement. As with any supplement or drug, it is important to discuss its use with your health care provider.  WHAT AFFECTS THE BALANCE OF BACTERIA IN MY BODY? The balance of bacteria in your body can be affected by:   Antibiotic medicines. Antibiotics are sometimes necessary to treat infection. Unfortunately, they may kill good or friendly bacteria in your body as well as the bad bacteria. This may lead to stomach problems like diarrhea, gas, and cramping.  Disease. Some conditions are the result of an overgrowth of bad bacteria, yeasts, parasites, or fungi. These conditions include:   Infectious diarrhea.  Stomach and respiratory infections.  Skin infections.  Irritable bowel syndrome (IBS).  Inflammatory bowel diseases.  Ulcer due to Helicobacter pylori (H. pylori) infection.  Tooth decay and periodontal disease.  Vaginal infections. Stress and poor diet may also lower the good bacteria in your body.  WHAT TYPE OF PROBIOTIC IS RIGHT FOR ME? Probiotics are available over the counter at your local pharmacy, health food, or grocery store. They come in many different forms, combinations of strains, and dosing strengths. Some may need to be refrigerated. Always read the label for storage and usage instructions. Specific strains have been shown to be more effective for certain conditions. Ask your health care provider what option is best for you.  WHY WOULD I NEED PROBIOTICS? There are many reasons your health care provider might recommend a probiotic supplement, including:   Diarrhea.  Constipation.  IBS.  Respiratory infections.  Yeast  infections.  Acne, eczema, and other skin conditions.  Frequent urinary tract infections (UTIs). ARE THERE SIDE EFFECTS OF PROBIOTICS? Some people experience mild side effects when taking probiotics. Side effects are usually temporary and may include:   Gas.  Bloating.  Cramping. Rarely, serious side effects, such as infection or immune system changes, may occur. WHAT ELSE DO I NEED TO KNOW ABOUT PROBIOTICS?   There are many different strains of probiotics. Certain strains may be more effective depending on your condition. Probiotics are available in varying doses. Ask your health care provider which probiotic you should use and how often.   If you are taking probiotics along with antibiotics, it is generally recommended to wait at least 2 hours between taking the antibiotic and taking the probiotic.  FOR MORE INFORMATION:  [facility] [email or web address]   This information is not intended to replace advice given to you by your health care provider. Make sure you discuss any questions you have with your health care provider.   Document Released: 12/26/2013 Document Reviewed: 12/26/2013 Elsevier Interactive Patient Education 2016 [place].   Cranberry chew tabs, tablets, or capsules What is this medicine? Cranberry [employer] beri) is a dietary supplement. It is promoted to maintain urinary tract health. The FDA has not approved this supplement for any medical use. This supplement may be used for other purposes; ask your health care provider or pharmacist if you have questions. This medicine may be used for other purposes; ask your health care provider or pharmacist if you have questions. What should I tell my health care provider before I take this medicine? They need to know if you have  any of these conditions: -diabetes -kidney stones -lung or breathing disease, like asthma -stomach or intestine problems -an unusual or  allergic reaction to cranberry, other herbs or plants, aspirin, other medicines, foods, dyes, or preservatives -pregnant or trying to get pregnant -breast-feeding How should I use this medicine? Take this supplement by mouth with a glass of water. Some tablets may be chewed before swallowing. Follow the directions on the package labeling or take as directed by your health care professional. Do not take this supplement more often than directed. Contact your pediatrician or health care professional regarding the use of this supplement in children. Special care may be needed. Overdosage: If you think you have taken too much of this medicine contact a poison control center or emergency room at once. NOTE: This medicine is only for you. Do not share this medicine with others. What if I miss a dose? If you miss a dose, take it as soon as you can. If it is almost time for your next dose, take only that dose. Do not take double or extra doses. What may interact with this medicine? -warfarin This list may not describe all possible interactions. Give your health care provider a list of all the medicines, herbs, non-prescription drugs, or dietary supplements you use. Also tell them if you smoke, drink alcohol, or use illegal drugs. Some items may interact with your medicine. What should I watch for while using this medicine? Herbal or dietary supplements are not regulated like medicines. Rigid quality control standards are not required for dietary supplements. The purity and strength of these products can vary. The safety and effect of this dietary supplement for a certain disease or illness is not well known. This product is not intended to diagnose, treat, cure or prevent any disease. The Food and Drug Administration suggests the following to help consumers protect themselves: -Always read product labels and follow directions. -Natural does not mean a product is safe for humans to take. -Look for products  that include USP after the ingredient name. This means that the manufacturer followed the standards of the [place] Pharmacopoeia. -Supplements made or sold by a nationally known food or drug company are more likely to be made under tight controls. You can write to the company for more information about how the product was made. What side effects may I notice from receiving this medicine? Side effects that you should report to your doctor or health care professional as soon as possible: -allergic reactions like skin rash, itching or hives, swelling of the face, lips, or tongue -blood in the urine -pain in the lower back or side -pain when urinating Side effects that usually do not require medical attention (Report these to your doctor or health care professional if they continue or are bothersome.): -changes in taste -mild stomach upset This list may not describe all possible side effects. Call your doctor for medical advice about side effects. You may report side effects to FDA at 1-800-FDA-1088. Where should I keep my medicine? Keep out of the reach of children. Store as directed on the package label. Throw away any unused supplement after the expiration date. NOTE: This sheet is a summary. It may not cover all possible information. If you have questions about this medicine, talk to your doctor, pharmacist, or health care provider.    2016, Elsevier/Gold Standard. (2013-09-25 08:47:22)

## 2016-04-16 NOTE — Progress Notes (Signed)
04/16/2016 10:11 AM   Emika Patsi Sears [birth year] [id number]  Referring provider: Margo Common, [address] [place], [place] [address]  Chief Complaint  Patient presents with  . Urinary Frequency    New patient    HPI: 75 yo F referred by Vernie Murders for "bladder issues."    She reports that she was treated for a UTI (associated bladder pressure/ dysuria)  on Labor day and was seen in the ED.  urine culture at that time grew mixed flora. She was treated with antibiotics but her symptoms fail to resolve.  She was seen again on 02/26/2016 by her PCP and again grew mixed flora.  Finally, she was seen on one last occasion on 03/08/2016 and finally grew Escherichia coli. She was treated with Macrobid this time and her symptoms completely resolved.  Her to these infections, she had rare urinary tract infections. She thinks her infection may be precipitated by colonoscopy prep which happened just prior to these infections.    Today, she reports that her dysuria has resolved. She very rarely has lower abdominal pressure and is not bothered by this. She does have some low back pain which is chronic.  At night, she sleeps in [place].  he gets up 2-3 times during to void.  When her feet hit the floor, she has to void and often can't get there on time.  This has been going on for a while.    No day time issues.  No day time urgency, frequency, or incontinence unless she drinks lots of water.    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    No history of kidney stones.   PMH: Past Medical History:  Diagnosis Date  . Anxiety   . Arthritis   . Asthma   . Cancer ([place])   . Depression   . Dyspepsia   . Elevated cholesterol   . GERD (gastroesophageal reflux disease)   . History of hiatal hernia   . Peptic ulcer disease     Surgical History: Past Surgical History:  Procedure Laterality Date  . BREAST BIOPSY Left 2000   benign  . CARDIAC CATHETERIZATION  2015   WNL  . CATARACT EXTRACTION Bilateral 2013  . CATARACT EXTRACTION, BILATERAL Bilateral 2013   Dr. Willy Eddy  . COLONOSCOPY WITH PROPOFOL N/A 02/04/2016   Procedure: COLONOSCOPY WITH PROPOFOL;  Surgeon: Manya Silvas, MD;  Location: [facility] ENDOSCOPY;  Service: Endoscopy;  Laterality: N/A;  . ESOPHAGOGASTRODUODENOSCOPY (EGD) WITH PROPOFOL N/A 02/04/2016   Procedure: ESOPHAGOGASTRODUODENOSCOPY (EGD) WITH PROPOFOL;  Surgeon: Manya Silvas, MD;  Location: [facility] ENDOSCOPY;  Service: Endoscopy;  Laterality: N/A;  . resection of colorectal cancer without sequela  1999  . resection of colorectal cancer without sequela    . TUBAL LIGATION  1976    Home Medications:    Medication List       Accurate as of 04/16/16 10:11 AM. Always use your most recent med list.          alendronate 70 MG tablet Commonly known as:  FOSAMAX Take 70 mg by mouth once a week. Reported on 08/18/2015   aspirin 81 MG tablet Take 81 mg by mouth daily.   Calcium Carb-Cholecalciferol 600-200 MG-UNIT Tabs Take 1 tablet by mouth 2 (two) times daily.   cetirizine 10 MG tablet Commonly known as:  ZYRTEC TAKE ONE TABLET BY MOUTH ONCE DAILY FOR ALLERGIES   FISH OIL PO Take by mouth.   lovastatin 40 MG  tablet Commonly known as:  MEVACOR Take 1 tablet (40 mg total) by mouth at bedtime.   mirtazapine 15 MG tablet Commonly known as:  REMERON Take 15 mg by mouth at bedtime.   MULTIVITAMIN PO Take by mouth.   omeprazole 20 MG capsule Commonly known as:  PRILOSEC Take 1 capsule (20 mg total) by mouth 2 (two) times daily.   sertraline 50 MG tablet Commonly known as:  ZOLOFT   Vitamin D (Cholecalciferol) 1000 units Caps Take 1 capsule by mouth daily.   zolpidem 5 MG tablet Commonly known as:  AMBIEN Take 5 mg by mouth at bedtime as needed for sleep.       Allergies:  Allergies  Allergen Reactions  . Clarithromycin Other (See Comments)    Questionable Biaxin vs Flagyl with chest discomfort & irregular  heartbeat (most likely thought to be related to the Biaxin)  . Lipitor  [Atorvastatin] Other (See Comments)    Mayalgia  . Metronidazole Other (See Comments)    Questionable Biaxin vs Flagyl with chest discomfort & irregular heartbeat (most likely thought to be related to the Biaxin)  . Other Other (See Comments)    decongestants  . Penicillins   . Simvastatin Other (See Comments)    Hari Loss  . Sulfa Antibiotics   . Toviaz  [Fesoterodine Fumarate Er] Other (See Comments)    weakness    Family History: Family History  Problem Relation Age of Onset  . Cirrhosis Sister   . Colon polyps Sister   . Leukemia Mother   . Depression Mother   . Heart disease Father   . Depression Father   . Bipolar disorder Daughter   . Prostate cancer Neg Hx   . Kidney cancer Neg Hx     Social History:  reports that she has never smoked. She has never used smokeless tobacco. She reports that she does not drink alcohol or use drugs.  ROS: UROLOGY Frequent Urination?: Yes Hard to postpone urination?: Yes Burning/pain with urination?: No Get up at night to urinate?: Yes Leakage of urine?: Yes Urine stream starts and stops?: Yes Trouble starting stream?: No Do you have to strain to urinate?: Yes Blood in urine?: No Urinary tract infection?: No Sexually transmitted disease?: No Injury to kidneys or bladder?: No Painful intercourse?: No Weak stream?: No Currently pregnant?: No Vaginal bleeding?: No Last menstrual period?: n  Gastrointestinal Nausea?: Yes Vomiting?: No Indigestion/heartburn?: Yes Diarrhea?: No Constipation?: Yes  Constitutional Fever: No Night sweats?: No Weight loss?: Yes Fatigue?: Yes  Skin Skin rash/lesions?: No Itching?: No  Eyes Blurred vision?: No Double vision?: No  Ears/Nose/Throat Sore throat?: No Sinus problems?: Yes  Hematologic/Lymphatic Swollen glands?: No Easy bruising?: Yes  Cardiovascular Leg swelling?: No Chest pain?:  No  Respiratory Cough?: Yes Shortness of breath?: No  Endocrine Excessive thirst?: No  Musculoskeletal Back pain?: Yes Joint pain?: Yes  Neurological Headaches?: No Dizziness?: No  Psychologic Depression?: Yes Anxiety?: Yes  Physical Exam: BP 122/68   Pulse 76   Ht 5' (1.524 m)   Wt 112 lb 4.8 oz (50.9 kg)   BMI 21.93 kg/m   Constitutional:  Alert and oriented, No acute distress. HEENT: [place] AT, moist mucus membranes.  Trachea midline, no masses. Cardiovascular: No clubbing, cyanosis, or edema. Respiratory: Normal respiratory effort, no increased work of breathing. GI: Abdomen is soft, nontender, nondistended, no abdominal masses GU: No CVA tenderness. No suprapubic tenderness. Skin: No rashes, bruises or suspicious lesions. Neurologic: Grossly intact, no focal deficits, moving  all 4 extremities. Psychiatric: Normal mood and affect.  Laboratory Data: Lab Results  Component Value Date   WBC 6.9 02/15/2016   HGB 15.5 02/15/2016   HCT 44.2 02/15/2016   MCV 92.3 02/15/2016   PLT 169 02/15/2016    Lab Results  Component Value Date   CREATININE 0.84 02/15/2016   Urinalysis  UA today reviewed, completely negative. See Epic.  Pertinent Imaging: Pertinent renal imaging  Assessment & Plan:   1. Recurrent UTI I suspected and adequately treated infection rather than 3 separate infections over the course of a month. Ultimately, her urine culture did grow Escherichia coli which was resistant to Cipro which she been previously prescribed. After receiving appropriate antibiotics, her symptoms have resolved.  We did discuss general hygiene issues for prevention of UTIs. In addition, I recommended probiotics cranberry tablets which may help prevent future infections. All her questions were answered.  2. Urinary urgency/ urine incontinence She does have some urinary urgency/urge incontinence but is particularly bothered by this. I did offer a trial of an anticholinergic  which she declined today. She will let us know if she becomes more symptomatic or bothered by her symptoms at which time intervention could be pursued.  Adequate bladder emptying today on post void residual, no concern for overflow incontinence.  - Urinalysis, Complete - BLADDER SCAN AMB NON-IMAGING  Return if symptoms worsen or fail to improve.  Hollice Espy, MD  [facility] [address], [place], [place] [address] [phone]

## 2016-04-20 DIAGNOSIS — J301 Allergic rhinitis due to pollen: Secondary | ICD-10-CM | POA: Diagnosis not present

## 2016-04-21 DIAGNOSIS — F331 Major depressive disorder, recurrent, moderate: Secondary | ICD-10-CM | POA: Diagnosis not present

## 2016-04-21 DIAGNOSIS — Z634 Disappearance and death of family member: Secondary | ICD-10-CM | POA: Diagnosis not present

## 2016-04-21 DIAGNOSIS — F411 Generalized anxiety disorder: Secondary | ICD-10-CM | POA: Diagnosis not present

## 2016-04-26 DIAGNOSIS — G4700 Insomnia, unspecified: Secondary | ICD-10-CM | POA: Diagnosis not present

## 2016-04-26 DIAGNOSIS — F410 Panic disorder [episodic paroxysmal anxiety] without agoraphobia: Secondary | ICD-10-CM | POA: Diagnosis not present

## 2016-04-26 DIAGNOSIS — F411 Generalized anxiety disorder: Secondary | ICD-10-CM | POA: Diagnosis not present

## 2016-04-26 DIAGNOSIS — F332 Major depressive disorder, recurrent severe without psychotic features: Secondary | ICD-10-CM | POA: Diagnosis not present

## 2016-04-29 DIAGNOSIS — Z8679 Personal history of other diseases of the circulatory system: Secondary | ICD-10-CM | POA: Diagnosis not present

## 2016-04-29 DIAGNOSIS — R0789 Other chest pain: Secondary | ICD-10-CM | POA: Diagnosis not present

## 2016-04-29 DIAGNOSIS — E782 Mixed hyperlipidemia: Secondary | ICD-10-CM | POA: Diagnosis not present

## 2016-04-29 DIAGNOSIS — K210 Gastro-esophageal reflux disease with esophagitis: Secondary | ICD-10-CM | POA: Diagnosis not present

## 2016-05-10 DIAGNOSIS — R0982 Postnasal drip: Secondary | ICD-10-CM | POA: Diagnosis not present

## 2016-05-10 DIAGNOSIS — R51 Headache: Secondary | ICD-10-CM | POA: Diagnosis not present

## 2016-05-11 ENCOUNTER — Other Ambulatory Visit: Payer: Self-pay | Admitting: Family Medicine

## 2016-05-11 ENCOUNTER — Telehealth: Payer: Self-pay | Admitting: Family Medicine

## 2016-05-11 DIAGNOSIS — K219 Gastro-esophageal reflux disease without esophagitis: Secondary | ICD-10-CM

## 2016-05-11 DIAGNOSIS — K222 Esophageal obstruction: Principal | ICD-10-CM

## 2016-05-11 MED ORDER — OMEPRAZOLE 20 MG PO CPDR: 20.0000 mg | DELAYED_RELEASE_CAPSULE | Freq: Two times a day (BID) | ORAL | 3 refills | Status: DC

## 2016-05-11 NOTE — Telephone Encounter (Signed)
Pt wants to know if you Ann Young) will call in a rx of nexium 20 mg 2x day.  For 90 days supply.  She has been buying it OTC and it is expensive  She uses [facility].   Thank sTeri

## 2016-05-11 NOTE — Telephone Encounter (Signed)
Patient advised. Patient states she was previously taking Omeprazole 20 mg and it was not helping any more so Dr. Vira Agar changed to Nexium 20 mg . Patient is requesting Nexium 20 mg BID be sent to [facility].  Patient scheduled follow up appointment.

## 2016-05-11 NOTE — Telephone Encounter (Signed)
Last note by Dr. Vira Agar (gastroenterologist) shows he had you on the generic Omeprazole 20 mg BID. This is probably what insurance would pay best on. Will send this to the [facility]. Should schedule recheck in the next 3-6 weeks to be sure this condition is stable.

## 2016-05-13 ENCOUNTER — Other Ambulatory Visit: Payer: Self-pay | Admitting: Family Medicine

## 2016-05-13 DIAGNOSIS — K219 Gastro-esophageal reflux disease without esophagitis: Secondary | ICD-10-CM

## 2016-05-13 DIAGNOSIS — K222 Esophageal obstruction: Principal | ICD-10-CM

## 2016-05-13 MED ORDER — ESOMEPRAZOLE MAGNESIUM 20 MG PO PACK: PACK | ORAL | 3 refills | Status: DC

## 2016-05-13 NOTE — Telephone Encounter (Signed)
Left patient a voicemail advising her that RX has been sent to pharmacy to to keep follow up appt that's scheduled.

## 2016-05-13 NOTE — Telephone Encounter (Signed)
Prescription sent to pharmacy. Keep appointment as scheduled to assess progress.

## 2016-05-17 ENCOUNTER — Telehealth: Payer: Self-pay

## 2016-05-17 NOTE — Telephone Encounter (Signed)
PA denied for Nexium 20 mg.

## 2016-05-17 NOTE — Telephone Encounter (Signed)
Pt returned your call ° °teri °

## 2016-05-17 NOTE — Telephone Encounter (Signed)
Insurance denied coverage for Nexium. Probably have to go back to the Omeprazole 20 mg BID or get gastroenterologist to try to get it for her.

## 2016-05-17 NOTE — Telephone Encounter (Signed)
LMTCB

## 2016-05-18 DIAGNOSIS — H1013 Acute atopic conjunctivitis, bilateral: Secondary | ICD-10-CM | POA: Diagnosis not present

## 2016-05-18 NOTE — Telephone Encounter (Signed)
Patient advised. She states Omeprazole did not help symptoms so she does not want to take it again. Patient will contact GI to see if they can get Nexium approved.

## 2016-05-20 DIAGNOSIS — F410 Panic disorder [episodic paroxysmal anxiety] without agoraphobia: Secondary | ICD-10-CM | POA: Diagnosis not present

## 2016-05-20 DIAGNOSIS — G4700 Insomnia, unspecified: Secondary | ICD-10-CM | POA: Diagnosis not present

## 2016-05-20 DIAGNOSIS — F332 Major depressive disorder, recurrent severe without psychotic features: Secondary | ICD-10-CM | POA: Diagnosis not present

## 2016-05-20 DIAGNOSIS — F411 Generalized anxiety disorder: Secondary | ICD-10-CM | POA: Diagnosis not present

## 2016-05-25 ENCOUNTER — Other Ambulatory Visit: Payer: Self-pay | Admitting: Obstetrics and Gynecology

## 2016-05-25 DIAGNOSIS — Z01419 Encounter for gynecological examination (general) (routine) without abnormal findings: Secondary | ICD-10-CM | POA: Diagnosis not present

## 2016-05-25 DIAGNOSIS — Z1231 Encounter for screening mammogram for malignant neoplasm of breast: Secondary | ICD-10-CM

## 2016-05-25 DIAGNOSIS — N904 Leukoplakia of vulva: Secondary | ICD-10-CM | POA: Diagnosis not present

## 2016-05-25 DIAGNOSIS — Z1211 Encounter for screening for malignant neoplasm of colon: Secondary | ICD-10-CM | POA: Diagnosis not present

## 2016-05-25 DIAGNOSIS — M810 Age-related osteoporosis without current pathological fracture: Secondary | ICD-10-CM | POA: Diagnosis not present

## 2016-05-25 DIAGNOSIS — Z124 Encounter for screening for malignant neoplasm of cervix: Secondary | ICD-10-CM | POA: Diagnosis not present

## 2016-05-25 DIAGNOSIS — R1032 Left lower quadrant pain: Secondary | ICD-10-CM | POA: Diagnosis not present

## 2016-05-27 DIAGNOSIS — F411 Generalized anxiety disorder: Secondary | ICD-10-CM | POA: Diagnosis not present

## 2016-05-27 DIAGNOSIS — Z634 Disappearance and death of family member: Secondary | ICD-10-CM | POA: Diagnosis not present

## 2016-05-27 DIAGNOSIS — F331 Major depressive disorder, recurrent, moderate: Secondary | ICD-10-CM | POA: Diagnosis not present

## 2016-06-02 DIAGNOSIS — M810 Age-related osteoporosis without current pathological fracture: Secondary | ICD-10-CM | POA: Diagnosis not present

## 2016-06-03 ENCOUNTER — Ambulatory Visit (INDEPENDENT_AMBULATORY_CARE_PROVIDER_SITE_OTHER): Payer: Medicare Other | Admitting: Family Medicine

## 2016-06-03 ENCOUNTER — Encounter: Payer: Self-pay | Admitting: Family Medicine

## 2016-06-03 VITALS — BP 122/60 | HR 72 | Temp 98.4°F | Resp 16 | Wt 117.0 lb

## 2016-06-03 DIAGNOSIS — M549 Dorsalgia, unspecified: Secondary | ICD-10-CM | POA: Diagnosis not present

## 2016-06-03 DIAGNOSIS — K2100 Gastro-esophageal reflux disease with esophagitis, without bleeding: Secondary | ICD-10-CM

## 2016-06-03 DIAGNOSIS — E559 Vitamin D deficiency, unspecified: Secondary | ICD-10-CM | POA: Diagnosis not present

## 2016-06-03 DIAGNOSIS — M810 Age-related osteoporosis without current pathological fracture: Secondary | ICD-10-CM

## 2016-06-03 LAB — POCT URINALYSIS DIPSTICK
Bilirubin, UA: NEGATIVE
Blood, UA: NEGATIVE
Glucose, UA: NEGATIVE
Ketones, UA: NEGATIVE
LEUKOCYTES UA: NEGATIVE
NITRITE UA: NEGATIVE
PH UA: 7.5
PROTEIN UA: NEGATIVE
Spec Grav, UA: <=1.005
Urobilinogen, UA: 0.2

## 2016-06-03 NOTE — Progress Notes (Signed)
Patient: Ann Young Female    DOB: Dec 27, 1940   75 y.o.   MRN: [id number] Visit Date: 06/03/2016  Today's Provider: Vernie Murders, PA   Chief Complaint  Patient presents with  . Gastroesophageal Reflux  . Back Pain   Subjective:    HPI  Patient comes in today for a follow up on GERD. Patient reports that she has been taking Nexium 20mg  daily. However, she feels like it is not helping her symptoms. Patient reports that she still has some discomfort in her esophagus, but she doesn't know if it could be coming from sinus drainage or not.  Patient also mentions that she has had low back pain, mainly on her left side. She reports that she has had pain for about 1 week. She reports she does have some difficulty urinating.   Past Medical History:  Diagnosis Date  . Anxiety   . Arthritis   . Asthma   . Cancer ([place])   . Depression   . Dyspepsia   . Elevated cholesterol   . GERD (gastroesophageal reflux disease)   . History of hiatal hernia   . Peptic ulcer disease    Past Surgical History:  Procedure Laterality Date  . BREAST BIOPSY Left 2000   benign  . CARDIAC CATHETERIZATION  2015   WNL  . CATARACT EXTRACTION Bilateral 2013  . CATARACT EXTRACTION, BILATERAL Bilateral 2013   Dr. Willy Eddy  . COLONOSCOPY WITH PROPOFOL N/A 02/04/2016   Procedure: COLONOSCOPY WITH PROPOFOL;  Surgeon: Manya Silvas, MD;  Location: [facility] ENDOSCOPY;  Service: Endoscopy;  Laterality: N/A;  . ESOPHAGOGASTRODUODENOSCOPY (EGD) WITH PROPOFOL N/A 02/04/2016   Procedure: ESOPHAGOGASTRODUODENOSCOPY (EGD) WITH PROPOFOL;  Surgeon: Manya Silvas, MD;  Location[facility] ENDOSCOPY;  Service: Endoscopy;  Laterality: N/A;  . resection of colorectal cancer without sequela  1999  . resection of colorectal cancer without sequela    . TUBAL LIGATION  1976   Family History  Problem Relation Age of Onset  . Cirrhosis Sister   . Colon polyps Sister   . Leukemia Mother   . Depression Mother   .  Heart disease Father   . Depression Father   . Bipolar disorder Daughter   . Prostate cancer Neg Hx   . Kidney cancer Neg Hx      Allergies  Allergen Reactions  . Clarithromycin Other (See Comments)    Questionable Biaxin vs Flagyl with chest discomfort & irregular heartbeat (most likely thought to be related to the Biaxin)  . Lipitor  [Atorvastatin] Other (See Comments)    Mayalgia  . Metronidazole Other (See Comments)    Questionable Biaxin vs Flagyl with chest discomfort & irregular heartbeat (most likely thought to be related to the Biaxin)  . Other Other (See Comments)    decongestants  . Penicillins   . Simvastatin Other (See Comments)    Hari Loss  . Sulfa Antibiotics   . Toviaz  [Fesoterodine Fumarate Er] Other (See Comments)    weakness    Current Outpatient Prescriptions:  .  alendronate (FOSAMAX) 70 MG tablet, Take 70 mg by mouth once a week. Reported on 08/18/2015, Disp: , Rfl:  .  aspirin 81 MG tablet, Take 81 mg by mouth daily., Disp: , Rfl:  .  Calcium Carb-Cholecalciferol 600-200 MG-UNIT TABS, Take 1 tablet by mouth 2 (two) times daily., Disp: , Rfl:  .  cetirizine (ZYRTEC) 10 MG tablet, TAKE ONE TABLET BY MOUTH ONCE DAILY FOR ALLERGIES, Disp: 90  tablet, Rfl: 0 .  esomeprazole (NEXIUM) 20 MG packet, Take one tablet by mouth twice a day, Disp: 60 each, Rfl: 3 .  lovastatin (MEVACOR) 40 MG tablet, Take 1 tablet (40 mg total) by mouth at bedtime., Disp: 90 tablet, Rfl: 3 .  mirtazapine (REMERON) 15 MG tablet, Take 15 mg by mouth at bedtime., Disp: , Rfl:  .  Multiple Vitamins-Minerals (MULTIVITAMIN PO), Take by mouth., Disp: , Rfl:  .  Omega-3 Fatty Acids (FISH OIL PO), Take by mouth., Disp: , Rfl:  .  sertraline (ZOLOFT) 50 MG tablet, , Disp: , Rfl:  .  Vitamin D, Cholecalciferol, 1000 UNITS CAPS, Take 1 capsule by mouth daily., Disp: , Rfl:  .  zolpidem (AMBIEN) 5 MG tablet, Take 5 mg by mouth at bedtime as needed for sleep., Disp: , Rfl:   Review of Systems    Constitutional: Positive for activity change and fatigue.  HENT: Positive for congestion and postnasal drip.   Musculoskeletal: Positive for back pain.    Social History  Substance Use Topics  . Smoking status: Never Smoker  . Smokeless tobacco: Never Used  . Alcohol use No   Objective:   BP 122/60 (BP Location: Left Arm, Patient Position: Sitting, Cuff Size: Normal)   Pulse 72   Temp 98.4 F (36.9 C)   Resp 16   Wt 117 lb (53.1 kg)   BMI 22.85 kg/m   Physical Exam  Constitutional: She is oriented to person, place, and time. She appears well-developed and well-nourished. No distress.  HENT:  Head: Normocephalic and atraumatic.  Right Ear: Hearing and external ear normal.  Left Ear: Hearing and external ear normal.  Nose: Nose normal.  Mouth/Throat: Oropharynx is clear and moist.  Eyes: Conjunctivae and lids are normal. Right eye exhibits no discharge. Left eye exhibits no discharge. No scleral icterus.  Neck: Neck supple.  Cardiovascular: Normal rate and regular rhythm.   Pulmonary/Chest: Effort normal and breath sounds normal. No respiratory distress.  Abdominal: Soft. Bowel sounds are normal.  Soreness LUQ of abdomen. No masses, rebound or rigidity. BS wnl.  Musculoskeletal: Normal range of motion.  Lymphadenopathy:    She has no cervical adenopathy.  Neurological: She is alert and oriented to person, place, and time.  Skin: Skin is intact. No lesion and no rash noted.  Psychiatric: She has a normal mood and affect. Her speech is normal and behavior is normal. Thought content normal.      Assessment & Plan:     1. Gastroesophageal reflux disease with esophagitis Still has indigestion/heart burn frequently. Some right back ache and worried about a recurrence of UTI. Urinalysis normal today and no dysuria. Last EGD by Dr. Vira Agar was accomplished 02-04-16 with findings of esophagitis and gastritis. Still taking the Zantac at bedtime with Nexium 20 mg BID. Recommend she  continue the probiotic supplement as advised by Dr. Vira Agar and get follow up labs. May need follow up with gastroenterologist pending reports. - CBC with Differential/Platelet - Comprehensive metabolic panel  2. Osteoporosis, unspecified osteoporosis type, unspecified pathological fracture presence Still taking Fosamax once a week with Calcium/Vitamin-D supplement BID. Had BMD test done yesterday per GYN (Dr. Ouida Sills). Awaiting final report.  3. Left-sided back pain, unspecified back location, unspecified chronicity Some strain recurrence with activities. No known injury. No CVA tenderness to percussion and no dysuria. Will check labs. May apply moist heat and use Aspercreme with Lidocaine. - CBC with Differential/Platelet - POCT urinalysis dipstick  4. Vitamin D deficiency Still  taking vitamin-D 1000 IU qd with Calcium/Vitamin D supplement for osteoporosis.       Vernie Murders, PA  [facility]

## 2016-06-04 LAB — COMPREHENSIVE METABOLIC PANEL
ALBUMIN: 4.6 g/dL (ref 3.5–4.8)
ALK PHOS: 48 IU/L (ref 39–117)
ALT: 31 IU/L (ref 0–32)
AST: 28 IU/L (ref 0–40)
Albumin/Globulin Ratio: 2.2 (ref 1.2–2.2)
BUN / CREAT RATIO: 24 (ref 12–28)
BUN: 19 mg/dL (ref 8–27)
Bilirubin Total: 0.3 mg/dL (ref 0.0–1.2)
CALCIUM: 9.2 mg/dL (ref 8.7–10.3)
CO2: 29 mmol/L (ref 18–29)
CREATININE: 0.78 mg/dL (ref 0.57–1.00)
Chloride: 101 mmol/L (ref 96–106)
GFR calc Af Amer: 87 mL/min/{1.73_m2} (ref >59)
GFR calc non Af Amer: 76 mL/min/{1.73_m2} (ref >59)
GLUCOSE: 95 mg/dL (ref 65–99)
Globulin, Total: 2.1 g/dL (ref 1.5–4.5)
Potassium: 4.1 mmol/L (ref 3.5–5.2)
Sodium: 145 mmol/L — ABNORMAL HIGH (ref 134–144)
Total Protein: 6.7 g/dL (ref 6.0–8.5)

## 2016-06-04 LAB — CBC WITH DIFFERENTIAL/PLATELET
BASOS ABS: 0 10*3/uL (ref 0.0–0.2)
Basos: 1 %
EOS (ABSOLUTE): 0.1 10*3/uL (ref 0.0–0.4)
Eos: 2 %
HEMOGLOBIN: 12.3 g/dL (ref 11.1–15.9)
Hematocrit: 36.6 % (ref 34.0–46.6)
IMMATURE GRANULOCYTES: 0 %
Immature Grans (Abs): 0 10*3/uL (ref 0.0–0.1)
LYMPHS ABS: 1.7 10*3/uL (ref 0.7–3.1)
LYMPHS: 31 %
MCH: 32.1 pg (ref 26.6–33.0)
MCHC: 33.6 g/dL (ref 31.5–35.7)
MCV: 96 fL (ref 79–97)
MONOCYTES: 6 %
Monocytes Absolute: 0.4 10*3/uL (ref 0.1–0.9)
NEUTROS PCT: 60 %
Neutrophils Absolute: 3.3 10*3/uL (ref 1.4–7.0)
Platelets: 169 10*3/uL (ref 150–379)
RBC: 3.83 x10E6/uL (ref 3.77–5.28)
RDW: 12.4 % (ref 12.3–15.4)
WBC: 5.5 10*3/uL (ref 3.4–10.8)

## 2016-06-23 DIAGNOSIS — F411 Generalized anxiety disorder: Secondary | ICD-10-CM | POA: Diagnosis not present

## 2016-06-23 DIAGNOSIS — F331 Major depressive disorder, recurrent, moderate: Secondary | ICD-10-CM | POA: Diagnosis not present

## 2016-06-23 DIAGNOSIS — Z634 Disappearance and death of family member: Secondary | ICD-10-CM | POA: Diagnosis not present

## 2016-07-01 ENCOUNTER — Ambulatory Visit: Payer: Medicare Other

## 2016-07-14 DIAGNOSIS — N882 Stricture and stenosis of cervix uteri: Secondary | ICD-10-CM | POA: Diagnosis not present

## 2016-07-14 DIAGNOSIS — R938 Abnormal findings on diagnostic imaging of other specified body structures: Secondary | ICD-10-CM | POA: Diagnosis not present

## 2016-07-14 DIAGNOSIS — G8929 Other chronic pain: Secondary | ICD-10-CM | POA: Diagnosis not present

## 2016-07-14 DIAGNOSIS — M810 Age-related osteoporosis without current pathological fracture: Secondary | ICD-10-CM | POA: Diagnosis not present

## 2016-07-14 DIAGNOSIS — N904 Leukoplakia of vulva: Secondary | ICD-10-CM | POA: Diagnosis not present

## 2016-07-14 DIAGNOSIS — R102 Pelvic and perineal pain: Secondary | ICD-10-CM | POA: Diagnosis not present

## 2016-07-14 DIAGNOSIS — R1032 Left lower quadrant pain: Secondary | ICD-10-CM | POA: Diagnosis not present

## 2016-07-16 DIAGNOSIS — F410 Panic disorder [episodic paroxysmal anxiety] without agoraphobia: Secondary | ICD-10-CM | POA: Diagnosis not present

## 2016-07-16 DIAGNOSIS — F332 Major depressive disorder, recurrent severe without psychotic features: Secondary | ICD-10-CM | POA: Diagnosis not present

## 2016-07-16 DIAGNOSIS — F411 Generalized anxiety disorder: Secondary | ICD-10-CM | POA: Diagnosis not present

## 2016-07-16 DIAGNOSIS — G4700 Insomnia, unspecified: Secondary | ICD-10-CM | POA: Diagnosis not present

## 2016-07-20 ENCOUNTER — Encounter: Payer: Self-pay | Admitting: Family Medicine

## 2016-07-20 ENCOUNTER — Ambulatory Visit
Admission: RE | Admit: 2016-07-20 | Discharge: 2016-07-20 | Disposition: A | Discharge status: Home / Self Care | Payer: Medicare Other | Source: Ambulatory Visit | Attending: Family Medicine | Admitting: Family Medicine

## 2016-07-20 ENCOUNTER — Other Ambulatory Visit: Payer: Self-pay | Admitting: Obstetrics and Gynecology

## 2016-07-20 ENCOUNTER — Ambulatory Visit
Admission: RE | Admit: 2016-07-20 | Discharge: 2016-07-20 | Disposition: A | Discharge status: Home / Self Care | Payer: Medicare Other | Source: Ambulatory Visit | Attending: Obstetrics and Gynecology | Admitting: Obstetrics and Gynecology

## 2016-07-20 ENCOUNTER — Ambulatory Visit (INDEPENDENT_AMBULATORY_CARE_PROVIDER_SITE_OTHER): Payer: Medicare Other | Admitting: Family Medicine

## 2016-07-20 VITALS — BP 104/58 | HR 73 | Temp 97.8°F | Resp 16 | Wt 121.4 lb

## 2016-07-20 DIAGNOSIS — K317 Polyp of stomach and duodenum: Secondary | ICD-10-CM

## 2016-07-20 DIAGNOSIS — M545 Low back pain, unspecified: Secondary | ICD-10-CM

## 2016-07-20 DIAGNOSIS — M47896 Other spondylosis, lumbar region: Secondary | ICD-10-CM | POA: Diagnosis not present

## 2016-07-20 DIAGNOSIS — Z1231 Encounter for screening mammogram for malignant neoplasm of breast: Secondary | ICD-10-CM | POA: Insufficient documentation

## 2016-07-20 DIAGNOSIS — M810 Age-related osteoporosis without current pathological fracture: Secondary | ICD-10-CM

## 2016-07-20 DIAGNOSIS — K2100 Gastro-esophageal reflux disease with esophagitis, without bleeding: Secondary | ICD-10-CM | POA: Insufficient documentation

## 2016-07-20 DIAGNOSIS — M5417 Radiculopathy, lumbosacral region: Secondary | ICD-10-CM | POA: Diagnosis present

## 2016-07-20 LAB — POCT URINALYSIS DIPSTICK
BILIRUBIN UA: NEGATIVE
Blood, UA: NEGATIVE
Glucose, UA: NEGATIVE
Ketones, UA: NEGATIVE
LEUKOCYTES UA: NEGATIVE
NITRITE UA: NEGATIVE
PH UA: 6
Protein, UA: NEGATIVE
Spec Grav, UA: 1.02
Urobilinogen, UA: 0.2

## 2016-07-20 MED ORDER — ALENDRONATE SODIUM 70 MG PO TABS: 70.0000 mg | ORAL_TABLET | ORAL | 3 refills | Status: DC

## 2016-07-20 MED ORDER — CYCLOBENZAPRINE HCL 5 MG PO TABS: 5.0000 mg | ORAL_TABLET | Freq: Three times a day (TID) | ORAL | 1 refills | Status: DC | PRN

## 2016-07-20 NOTE — Progress Notes (Signed)
Patient: Ann Young Female    DOB: 03/12/1941   76 y.o.   MRN: [id number] Visit Date: 07/20/2016  Today's Provider: Vernie Murders, PA   Chief Complaint  Patient presents with  . Back Pain   Subjective:    Back Pain  This is a new problem. The current episode started 1 to 4 weeks ago. The problem occurs constantly. The problem has been gradually worsening since onset. The pain is present in the lumbar spine. Radiates to: left side of abdomen. Associated symptoms include pelvic pain. (Urinate frequency ) She has tried nothing for the symptoms.   Past Medical History:  Diagnosis Date  . Anxiety   . Arthritis   . Asthma   . Cancer ([place])   . Depression   . Dyspepsia   . Elevated cholesterol   . GERD (gastroesophageal reflux disease)   . History of hiatal hernia   . Peptic ulcer disease    Past Surgical History:  Procedure Laterality Date  . BREAST BIOPSY Left 2000   benign  . CARDIAC CATHETERIZATION  2015   WNL  . CATARACT EXTRACTION Bilateral 2013  . CATARACT EXTRACTION, BILATERAL Bilateral 2013   Dr. Willy Eddy  . COLONOSCOPY WITH PROPOFOL N/A 02/04/2016   Procedure: COLONOSCOPY WITH PROPOFOL;  Surgeon: Manya Silvas, MD;  Location: [facility] ENDOSCOPY;  Service: Endoscopy;  Laterality: N/A;  . ESOPHAGOGASTRODUODENOSCOPY (EGD) WITH PROPOFOL N/A 02/04/2016   Procedure: ESOPHAGOGASTRODUODENOSCOPY (EGD) WITH PROPOFOL;  Surgeon: Manya Silvas, MD;  Location: [facility] ENDOSCOPY;  Service: Endoscopy;  Laterality: N/A;  . resection of colorectal cancer without sequela  1999  . resection of colorectal cancer without sequela    . TUBAL LIGATION  1976   Family History  Problem Relation Age of Onset  . Cirrhosis Sister   . Colon polyps Sister   . Leukemia Mother   . Depression Mother   . Heart disease Father   . Depression Father   . Bipolar disorder Daughter   . Prostate cancer Neg Hx   . Kidney cancer Neg Hx    Allergies  Allergen Reactions  . Clarithromycin Other (See  Comments)    Questionable Biaxin vs Flagyl with chest discomfort & irregular heartbeat (most likely thought to be related to the Biaxin)  . Lipitor  [Atorvastatin] Other (See Comments)    Mayalgia  . Metronidazole Other (See Comments)    Questionable Biaxin vs Flagyl with chest discomfort & irregular heartbeat (most likely thought to be related to the Biaxin)  . Other Other (See Comments)    decongestants  . Penicillins   . Simvastatin Other (See Comments)    Hari Loss  . Sulfa Antibiotics   . Toviaz  [Fesoterodine Fumarate Er] Other (See Comments)    weakness     Previous Medications   ALENDRONATE (FOSAMAX) 70 MG TABLET    Take 70 mg by mouth once a week. Reported on 08/18/2015   ASPIRIN 81 MG TABLET    Take 81 mg by mouth daily.   CALCIUM CARB-CHOLECALCIFEROL 600-200 MG-UNIT TABS    Take 1 tablet by mouth 2 (two) times daily.   CETIRIZINE (ZYRTEC) 10 MG TABLET    TAKE ONE TABLET BY MOUTH ONCE DAILY FOR ALLERGIES   CITALOPRAM (CELEXA) 20 MG TABLET    Take 20 mg by mouth daily.   DIPHENHYDRAMINE (BENADRYL) 25 MG TABLET    Take 25 mg by mouth at bedtime as needed.   ESOMEPRAZOLE (NEXIUM) 20 MG PACKET    Take one  tablet by mouth twice a day   LOVASTATIN (MEVACOR) 40 MG TABLET    Take 1 tablet (40 mg total) by mouth at bedtime.   MAGNESIUM 250 MG TABS    Take by mouth.   MIRTAZAPINE (REMERON) 15 MG TABLET    Take 15 mg by mouth at bedtime.   MULTIPLE VITAMINS-MINERALS (MULTIVITAMIN PO)    Take by mouth.   OMEGA-3 FATTY ACIDS (FISH OIL PO)    Take by mouth.   RANITIDINE (ZANTAC) 150 MG CAPSULE    Take 150 mg by mouth 2 (two) times daily.   SERTRALINE (ZOLOFT) 50 MG TABLET       TRIAMCINOLONE LOTION (KENALOG) 0.1 %    Apply 1 application topically 3 (three) times daily.   VITAMIN D, CHOLECALCIFEROL, 1000 UNITS CAPS    Take 1 capsule by mouth daily.   ZOLPIDEM (AMBIEN) 5 MG TABLET    Take 5 mg by mouth at bedtime as needed for sleep.    Review of Systems  Constitutional: Negative.     Respiratory: Negative.   Cardiovascular: Negative.   Genitourinary: Positive for frequency and pelvic pain.  Musculoskeletal: Positive for back pain.    Social History  Substance Use Topics  . Smoking status: Never Smoker  . Smokeless tobacco: Never Used  . Alcohol use No   Objective:   BP (!) 104/58 (BP Location: Right Arm, Patient Position: Sitting, Cuff Size: Normal)   Pulse 73   Temp 97.8 F (36.6 C) (Oral)   Resp 16   Wt 121 lb 6.4 oz (55.1 kg)   SpO2 95%   BMI 23.71 kg/m   Physical Exam  Constitutional: She is oriented to person, place, and time. She appears well-developed and well-nourished. No distress.  HENT:  Head: Normocephalic and atraumatic.  Right Ear: Hearing normal.  Left Ear: Hearing normal.  Nose: Nose normal.  Eyes: Conjunctivae and lids are normal. Right eye exhibits no discharge. Left eye exhibits no discharge. No scleral icterus.  Neck: Neck supple.  Cardiovascular: Normal rate and regular rhythm.   Pulmonary/Chest: Effort normal. No respiratory distress.  Abdominal: Soft. Bowel sounds are normal.  Musculoskeletal: She exhibits tenderness.  Left lower lumbar musculature to palpate or test flexion.  Neurological: She is alert and oriented to person, place, and time. She has normal reflexes.  Skin: Skin is intact. No lesion and no rash noted.  Psychiatric: She has a normal mood and affect. Her speech is normal and behavior is normal. Thought content normal.      Assessment & Plan:     1. Acute left-sided low back pain without sciatica Has had mid back pains and pressure ache for years. Recent left lower lumbar musculature pain with reaching overhead or bending over. No radiation to lower extremities or numbness. Suspect low back strain. Will get x-ray to rule out compression fracture with history of osteoporosis. May use Ibuprofen 200 mg 3 tablets 3-4 times a day with food and add muscle relaxant. May use Aspercreme with Lidocaine for spasms. Given  back exercises and apply moist heat. Recheck pending x-ray report. Normal urinalysis. - POCT Urinalysis Dipstick - DG Lumbar Spine Complete - cyclobenzaprine (FLEXERIL) 5 MG tablet; Take 1 tablet (5 mg total) by mouth 3 (three) times daily as needed for muscle spasms.  Dispense: 30 tablet; Refill: 1  2. Osteoporosis, unspecified osteoporosis type, unspecified pathological fracture presence GYN did BMD test 06-02-16 which showed osteoporosis in lumbar spine (-3.5). Had been off the fosamax for 3-4 months.  Still taking calcium and vitamin-D supplement daily. Restarted fosamax and need refills. Will get x-rays to rule out compression fracture. - alendronate (FOSAMAX) 70 MG tablet; Take 1 tablet (70 mg total) by mouth once a week. Reported on 08/18/2015  Dispense: 12 tablet; Refill: 3  3. Chronic reflux esophagitis LA Grade A esophagitis without ulcer or bleeding on upper endoscopy by Dr. Vira Agar on 02-04-16. Continues to try to use OTC Nexium since insurance no longer covering prescription Nexium. No hematemesis or hematochezia/melena.  4. Multiple gastric polyps Evaluated by Dr. Vira Agar (gastroenterologist) by upper endoscopy on 02-04-16. No dysplasia in polyps.

## 2016-07-20 NOTE — Patient Instructions (Signed)

## 2016-07-21 ENCOUNTER — Other Ambulatory Visit: Payer: Self-pay | Admitting: Family Medicine

## 2016-07-22 NOTE — Telephone Encounter (Signed)
LOV 07/20/2016. Last refill 01/08/2016. Renaldo Fiddler, CMA

## 2016-07-27 ENCOUNTER — Ambulatory Visit: Payer: Medicare Other

## 2016-07-29 DIAGNOSIS — Z634 Disappearance and death of family member: Secondary | ICD-10-CM | POA: Diagnosis not present

## 2016-07-29 DIAGNOSIS — F331 Major depressive disorder, recurrent, moderate: Secondary | ICD-10-CM | POA: Diagnosis not present

## 2016-07-29 DIAGNOSIS — F411 Generalized anxiety disorder: Secondary | ICD-10-CM | POA: Diagnosis not present

## 2016-08-12 ENCOUNTER — Ambulatory Visit (INDEPENDENT_AMBULATORY_CARE_PROVIDER_SITE_OTHER): Payer: Medicare Other | Admitting: Family Medicine

## 2016-08-12 ENCOUNTER — Encounter: Payer: Self-pay | Admitting: Family Medicine

## 2016-08-12 VITALS — BP 98/52 | HR 75 | Temp 97.5°F | Resp 14 | Wt 118.8 lb

## 2016-08-12 DIAGNOSIS — E782 Mixed hyperlipidemia: Secondary | ICD-10-CM | POA: Diagnosis not present

## 2016-08-12 DIAGNOSIS — M545 Low back pain, unspecified: Secondary | ICD-10-CM

## 2016-08-12 DIAGNOSIS — F418 Other specified anxiety disorders: Secondary | ICD-10-CM | POA: Diagnosis not present

## 2016-08-12 DIAGNOSIS — F419 Anxiety disorder, unspecified: Secondary | ICD-10-CM

## 2016-08-12 DIAGNOSIS — F329 Major depressive disorder, single episode, unspecified: Secondary | ICD-10-CM

## 2016-08-12 MED ORDER — LOVASTATIN 40 MG PO TABS: 40.0000 mg | ORAL_TABLET | Freq: Every day | ORAL | 3 refills | Status: DC

## 2016-08-12 NOTE — Progress Notes (Signed)
Patient: Ann Young Female    DOB: 1940-10-13   76 y.o.   MRN: [id number] Visit Date: 08/12/2016  Today's Provider: Vernie Murders, PA   Chief Complaint  Patient presents with  . Back Pain  . Follow-up   Subjective:    HPI  Lower Back Pain: Patient is here for a 3 week follow up. Back x-ray did not show any compression fractures.  She was advised to use Flexeril 5 mg, apply moist heating pad and do back exercise as tolerated. Patient reports fair compliance with treatment plan. She reports taking Flexeril only twice, she did not do back exercise, and only used heating pad no moist application. Symptoms are waxing and waning.   Patient Active Problem List   Diagnosis Date Noted  . Chronic reflux esophagitis 07/20/2016  . Multiple gastric polyps 07/20/2016  . History of allergy 08/18/2015  . Osteoporosis 05/07/2015  . Constipation, chronic 05/07/2015  . History of colorectal cancer 05/07/2015  . Depression with anxiety 05/07/2015  . Arthritis 05/07/2015  . Mixed hyperlipidemia 05/07/2015  . GERD with stricture 05/07/2015  . Low back pain 02/10/2015  . Problems influencing health status 10/28/2014  . Arthritis 10/28/2014  . Malignant neoplasm ([place]) 10/28/2014  . History of colonic polyps 10/28/2014  . Chronic constipation 10/28/2014  . Anxiety and depression 10/28/2014  . Acid reflux 10/28/2014  . History of malignant neoplasm of large intestine 10/28/2014  . 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 OP (osteoporosis) 10/28/2014  . Gastric ulcer 10/28/2014  . 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 Awareness of heartbeats 03/25/2014   Past Surgical History:  Procedure Laterality Date  . BREAST BIOPSY Left 2000   benign  . CARDIAC CATHETERIZATION  2015   WNL  . CATARACT EXTRACTION Bilateral 2013  . CATARACT EXTRACTION, BILATERAL Bilateral 2013   Dr. Willy Eddy  . COLONOSCOPY WITH PROPOFOL N/A 02/04/2016   Procedure:  COLONOSCOPY WITH PROPOFOL;  Surgeon: Manya Silvas, MD;  Location: [facility] ENDOSCOPY;  Service: Endoscopy;  Laterality: N/A;  . ESOPHAGOGASTRODUODENOSCOPY (EGD) WITH PROPOFOL N/A 02/04/2016   Procedure: ESOPHAGOGASTRODUODENOSCOPY (EGD) WITH PROPOFOL;  Surgeon: Manya Silvas, MD;  Location: [facility] ENDOSCOPY;  Service: Endoscopy;  Laterality: N/A;  . resection of colorectal cancer without sequela  1999  . resection of colorectal cancer without sequela    . TUBAL LIGATION  1976   Family History  Problem Relation Age of Onset  . Cirrhosis Sister   . Colon polyps Sister   . Leukemia Mother   . Depression Mother   . Heart disease Father   . Depression Father   . Bipolar disorder Daughter   . Prostate cancer Neg Hx   . Kidney cancer Neg Hx    Allergies  Allergen Reactions  . Clarithromycin Other (See Comments)    Questionable Biaxin vs Flagyl with chest discomfort & irregular heartbeat (most likely thought to be related to the Biaxin)  . Lipitor  [Atorvastatin] Other (See Comments)    Mayalgia  . Metronidazole Other (See Comments)    Questionable Biaxin vs Flagyl with chest discomfort & irregular heartbeat (most likely thought to be related to the Biaxin)  . Other Other (See Comments)    decongestants  . Penicillins   . Simvastatin Other (See Comments)    Hari Loss  . Sulfa Antibiotics   . Toviaz  [Fesoterodine Fumarate Er] Other (See Comments)    weakness     Previous Medications   ALENDRONATE (FOSAMAX) 70 MG TABLET  Take 1 tablet (70 mg total) by mouth once a week. Reported on 08/18/2015   ASPIRIN 81 MG TABLET    Take 81 mg by mouth daily.   CALCIUM CARB-CHOLECALCIFEROL 600-200 MG-UNIT TABS    Take 1 tablet by mouth 2 (two) times daily.   CETIRIZINE (ZYRTEC) 10 MG TABLET    TAKE ONE TABLET BY MOUTH ONCE DAILY FOR ALLERGIES   CITALOPRAM (CELEXA) 20 MG TABLET    Take 20 mg by mouth daily.   CYCLOBENZAPRINE (FLEXERIL) 5 MG TABLET    Take 1 tablet (5 mg total) by mouth 3 (three)  times daily as needed for muscle spasms.   DIPHENHYDRAMINE (BENADRYL) 25 MG TABLET    Take 25 mg by mouth at bedtime as needed.   ESOMEPRAZOLE (NEXIUM) 20 MG PACKET    Take one tablet by mouth twice a day   LOVASTATIN (MEVACOR) 40 MG TABLET    Take 1 tablet (40 mg total) by mouth at bedtime.   MAGNESIUM 250 MG TABS    Take by mouth.   MULTIPLE VITAMINS-MINERALS (MULTIVITAMIN PO)    Take by mouth.   OMEGA-3 FATTY ACIDS (FISH OIL PO)    Take by mouth.   RANITIDINE (ZANTAC) 150 MG CAPSULE    Take 150 mg by mouth 2 (two) times daily.   SERTRALINE (ZOLOFT) 50 MG TABLET       TRAZODONE (DESYREL) 100 MG TABLET    Take 100 mg by mouth at bedtime.   TRIAMCINOLONE LOTION (KENALOG) 0.1 %    Apply 1 application topically 3 (three) times daily.   VITAMIN D, CHOLECALCIFEROL, 1000 UNITS CAPS    Take 1 capsule by mouth daily.   ZOLPIDEM (AMBIEN) 5 MG TABLET    Take 5 mg by mouth at bedtime as needed for sleep.    Review of Systems  Constitutional: Negative.   Respiratory: Negative.   Cardiovascular: Negative.   Musculoskeletal: Positive for back pain.    Social History  Substance Use Topics  . Smoking status: Never Smoker  . Smokeless tobacco: Never Used  . Alcohol use No   Objective:   BP (!) 98/52 (BP Location: Right Arm, Patient Position: Sitting, Cuff Size: Normal)   Pulse 75   Temp 97.5 F (36.4 C) (Oral)   Resp 14   Wt 118 lb 12.8 oz (53.9 kg)   SpO2 95%   BMI 23.20 kg/m   Physical Exam  Constitutional: She is oriented to person, place, and time. She appears well-developed and well-nourished.  HENT:  Head: Normocephalic.  Right Ear: External ear normal.  Left Ear: External ear normal.  Nose: Nose normal.  Mouth/Throat: Oropharynx is clear and moist.  Eyes: Conjunctivae are normal.  Pale.  Neck: Neck supple.  Cardiovascular: Normal rate and regular rhythm.   Pulmonary/Chest: Effort normal and breath sounds normal.  Abdominal: Soft. Bowel sounds are normal. She exhibits no  mass. There is no tenderness.  Musculoskeletal: Normal range of motion. She exhibits no tenderness.  Lymphadenopathy:    She has no cervical adenopathy.  Neurological: She is alert and oriented to person, place, and time.  Psychiatric: Her speech is normal and behavior is normal. Thought content normal. Her mood appears anxious. Her affect is blunt.   Fall Risk  08/13/2016  Falls in the past year? No   Depression screen PHQ 2/9 08/13/2016  Decreased Interest 0  Down, Depressed, Hopeless 0  PHQ - 2 Score 0  Altered sleeping -  Tired, decreased energy -  Change  in appetite -  Feeling bad or failure about yourself  -  Trouble concentrating -  Moving slowly or fidgety/restless -  Suicidal thoughts -  PHQ-9 Score -        Assessment & Plan:     1. Low back pain without sciatica, unspecified back pain laterality, unspecified chronicity Not much relief from Flexeril 5 mg TID prn. Did not try the Aspercreme with Lidocaine or moist heat application. Feeling some better. X-rays of lumbar spine showed mild degenerative changes. Reprinted back exercises and recommended she use the Aspercreme with Lidocaine. Check routine labs. - CBC with Differential/Platelet - Comprehensive metabolic panel  2. Mixed hyperlipidemia Tolerating Lovastatin 40 mg qd without signs of side effects. Will continue present dosage with low fat diet and recheck labs. - lovastatin (MEVACOR) 40 MG tablet; Take 1 tablet (40 mg total) by mouth at bedtime.  Dispense: 90 tablet; Refill: 3 - Comprehensive metabolic panel - Lipid panel - TSH  3. Anxiety and depression Presently on Celexa and Trazodone per psychiatrist (Dr. Nicolasa Ducking). No suicidal ideation at the present. Recommend she continue follow up with Dr. Nicolasa Ducking.

## 2016-08-12 NOTE — Patient Instructions (Signed)

## 2016-08-16 ENCOUNTER — Encounter: Payer: Self-pay | Admitting: Family Medicine

## 2016-08-16 ENCOUNTER — Ambulatory Visit (INDEPENDENT_AMBULATORY_CARE_PROVIDER_SITE_OTHER): Payer: Medicare Other | Admitting: Family Medicine

## 2016-08-16 VITALS — BP 92/64 | HR 68 | Temp 97.7°F | Resp 16 | Wt 118.0 lb

## 2016-08-16 DIAGNOSIS — F418 Other specified anxiety disorders: Secondary | ICD-10-CM | POA: Diagnosis not present

## 2016-08-16 DIAGNOSIS — F419 Anxiety disorder, unspecified: Secondary | ICD-10-CM

## 2016-08-16 DIAGNOSIS — E782 Mixed hyperlipidemia: Secondary | ICD-10-CM | POA: Diagnosis not present

## 2016-08-16 DIAGNOSIS — F329 Major depressive disorder, single episode, unspecified: Secondary | ICD-10-CM

## 2016-08-16 DIAGNOSIS — M545 Low back pain: Secondary | ICD-10-CM | POA: Diagnosis not present

## 2016-08-16 DIAGNOSIS — R350 Frequency of micturition: Secondary | ICD-10-CM | POA: Diagnosis not present

## 2016-08-16 LAB — POCT URINALYSIS DIPSTICK
BILIRUBIN UA: NEGATIVE
Glucose, UA: NEGATIVE
Ketones, UA: NEGATIVE
Leukocytes, UA: NEGATIVE
Nitrite, UA: NEGATIVE
PH UA: 7
Protein, UA: NEGATIVE
RBC UA: NEGATIVE
Spec Grav, UA: 1.015
UROBILINOGEN UA: 0.2

## 2016-08-16 NOTE — Progress Notes (Signed)
Patient: Ann Young Female    DOB: 07/17/1940   76 y.o.   MRN: [id number] Visit Date: 08/16/2016  Today's Provider: Vernie Murders, PA   Chief Complaint  Patient presents with  . Urinary Frequency    Has been going on for several weeks.    Subjective:    Urinary Frequency   This is a new problem. The current episode started 1 to 4 weeks ago. The problem has been gradually worsening. The patient is experiencing no pain. There has been no fever. Associated symptoms include frequency, nausea and urgency. Pertinent negatives include no chills, discharge, hematuria, hesitancy, sweats or vomiting.    Allergies  Allergen Reactions  . Clarithromycin Other (See Comments)    Questionable Biaxin vs Flagyl with chest discomfort & irregular heartbeat (most likely thought to be related to the Biaxin)  . Lipitor  [Atorvastatin] Other (See Comments)    Mayalgia  . Metronidazole Other (See Comments)    Questionable Biaxin vs Flagyl with chest discomfort & irregular heartbeat (most likely thought to be related to the Biaxin)  . Other Other (See Comments)    decongestants  . Penicillins   . Simvastatin Other (See Comments)    Hari Loss  . Sulfa Antibiotics   . Toviaz  [Fesoterodine Fumarate Er] Other (See Comments)    weakness     Current Outpatient Prescriptions:  .  alendronate (FOSAMAX) 70 MG tablet, Take 1 tablet (70 mg total) by mouth once a week. Reported on 08/18/2015, Disp: 12 tablet, Rfl: 3 .  aspirin 81 MG tablet, Take 81 mg by mouth daily., Disp: , Rfl:  .  Calcium Carb-Cholecalciferol 600-200 MG-UNIT TABS, Take 1 tablet by mouth 2 (two) times daily., Disp: , Rfl:  .  cetirizine (ZYRTEC) 10 MG tablet, TAKE ONE TABLET BY MOUTH ONCE DAILY FOR ALLERGIES, Disp: 90 tablet, Rfl: 0 .  citalopram (CELEXA) 20 MG tablet, Take 20 mg by mouth daily., Disp: , Rfl:  .  cyclobenzaprine (FLEXERIL) 5 MG tablet, Take 1 tablet (5 mg total) by mouth 3 (three) times daily as needed for  muscle spasms., Disp: 30 tablet, Rfl: 1 .  diphenhydrAMINE (BENADRYL) 25 MG tablet, Take 25 mg by mouth at bedtime as needed., Disp: , Rfl:  .  lovastatin (MEVACOR) 40 MG tablet, Take 1 tablet (40 mg total) by mouth at bedtime., Disp: 90 tablet, Rfl: 3 .  Magnesium 250 MG TABS, Take by mouth., Disp: , Rfl:  .  Multiple Vitamins-Minerals (MULTIVITAMIN PO), Take by mouth., Disp: , Rfl:  .  Omega-3 Fatty Acids (FISH OIL PO), Take by mouth., Disp: , Rfl:  .  ranitidine (ZANTAC) 150 MG capsule, Take 150 mg by mouth 2 (two) times daily., Disp: , Rfl:  .  triamcinolone lotion (KENALOG) 0.1 %, Apply 1 application topically 3 (three) times daily., Disp: , Rfl:  .  Vitamin D, Cholecalciferol, 1000 UNITS CAPS, Take 1 capsule by mouth daily., Disp: , Rfl:   Review of Systems  Constitutional: Negative for activity change, appetite change, chills, diaphoresis, fatigue, fever and unexpected weight change.  Gastrointestinal: Positive for abdominal pain and nausea. Negative for abdominal distention, anal bleeding, blood in stool, constipation, diarrhea, rectal pain and vomiting.  Genitourinary: Positive for frequency and urgency. Negative for decreased urine volume, difficulty urinating, dyspareunia, dysuria, hematuria, hesitancy, vaginal bleeding, vaginal discharge and vaginal pain.  Musculoskeletal: Positive for back pain (Left sided lower back.).  Neurological: Positive for light-headedness and headaches. Negative for dizziness.  Social History  Substance Use Topics  . Smoking status: Never Smoker  . Smokeless tobacco: Never Used  . Alcohol use No   Objective:   BP 92/64 (BP Location: Left Arm, Patient Position: Sitting, Cuff Size: Normal)   Pulse 68   Temp 97.7 F (36.5 C) (Oral)   Resp 16   Wt 118 lb (53.5 kg)   BMI 23.05 kg/m  Vitals:   08/16/16 1154  BP: 92/64  Pulse: 68  Resp: 16  Temp: 97.7 F (36.5 C)  TempSrc: Oral  Weight: 118 lb (53.5 kg)     Physical Exam  Constitutional:  She is oriented to person, place, and time. She appears well-developed and well-nourished.  HENT:  Head: Normocephalic.  Eyes: Conjunctivae are normal.  Neck: Neck supple.  Cardiovascular: Normal rate and regular rhythm.   Pulmonary/Chest: Effort normal and breath sounds normal.  Abdominal: Soft. Bowel sounds are normal. There is no tenderness.  Neurological: She is alert and oriented to person, place, and time.  Psychiatric: Her behavior is normal. Thought content normal. Her mood appears anxious.      Assessment & Plan:     1. Urinary frequency Get up a couple times a night to urinate. If she does not, she may experience some leakage before getting to the bathroom. No frequency during the day. No dysuria. Has increase water intake due to low BP recently. Will get labs drawn today as ordered last week. - POCT urinalysis dipstick  2. Anxiety and depression With BP down to 80/40's at home, Dr. Nicolasa Ducking (psychiatrist) recommended she stop the Trazodone and increased Celexa to 40 mg qd. Has stopped using Benadryl for sleep (Dr. Nicolasa Ducking gave a trial of Ambien 5 mg hs to start tonight). Continue follow up with psychiatrist and document BP readings from home. Call report in 2 weeks.       Vernie Murders, PA  [facility]

## 2016-08-17 LAB — CBC WITH DIFFERENTIAL/PLATELET
BASOS ABS: 0 10*3/uL (ref 0.0–0.2)
Basos: 1 %
EOS (ABSOLUTE): 0.1 10*3/uL (ref 0.0–0.4)
Eos: 1 %
HEMOGLOBIN: 13.7 g/dL (ref 11.1–15.9)
Hematocrit: 39.8 % (ref 34.0–46.6)
Immature Grans (Abs): 0 10*3/uL (ref 0.0–0.1)
Immature Granulocytes: 0 %
LYMPHS ABS: 1.5 10*3/uL (ref 0.7–3.1)
Lymphs: 27 %
MCH: 31.9 pg (ref 26.6–33.0)
MCHC: 34.4 g/dL (ref 31.5–35.7)
MCV: 93 fL (ref 79–97)
MONOCYTES: 8 %
Monocytes Absolute: 0.5 10*3/uL (ref 0.1–0.9)
NEUTROS ABS: 3.6 10*3/uL (ref 1.4–7.0)
Neutrophils: 63 %
Platelets: 162 10*3/uL (ref 150–379)
RBC: 4.29 x10E6/uL (ref 3.77–5.28)
RDW: 12.4 % (ref 12.3–15.4)
WBC: 5.6 10*3/uL (ref 3.4–10.8)

## 2016-08-17 LAB — COMPREHENSIVE METABOLIC PANEL
ALBUMIN: 4.7 g/dL (ref 3.5–4.8)
ALK PHOS: 43 IU/L (ref 39–117)
ALT: 18 IU/L (ref 0–32)
AST: 21 IU/L (ref 0–40)
Albumin/Globulin Ratio: 2 (ref 1.2–2.2)
BILIRUBIN TOTAL: 0.4 mg/dL (ref 0.0–1.2)
BUN / CREAT RATIO: 24 (ref 12–28)
BUN: 17 mg/dL (ref 8–27)
CHLORIDE: 99 mmol/L (ref 96–106)
CO2: 27 mmol/L (ref 18–29)
Calcium: 9.2 mg/dL (ref 8.7–10.3)
Creatinine, Ser: 0.7 mg/dL (ref 0.57–1.00)
GFR calc Af Amer: 99 mL/min/{1.73_m2} (ref >59)
GFR calc non Af Amer: 86 mL/min/{1.73_m2} (ref >59)
GLOBULIN, TOTAL: 2.4 g/dL (ref 1.5–4.5)
Glucose: 94 mg/dL (ref 65–99)
Potassium: 4.1 mmol/L (ref 3.5–5.2)
SODIUM: 142 mmol/L (ref 134–144)
Total Protein: 7.1 g/dL (ref 6.0–8.5)

## 2016-08-17 LAB — LIPID PANEL
CHOLESTEROL TOTAL: 205 mg/dL — AB (ref 100–199)
Chol/HDL Ratio: 2.9 ratio units (ref 0.0–4.4)
HDL: 71 mg/dL (ref >39)
LDL CALC: 119 mg/dL — AB (ref 0–99)
Triglycerides: 73 mg/dL (ref 0–149)
VLDL Cholesterol Cal: 15 mg/dL (ref 5–40)

## 2016-08-17 LAB — TSH: TSH: 1.19 u[IU]/mL (ref 0.450–4.500)

## 2016-08-19 DIAGNOSIS — Z634 Disappearance and death of family member: Secondary | ICD-10-CM | POA: Diagnosis not present

## 2016-08-19 DIAGNOSIS — F331 Major depressive disorder, recurrent, moderate: Secondary | ICD-10-CM | POA: Diagnosis not present

## 2016-08-19 DIAGNOSIS — F411 Generalized anxiety disorder: Secondary | ICD-10-CM | POA: Diagnosis not present

## 2016-08-30 DIAGNOSIS — L820 Inflamed seborrheic keratosis: Secondary | ICD-10-CM | POA: Diagnosis not present

## 2016-08-30 DIAGNOSIS — L853 Xerosis cutis: Secondary | ICD-10-CM | POA: Diagnosis not present

## 2016-08-30 DIAGNOSIS — L814 Other melanin hyperpigmentation: Secondary | ICD-10-CM | POA: Diagnosis not present

## 2016-08-30 DIAGNOSIS — L299 Pruritus, unspecified: Secondary | ICD-10-CM | POA: Diagnosis not present

## 2016-08-30 DIAGNOSIS — D229 Melanocytic nevi, unspecified: Secondary | ICD-10-CM | POA: Diagnosis not present

## 2016-09-01 ENCOUNTER — Encounter: Payer: Self-pay | Admitting: Physician Assistant

## 2016-09-01 ENCOUNTER — Ambulatory Visit (INDEPENDENT_AMBULATORY_CARE_PROVIDER_SITE_OTHER): Payer: Medicare Other | Admitting: Physician Assistant

## 2016-09-01 ENCOUNTER — Telehealth: Payer: Self-pay

## 2016-09-01 VITALS — BP 98/60 | HR 68 | Temp 98.5°F | Resp 16 | Wt 117.0 lb

## 2016-09-01 DIAGNOSIS — R110 Nausea: Secondary | ICD-10-CM

## 2016-09-01 DIAGNOSIS — F5101 Primary insomnia: Secondary | ICD-10-CM | POA: Diagnosis not present

## 2016-09-01 DIAGNOSIS — R350 Frequency of micturition: Secondary | ICD-10-CM | POA: Diagnosis not present

## 2016-09-01 DIAGNOSIS — K222 Esophageal obstruction: Secondary | ICD-10-CM | POA: Diagnosis not present

## 2016-09-01 DIAGNOSIS — K219 Gastro-esophageal reflux disease without esophagitis: Secondary | ICD-10-CM | POA: Diagnosis not present

## 2016-09-01 DIAGNOSIS — R824 Acetonuria: Secondary | ICD-10-CM

## 2016-09-01 MED ORDER — PANTOPRAZOLE SODIUM 40 MG PO TBEC: 40.0000 mg | DELAYED_RELEASE_TABLET | Freq: Every day | ORAL | 3 refills | Status: DC

## 2016-09-01 MED ORDER — TEMAZEPAM 7.5 MG PO CAPS: 7.5000 mg | ORAL_CAPSULE | Freq: Every evening | ORAL | 0 refills | Status: DC | PRN

## 2016-09-01 NOTE — Patient Instructions (Signed)
Hypotension  Hypotension, commonly called low blood pressure, is when the force of blood pumping through your arteries is too weak. Arteries are blood vessels that carry blood from the heart throughout the body. When blood pressure is too low, you may not get enough blood to your brain or to the rest of your organs. This can cause weakness, light-headedness, rapid heartbeat, and fainting.  Depending on the cause and severity, hypotension may be harmless (benign) or cause serious problems (critical).  What are the causes?  Possible causes of hypotension include:  · Blood loss.  · Loss of body fluids (dehydration).  · Heart problems.  · Hormone (endocrine) problems.  · Pregnancy.  · Severe infection.  · Lack of certain nutrients.  · Severe allergic reactions (anaphylaxis).  · Certain medicines, such as blood pressure medicine or medicines that make the body lose excess fluids (diuretics). Sometimes, hypotension can be caused by not taking medicine as directed, such as taking too much of a certain medicine.    What increases the risk?  Certain factors can make you more likely to develop hypotension, including:  · Age. Risk increases as you get older.  · Conditions that affect the heart or the central nervous system.  · Taking certain medicines, such as blood pressure medicine or diuretics.  · Being pregnant.    What are the signs or symptoms?  Symptoms of this condition may include:  · Weakness.  · Light-headedness.  · Dizziness.  · Blurred vision.  · Fatigue.  · Rapid heartbeat.  · Fainting, in severe cases.    How is this diagnosed?  This condition is diagnosed based on:  · Your medical history.  · Your symptoms.  · Your blood pressure measurement. Your health care provider will check your blood pressure when you are:  ? Lying down.  ? Sitting.  ? Standing.    A blood pressure reading is recorded as two numbers, such as "120 over 80" (or 120/80). The first ("top") number is called the systolic pressure. It is a  measure of the pressure in your arteries as your heart beats. The second ("bottom") number is called the diastolic pressure. It is a measure of the pressure in your arteries when your heart relaxes between beats. Blood pressure is measured in a unit called mm Hg. Healthy blood pressure for adults is 120/80. If your blood pressure is below 90/60, you may be diagnosed with hypotension.  Other information or tests that may be used to diagnose hypotension include:  · Your other vital signs, such as your heart rate and temperature.  · Blood tests.  · Tilt table test. For this test, you will be safely secured to a table that moves you from a lying position to an upright position. Your heart rhythm and blood pressure will be monitored during the test.    How is this treated?  Treatment for this condition may include:  · Changing your diet. This may involve eating more salt (sodium) or drinking more water.  · Taking medicines to raise your blood pressure.  · Changing the dosage of certain medicines you are taking that might be lowering your blood pressure.  · Wearing compression stockings. These stockings help to prevent blood clots and reduce swelling in your legs.    In some cases, you may need to go to the hospital for:  · Fluid replacement. This means you will receive fluids through an IV tube.  · Blood replacement. This means you will receive   donated blood through an IV tube (transfusion).  · Treating an infection or heart problems, if this applies.  · Monitoring. You may need to be monitored while medicines that you are taking wear off.    Follow these instructions at home:  Eating and drinking     · Drink enough fluid to keep your urine clear or pale yellow.  · Eat a healthy diet and follow instructions from your health care provider about eating or drinking restrictions. A healthy diet includes:  ? Fresh fruits and vegetables.  ? Whole grains.  ? Lean meats.  ? Low-fat dairy products.  · Eat extra salt only as  directed. Do not add extra salt to your diet unless your health care provider told you to do that.  · Eat frequent, small meals.  · Avoid standing up suddenly after eating.  Medicines   · Take over-the-counter and prescription medicines only as told by your health care provider.  ? Follow instructions from your health care provider about changing the dosage of your current medicines, if this applies.  ? Do not stop or adjust any of your medicines on your own.  General instructions   · Wear compression stockings as told by your health care provider.  · Get up slowly from lying down or sitting positions. This gives your blood pressure a chance to adjust.  · Avoid hot showers and excessive heat as directed by your health care provider.  · Return to your normal activities as told by your health care provider. Ask your health care provider what activities are safe for you.  · Do not use any products that contain nicotine or tobacco, such as cigarettes and e-cigarettes. If you need help quitting, ask your health care provider.  · Keep all follow-up visits as told by your health care provider. This is important.  Contact a health care provider if:  · You vomit.  · You have diarrhea.  · You have a fever for more than 2-3 days.  · You feel more thirsty than usual.  · You feel weak and tired.  Get help right away if:  · You have chest pain.  · You have a fast or irregular heartbeat.  · You develop numbness in any part of your body.  · You cannot move your arms or your legs.  · You have trouble speaking.  · You become sweaty or feel light-headed.  · You faint.  · You feel short of breath.  · You have trouble staying awake.  · You feel confused.  This information is not intended to replace advice given to you by your health care provider. Make sure you discuss any questions you have with your health care provider.  Document Released: 05/31/2005 Document Revised: 12/19/2015 Document Reviewed: 11/20/2015  Elsevier Interactive  Patient Education © 2017 [employer]

## 2016-09-01 NOTE — Telephone Encounter (Signed)
Pt stated she was having trouble with Nausea today.  She denies vomiting, fevers.  She only wanted to see Simona Huh at first.  But I advised her he was out of the office today.  She was able to get a ride to seen [place] and 1:45 this afternoon.   Thanks,   -Mickel Baas
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
## 2016-09-02 ENCOUNTER — Ambulatory Visit: Payer: Self-pay | Admitting: Family Medicine

## 2016-09-03 DIAGNOSIS — R824 Acetonuria: Secondary | ICD-10-CM | POA: Insufficient documentation

## 2016-09-03 LAB — POCT URINALYSIS DIPSTICK
BILIRUBIN UA: NEGATIVE
Blood, UA: NEGATIVE
Glucose, UA: NEGATIVE
LEUKOCYTES UA: NEGATIVE
NITRITE UA: NEGATIVE
PROTEIN UA: NEGATIVE
Spec Grav, UA: 1.02 (ref 1.030–1.035)
Urobilinogen, UA: 0.2 (ref ?–2.0)
pH, UA: 6 (ref 5.0–8.0)

## 2016-09-06 DIAGNOSIS — G4700 Insomnia, unspecified: Secondary | ICD-10-CM | POA: Diagnosis not present

## 2016-09-06 DIAGNOSIS — F332 Major depressive disorder, recurrent severe without psychotic features: Secondary | ICD-10-CM | POA: Diagnosis not present

## 2016-09-06 DIAGNOSIS — F411 Generalized anxiety disorder: Secondary | ICD-10-CM | POA: Diagnosis not present

## 2016-09-06 DIAGNOSIS — F410 Panic disorder [episodic paroxysmal anxiety] without agoraphobia: Secondary | ICD-10-CM | POA: Diagnosis not present

## 2016-09-08 ENCOUNTER — Encounter: Payer: Self-pay | Admitting: Family Medicine

## 2016-09-08 ENCOUNTER — Telehealth: Payer: Self-pay | Admitting: Family Medicine

## 2016-09-08 ENCOUNTER — Ambulatory Visit (INDEPENDENT_AMBULATORY_CARE_PROVIDER_SITE_OTHER): Payer: Medicare Other | Admitting: Family Medicine

## 2016-09-08 VITALS — BP 124/70 | HR 73 | Temp 98.4°F | Resp 16 | Wt 118.2 lb

## 2016-09-08 DIAGNOSIS — Z0130 Encounter for examination of blood pressure without abnormal findings: Secondary | ICD-10-CM

## 2016-09-08 NOTE — Patient Instructions (Signed)
Take your blood pressure twice a week. Come in for a nurse bp check in one week.

## 2016-09-08 NOTE — Telephone Encounter (Signed)
Called Pt to schedule AWV with [facility] - knb °

## 2016-09-08 NOTE — Progress Notes (Signed)
Subjective:     Patient ID: Ann Young, female   DOB: 09/04/1940, 76 y.o.   MRN: [id number]  HPI  Chief Complaint  Patient presents with  . Blood Pressure Check    Patient comes in office today with concerns of fluctuating blood pressure readings over the past 30days. Patient states that reading this morning was 138/82, she states this is a high reading for her. Patient has been recording her blood pressure readings since 4/03, systolic has been ranging from 75-436 and diastolic readings have ranged 42-73. Associated patient reports fatigue, nausea, "hot spells", lightheadinss and tingling in hands.  . Medication Problem    Patient reports when she saw Mrs. Burnette in office on 3/21 she started her on Remero, patient reports since starying drug she has dealt with nausea and her hunger has increased.   States she has just started on a whole pill of Remeron last night per her psychiatrist (pending appointment in 6 weeks). Up To Date review reports hypertension as possible side effect. She is anxious about her bp and has taken it daily over the last few days. Had full lab panel 08/16/16 without significant abnormality.   Review of Systems     Objective:   Physical Exam  Constitutional: She appears well-developed and well-nourished. No distress.  Cardiovascular: Normal rate and regular rhythm.   Pulmonary/Chest: Breath sounds normal.  Musculoskeletal: She exhibits no edema (of lower extremities).  Psychiatric:  Mild anxiety       Assessment:    1. Blood pressure check    Plan:    Discussed home bp's twice weekly with nurse bp check one week.

## 2016-09-09 ENCOUNTER — Ambulatory Visit: Payer: Self-pay | Admitting: Family Medicine

## 2016-09-13 DIAGNOSIS — H6123 Impacted cerumen, bilateral: Secondary | ICD-10-CM | POA: Diagnosis not present

## 2016-09-13 DIAGNOSIS — R221 Localized swelling, mass and lump, neck: Secondary | ICD-10-CM | POA: Diagnosis not present

## 2016-09-14 ENCOUNTER — Telehealth: Payer: Self-pay | Admitting: Family Medicine

## 2016-09-14 NOTE — Telephone Encounter (Signed)
Pt called wanting to be referred to a doctor who can treat her osteoporosis.  She said she has been on and off the alendronate (FOSAMAX) 70 MG tablet now for 5 years and is not supposed to take it more than 5 years.   Call back is [phone]  Thanks teri

## 2016-09-14 NOTE — Telephone Encounter (Signed)
Taking a break from the Fosamax after being on it for 5 years is possible depending on history of fractures and degree of osteoporosis. Getting a current bone density test to see if she has had enough improvement to take a break is possible now. [facility] does not indicate it has to be stopped forever at 5 years. If bone density test 2 years after a break in taking the Alendronate indicates osteoporosis is still present, it could be used again at our discretion.

## 2016-09-15 ENCOUNTER — Ambulatory Visit: Payer: Medicare Other | Admitting: Physician Assistant

## 2016-09-15 NOTE — Telephone Encounter (Signed)
Patient advised as below. Patient states she recently had a bone density done last year through [place]. I checked in Care Everywhere and saw the report. It was done 06/02/2016. Patient wants you to review this and then tell her what you recommend.

## 2016-09-16 NOTE — Telephone Encounter (Signed)
Left message to call back  

## 2016-09-16 NOTE — Telephone Encounter (Signed)
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 Ouida Sills ordered on 06-02-16 at [facility]. What did Dr. Ouida Sills recommend? Looks like you still need the Foxamax and a recheck bone density test in 2 years. Since Dr. Ouida Sills ordered the test it will be up to him.

## 2016-09-17 NOTE — Telephone Encounter (Signed)
Pt reports that Dr. Ouida Sills told her to follow up/treatment with her PCP about this.

## 2016-09-17 NOTE — Telephone Encounter (Signed)
Please review

## 2016-09-17 NOTE — Telephone Encounter (Signed)
Continue Fosamax once a week and recheck BMD in 2 years from the last test.

## 2016-09-20 NOTE — Telephone Encounter (Signed)
Advised  ED 

## 2016-09-30 DIAGNOSIS — Z634 Disappearance and death of family member: Secondary | ICD-10-CM | POA: Diagnosis not present

## 2016-09-30 DIAGNOSIS — F411 Generalized anxiety disorder: Secondary | ICD-10-CM | POA: Diagnosis not present

## 2016-09-30 DIAGNOSIS — F331 Major depressive disorder, recurrent, moderate: Secondary | ICD-10-CM | POA: Diagnosis not present

## 2016-10-11 ENCOUNTER — Telehealth: Payer: Self-pay | Admitting: Family Medicine

## 2016-10-11 ENCOUNTER — Other Ambulatory Visit: Payer: Self-pay | Admitting: Family Medicine

## 2016-10-11 DIAGNOSIS — Z889 Allergy status to unspecified drugs, medicaments and biological substances status: Secondary | ICD-10-CM

## 2016-10-11 MED ORDER — CETIRIZINE HCL 10 MG PO TABS: ORAL_TABLET | ORAL | 3 refills | Status: DC

## 2016-10-11 NOTE — Telephone Encounter (Signed)
Pt needs refill cetirizine (ZYRTEC) 10 MG tablet  90 days   [facility] [employer] [employer]

## 2016-10-21 DIAGNOSIS — F332 Major depressive disorder, recurrent severe without psychotic features: Secondary | ICD-10-CM | POA: Diagnosis not present

## 2016-10-21 DIAGNOSIS — G4700 Insomnia, unspecified: Secondary | ICD-10-CM | POA: Diagnosis not present

## 2016-10-21 DIAGNOSIS — F411 Generalized anxiety disorder: Secondary | ICD-10-CM | POA: Diagnosis not present

## 2016-10-21 DIAGNOSIS — F410 Panic disorder [episodic paroxysmal anxiety] without agoraphobia: Secondary | ICD-10-CM | POA: Diagnosis not present

## 2016-12-23 ENCOUNTER — Ambulatory Visit: Payer: Self-pay | Admitting: Family Medicine

## 2016-12-24 ENCOUNTER — Ambulatory Visit (INDEPENDENT_AMBULATORY_CARE_PROVIDER_SITE_OTHER): Payer: Medicare Other | Admitting: Family Medicine

## 2016-12-24 ENCOUNTER — Encounter: Payer: Self-pay | Admitting: Family Medicine

## 2016-12-24 VITALS — BP 98/56 | HR 84 | Temp 98.0°F | Wt 122.8 lb

## 2016-12-24 DIAGNOSIS — K2100 Gastro-esophageal reflux disease with esophagitis, without bleeding: Secondary | ICD-10-CM

## 2016-12-24 DIAGNOSIS — E782 Mixed hyperlipidemia: Secondary | ICD-10-CM

## 2016-12-24 DIAGNOSIS — E162 Hypoglycemia, unspecified: Secondary | ICD-10-CM | POA: Diagnosis not present

## 2016-12-24 DIAGNOSIS — F418 Other specified anxiety disorders: Secondary | ICD-10-CM

## 2016-12-24 DIAGNOSIS — K317 Polyp of stomach and duodenum: Secondary | ICD-10-CM

## 2016-12-24 MED ORDER — PANTOPRAZOLE SODIUM 40 MG PO TBEC: 40.0000 mg | DELAYED_RELEASE_TABLET | Freq: Every day | ORAL | 1 refills | Status: DC

## 2016-12-24 NOTE — Progress Notes (Signed)
Patient: Ann Young Female    DOB: 12-28-1940   76 y.o.   MRN: [id number] Visit Date: 12/24/2016  Today's Provider: Vernie Murders, PA   Chief Complaint  Patient presents with  . Anxiety  . Insomnia  . Gastroesophageal Reflux  . Follow-up   Subjective:    HPI   GERD, Follow up:  The patient was last seen for GERD 4 months ago. Changes made since that visit include continue Protonix 40 mg BID.  She reports good compliance with treatment. She is not having side effects.   Anxiety:  Patient is here for a follow up. Dr. Nicolasa Ducking increased Celexa to 40 mg. She states symptoms are unchanged. Current symptoms include being anxious and panic attacks.  Insomnia:  Patient was previously taking Restoril which caused BP to decease. On 09-30-2016 she seen Anderson Malta and she switch medication to Remeron 30 mg. Patient states the Remeron has helped improve symptoms.  Patient Active Problem List   Diagnosis Date Noted  . Ketonuria 09/03/2016  . Chronic reflux esophagitis 07/20/2016  . Multiple gastric polyps 07/20/2016  . History of allergy 08/18/2015  . Osteoporosis 05/07/2015  . Constipation, chronic 05/07/2015  . History of colorectal cancer 05/07/2015  . Depression with anxiety 05/07/2015  . Arthritis 05/07/2015  . Mixed hyperlipidemia 05/07/2015  . GERD with stricture 05/07/2015  . Low back pain 02/10/2015  . Problems influencing health status 10/28/2014  . Malignant neoplasm ([place]) 10/28/2014  . History of colonic polyps 10/28/2014  . Anxiety and depression 10/28/2014  . Acid reflux 10/28/2014  . History of malignant neoplasm of large intestine 10/28/2014  . 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 Gastric ulcer 10/28/2014  . 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 Awareness of heartbeats 03/25/2014   Past Surgical History:  Procedure Laterality Date  . BREAST BIOPSY Left 2000   benign  . CARDIAC CATHETERIZATION  2015     WNL  . CATARACT EXTRACTION Bilateral 2013  . CATARACT EXTRACTION, BILATERAL Bilateral 2013   Dr. Willy Eddy  . COLONOSCOPY WITH PROPOFOL N/A 02/04/2016   Procedure: COLONOSCOPY WITH PROPOFOL;  Surgeon: Manya Silvas, MD;  Location: [facility] ENDOSCOPY;  Service: Endoscopy;  Laterality: N/A;  . ESOPHAGOGASTRODUODENOSCOPY (EGD) WITH PROPOFOL N/A 02/04/2016   Procedure: ESOPHAGOGASTRODUODENOSCOPY (EGD) WITH PROPOFOL;  Surgeon: Manya Silvas, MD;  Location: [facility] ENDOSCOPY;  Service: Endoscopy;  Laterality: N/A;  . resection of colorectal cancer without sequela  1999  . resection of colorectal cancer without sequela    . TUBAL LIGATION  1976   Family History  Problem Relation Age of Onset  . Cirrhosis Sister   . Colon polyps Sister   . Leukemia Mother   . Depression Mother   . Heart disease Father   . Depression Father   . Bipolar disorder Daughter   . Prostate cancer Neg Hx   . Kidney cancer Neg Hx    Allergies  Allergen Reactions  . Clarithromycin Other (See Comments)    Questionable Biaxin vs Flagyl with chest discomfort & irregular heartbeat (most likely thought to be related to the Biaxin)  . Lipitor  [Atorvastatin] Other (See Comments)    Mayalgia  . Metronidazole Other (See Comments)    Questionable Biaxin vs Flagyl with chest discomfort & irregular heartbeat (most likely thought to be related to the Biaxin)  . Other Other (See Comments)    decongestants  . Penicillins   . Simvastatin Other (See Comments)    Hari Loss  . Sulfa  Antibiotics     Other reaction(s): Unknown  . Toviaz  [Fesoterodine Fumarate Er] Other (See Comments)    weakness    Previous Medications   ALENDRONATE (FOSAMAX) 70 MG TABLET    Take 1 tablet (70 mg total) by mouth once a week. Reported on 08/18/2015   ASPIRIN 81 MG TABLET    Take 81 mg by mouth daily.   CALCIUM CARB-CHOLECALCIFEROL 600-200 MG-UNIT TABS    Take 1 tablet by mouth 2 (two) times daily.   CETIRIZINE (ZYRTEC) 10 MG TABLET    TAKE ONE  TABLET BY MOUTH ONCE DAILY FOR ALLERGIES   CITALOPRAM (CELEXA) 40 MG TABLET       FLUTICASONE (FLONASE) 50 MCG/ACT NASAL SPRAY       LOVASTATIN (MEVACOR) 40 MG TABLET    Take 1 tablet (40 mg total) by mouth at bedtime.   MIRTAZAPINE (REMERON) 30 MG TABLET       MULTIPLE VITAMINS-MINERALS (MULTIVITAMIN PO)    Take by mouth.   OMEGA-3 FATTY ACIDS (FISH OIL PO)    Take by mouth.   PANTOPRAZOLE (PROTONIX) 40 MG TABLET    Take 1 tablet (40 mg total) by mouth daily.   TRIAMCINOLONE LOTION (KENALOG) 0.1 %    Apply 1 application topically 3 (three) times daily.   VITAMIN D, CHOLECALCIFEROL, 1000 UNITS CAPS    Take 1 capsule by mouth daily.    Review of Systems  Constitutional: Negative.   Respiratory: Negative.   Cardiovascular: Negative.   Psychiatric/Behavioral: Positive for sleep disturbance. The patient is nervous/anxious.     Social History  Substance Use Topics  . Smoking status: Never Smoker  . Smokeless tobacco: Never Used  . Alcohol use No   Objective:   BP (!) 98/56 (BP Location: Right Arm, Patient Position: Sitting, Cuff Size: Normal)   Pulse 84   Temp 98 F (36.7 C) (Oral)   Wt 122 lb 12.8 oz (55.7 kg)   SpO2 97%   BMI 23.98 kg/m  Wt Readings from Last 3 Encounters:  12/24/16 122 lb 12.8 oz (55.7 kg)  09/08/16 118 lb 3.2 oz (53.6 kg)  09/01/16 117 lb (53.1 kg)    Physical Exam  Constitutional: She is oriented to person, place, and time. She appears well-developed and well-nourished. No distress.  HENT:  Head: Normocephalic and atraumatic.  Right Ear: Hearing normal.  Left Ear: Hearing normal.  Nose: Nose normal.  Eyes: Conjunctivae and lids are normal. Right eye exhibits no discharge. Left eye exhibits no discharge. No scleral icterus.  Neck: Neck supple.  Cardiovascular: Normal rate and regular rhythm.   Pulmonary/Chest: Effort normal and breath sounds normal. No respiratory distress.  Abdominal: Soft. Bowel sounds are normal.  Musculoskeletal: Normal range of  motion.  Neurological: She is alert and oriented to person, place, and time.  Skin: Skin is intact. No lesion and no rash noted.  Psychiatric: Her speech is normal and behavior is normal. Thought content normal. Her mood appears anxious.      Assessment & Plan:     1. Depression with anxiety Stable with better sleep pattern using the Remeron 30 mg qd and Celexa 40 mg qd. Has been eating better and regained weight up to 122 lbs. Continues regular follow up with Dr. Nicolasa Ducking (psychiatrist). Will get CBC and TSH. - citalopram (CELEXA) 40 MG tablet; - mirtazapine (REMERON) 30 MG tablet;  - CBC with Differential/Platelet - TSH  2. Chronic reflux esophagitis Confirmed by upper endoscopy by Dr. Vira Agar on 02-03-17.  Concerned for early Barrett's. Will check CBC and CMP. Refill the Protonix and recheck in 3-6 months for step back to H2 blocker instead of the PPI. - CBC with Differential/Platelet - Comprehensive metabolic panel - pantoprazole (PROTONIX) 40 MG tablet; Take 1 tablet (40 mg total) by mouth daily.  Dispense: 90 tablet; Refill: 1  3. Multiple gastric polyps Documented and biopsied by upper endoscopy by Dr. Vira Agar (gastroenterologist) on 02-04-16. Path reports were benign. He recommended repeat exam in 5 years.  - pantoprazole (PROTONIX) 40 MG tablet; Take 1 tablet (40 mg total) by mouth daily.  Dispense: 90 tablet; Refill: 1  4. Mixed hyperlipidemia Tolerating Lovastatin without myalgias or arthralgias. Recheck lipids, TSH and CMP. - Comprehensive metabolic panel - TSH - Lipid panel  5. Hypoglycemia Has short term episodes of fatigue and jittery sensation 2-3 times a month. Always, seems to respond quickly to eating a cookie or peanut butter cracker. Suspect hypoglycemic episodes. Will check labs and follow up pending reports. - CBC with Differential/Platelet - Comprehensive metabolic panel - TSH

## 2016-12-27 DIAGNOSIS — K210 Gastro-esophageal reflux disease with esophagitis: Secondary | ICD-10-CM | POA: Diagnosis not present

## 2016-12-27 DIAGNOSIS — G4700 Insomnia, unspecified: Secondary | ICD-10-CM | POA: Diagnosis not present

## 2016-12-27 DIAGNOSIS — F410 Panic disorder [episodic paroxysmal anxiety] without agoraphobia: Secondary | ICD-10-CM | POA: Diagnosis not present

## 2016-12-27 DIAGNOSIS — F418 Other specified anxiety disorders: Secondary | ICD-10-CM | POA: Diagnosis not present

## 2016-12-27 DIAGNOSIS — E782 Mixed hyperlipidemia: Secondary | ICD-10-CM | POA: Diagnosis not present

## 2016-12-27 DIAGNOSIS — F411 Generalized anxiety disorder: Secondary | ICD-10-CM | POA: Diagnosis not present

## 2016-12-27 DIAGNOSIS — F332 Major depressive disorder, recurrent severe without psychotic features: Secondary | ICD-10-CM | POA: Diagnosis not present

## 2016-12-27 DIAGNOSIS — E162 Hypoglycemia, unspecified: Secondary | ICD-10-CM | POA: Diagnosis not present

## 2016-12-28 ENCOUNTER — Telehealth: Payer: Self-pay

## 2016-12-28 LAB — COMPREHENSIVE METABOLIC PANEL
A/G RATIO: 2 (ref 1.2–2.2)
ALBUMIN: 4.2 g/dL (ref 3.5–4.8)
ALT: 16 IU/L (ref 0–32)
AST: 19 IU/L (ref 0–40)
Alkaline Phosphatase: 37 IU/L — ABNORMAL LOW (ref 39–117)
BUN / CREAT RATIO: 25 (ref 12–28)
BUN: 21 mg/dL (ref 8–27)
Bilirubin Total: 0.2 mg/dL (ref 0.0–1.2)
CALCIUM: 9 mg/dL (ref 8.7–10.3)
CO2: 28 mmol/L (ref 20–29)
Chloride: 103 mmol/L (ref 96–106)
Creatinine, Ser: 0.84 mg/dL (ref 0.57–1.00)
GFR, EST AFRICAN AMERICAN: 79 mL/min/{1.73_m2} (ref >59)
GFR, EST NON AFRICAN AMERICAN: 69 mL/min/{1.73_m2} (ref >59)
GLOBULIN, TOTAL: 2.1 g/dL (ref 1.5–4.5)
Glucose: 93 mg/dL (ref 65–99)
POTASSIUM: 4.5 mmol/L (ref 3.5–5.2)
SODIUM: 142 mmol/L (ref 134–144)
Total Protein: 6.3 g/dL (ref 6.0–8.5)

## 2016-12-28 LAB — CBC WITH DIFFERENTIAL/PLATELET
BASOS: 1 %
Basophils Absolute: 0 10*3/uL (ref 0.0–0.2)
EOS (ABSOLUTE): 0.1 10*3/uL (ref 0.0–0.4)
EOS: 3 %
HEMATOCRIT: 38.5 % (ref 34.0–46.6)
Hemoglobin: 12.4 g/dL (ref 11.1–15.9)
IMMATURE GRANULOCYTES: 0 %
Immature Grans (Abs): 0 10*3/uL (ref 0.0–0.1)
LYMPHS ABS: 1.4 10*3/uL (ref 0.7–3.1)
Lymphs: 30 %
MCH: 31.7 pg (ref 26.6–33.0)
MCHC: 32.2 g/dL (ref 31.5–35.7)
MCV: 99 fL — AB (ref 79–97)
MONOS ABS: 0.4 10*3/uL (ref 0.1–0.9)
Monocytes: 9 %
Neutrophils Absolute: 2.6 10*3/uL (ref 1.4–7.0)
Neutrophils: 57 %
Platelets: 163 10*3/uL (ref 150–379)
RBC: 3.91 x10E6/uL (ref 3.77–5.28)
RDW: 12.6 % (ref 12.3–15.4)
WBC: 4.6 10*3/uL (ref 3.4–10.8)

## 2016-12-28 LAB — LIPID PANEL
CHOL/HDL RATIO: 2.6 ratio (ref 0.0–4.4)
CHOLESTEROL TOTAL: 188 mg/dL (ref 100–199)
HDL: 72 mg/dL (ref >39)
LDL CALC: 101 mg/dL — AB (ref 0–99)
Triglycerides: 75 mg/dL (ref 0–149)
VLDL Cholesterol Cal: 15 mg/dL (ref 5–40)

## 2016-12-28 LAB — TSH: TSH: 1.99 u[IU]/mL (ref 0.450–4.500)

## 2016-12-28 NOTE — Telephone Encounter (Signed)
LMTCB

## 2016-12-28 NOTE — Telephone Encounter (Signed)
-----   Message from Margo Common, [place] sent at 12/28/2016  1:50 PM EDT ----- All blood tests essentially normal. Continue present medications and recheck in 4-6 months.

## 2017-01-03 ENCOUNTER — Telehealth: Payer: Self-pay | Admitting: Family Medicine

## 2017-01-03 NOTE — Telephone Encounter (Signed)
Patient advised of lab results from 12/27/2016.

## 2017-01-03 NOTE — Telephone Encounter (Signed)
Pt called back regarding her lab work.  [phone]  Thanks Con Memos

## 2017-01-11 DIAGNOSIS — R938 Abnormal findings on diagnostic imaging of other specified body structures: Secondary | ICD-10-CM | POA: Diagnosis not present

## 2017-01-11 DIAGNOSIS — N9489 Other specified conditions associated with female genital organs and menstrual cycle: Secondary | ICD-10-CM | POA: Diagnosis not present

## 2017-01-13 ENCOUNTER — Telehealth: Payer: Self-pay | Admitting: Family Medicine

## 2017-01-13 NOTE — Telephone Encounter (Signed)
Left message about need to schedule AWV w [facility]

## 2017-01-17 ENCOUNTER — Ambulatory Visit (INDEPENDENT_AMBULATORY_CARE_PROVIDER_SITE_OTHER): Payer: Medicare Other | Admitting: Family Medicine

## 2017-01-17 ENCOUNTER — Encounter: Payer: Self-pay | Admitting: Family Medicine

## 2017-01-17 VITALS — BP 108/62 | HR 80 | Temp 97.7°F | Resp 12 | Wt 122.4 lb

## 2017-01-17 DIAGNOSIS — Z634 Disappearance and death of family member: Secondary | ICD-10-CM

## 2017-01-17 DIAGNOSIS — R300 Dysuria: Secondary | ICD-10-CM | POA: Diagnosis not present

## 2017-01-17 DIAGNOSIS — N3000 Acute cystitis without hematuria: Secondary | ICD-10-CM | POA: Diagnosis not present

## 2017-01-17 LAB — POCT UA - MICROSCOPIC ONLY
CASTS, UR, LPF, POC: NEGATIVE
Crystals, Ur, HPF, POC: NEGATIVE
Mucus, UA: NEGATIVE
YEAST UA: NEGATIVE

## 2017-01-17 LAB — POCT URINALYSIS DIPSTICK
Bilirubin, UA: NEGATIVE
Glucose, UA: NEGATIVE
KETONES UA: NEGATIVE
Nitrite, UA: NEGATIVE
PH UA: 6 (ref 5.0–8.0)
Protein, UA: NEGATIVE
Spec Grav, UA: 1.015 (ref 1.010–1.025)
UROBILINOGEN UA: 0.2 U/dL

## 2017-01-17 MED ORDER — NITROFURANTOIN MONOHYD MACRO 100 MG PO CAPS: 100.0000 mg | ORAL_CAPSULE | Freq: Two times a day (BID) | ORAL | 0 refills | Status: DC

## 2017-01-17 NOTE — Patient Instructions (Signed)

## 2017-01-17 NOTE — Progress Notes (Signed)
Ann Young  MRN: [id number] DOB: 1941/03/29  Subjective:  HPI  Patient feels like she has a urinary tract infection. Symptom started Friday August 3rd. Symptoms are urinary frequency, urgency, pelvic pressure, burning with urination, left lower back pain and decreased appetite. No nausea or vomiting, no fever or chills. Patient has not tried anything over the counter for the issue.  .  Patient Active Problem List   Diagnosis Date Noted  . Ketonuria 09/03/2016  . Chronic reflux esophagitis 07/20/2016  . Multiple gastric polyps 07/20/2016  . History of allergy 08/18/2015  . Osteoporosis 05/07/2015  . Constipation, chronic 05/07/2015  . History of colorectal cancer 05/07/2015  . Depression with anxiety 05/07/2015  . Arthritis 05/07/2015  . Mixed hyperlipidemia 05/07/2015  . GERD with stricture 05/07/2015  . Low back pain 02/10/2015  . Problems influencing health status 10/28/2014  . Malignant neoplasm ([place]) 10/28/2014  . History of colonic polyps 10/28/2014  . Anxiety and depression 10/28/2014  . Acid reflux 10/28/2014  . History of malignant neoplasm of large intestine 10/28/2014  . 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 Gastric ulcer 10/28/2014  . 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 Awareness of heartbeats 03/25/2014    Past Medical History:  Diagnosis Date  . Anxiety   . Arthritis   . Asthma   . Cancer ([place])   . Depression   . Dyspepsia   . Elevated cholesterol   . GERD (gastroesophageal reflux disease)   . History of hiatal hernia   . Peptic ulcer disease     Social History   Social History  . Marital status: Married    Spouse name: N/A  . Number of children: N/A  . Years of education: N/A   Occupational History  . Not on file.   Social History Main Topics  . Smoking status: Never Smoker  . Smokeless tobacco: Never Used  . Alcohol use No  . Drug use: No  . Sexual activity: Not on file     Other Topics Concern  . Not on file   Social History Narrative   ** Merged History Encounter **       Outpatient Encounter Prescriptions as of 01/17/2017  Medication Sig  . alendronate (FOSAMAX) 70 MG tablet Take 1 tablet (70 mg total) by mouth once a week. Reported on 08/18/2015  . aspirin 81 MG tablet Take 81 mg by mouth daily.  . Calcium Carb-Cholecalciferol 600-200 MG-UNIT TABS Take 1 tablet by mouth 2 (two) times daily.  . cetirizine (ZYRTEC) 10 MG tablet TAKE ONE TABLET BY MOUTH ONCE DAILY FOR ALLERGIES  . citalopram (CELEXA) 40 MG tablet   . fluticasone (FLONASE) 50 MCG/ACT nasal spray   . lovastatin (MEVACOR) 40 MG tablet Take 1 tablet (40 mg total) by mouth at bedtime.  . mirtazapine (REMERON) 30 MG tablet   . Multiple Vitamins-Minerals (MULTIVITAMIN PO) Take by mouth.  . Omega-3 Fatty Acids (FISH OIL PO) Take by mouth.  . pantoprazole (PROTONIX) 40 MG tablet Take 1 tablet (40 mg total) by mouth daily.  . Probiotic Product (PROBIOTIC-10 PO) Take 1 tablet by mouth daily.  Marland Kitchen triamcinolone lotion (KENALOG) 0.1 % Apply 1 application topically 3 (three) times daily.  . Vitamin D, Cholecalciferol, 1000 UNITS CAPS Take 1 capsule by mouth daily.   No facility-administered encounter medications on file as of 01/17/2017.     Allergies  Allergen Reactions  . Clarithromycin Other (See Comments)    Questionable Biaxin vs Flagyl with  chest discomfort & irregular heartbeat (most likely thought to be related to the Biaxin)  . Lipitor  [Atorvastatin] Other (See Comments)    Mayalgia  . Metronidazole Other (See Comments)    Questionable Biaxin vs Flagyl with chest discomfort & irregular heartbeat (most likely thought to be related to the Biaxin)  . Other Other (See Comments)    decongestants  . Penicillins   . Simvastatin Other (See Comments)    Hari Loss  . Sulfa Antibiotics     Other reaction(s): Unknown  . Toviaz  [Fesoterodine Fumarate Er] Other (See Comments)    weakness    Review of Systems  Constitutional: Negative.   Respiratory: Negative.   Cardiovascular: Negative.   Gastrointestinal: Negative.   Genitourinary: Positive for dysuria, flank pain, frequency and urgency.  Musculoskeletal: Positive for back pain.    Objective:  BP 108/62   Pulse 80   Temp 97.7 F (36.5 C)   Resp 12   Wt 122 lb 6.4 oz (55.5 kg)   BMI 23.90 kg/m   Physical Exam  Constitutional: She is well-developed, well-nourished, and in no distress.  HENT:  Head: Normocephalic.  Eyes: Conjunctivae are normal.  Neck: Neck supple.  Cardiovascular: Normal rate and regular rhythm.   Pulmonary/Chest: Effort normal and breath sounds normal.  Abdominal: Soft. Bowel sounds are normal. She exhibits no mass. There is tenderness. There is no guarding.  Suprapubic tenderness with palpation. Slight CVA tenderness with percussion. No masses.  Psychiatric: Mood normal. Her affect is blunt. She exhibits ordered thought content.   Depression screen [facility] 2/9 01/17/2017 08/13/2016 08/13/2016  Decreased Interest 1 0 0  Down, Depressed, Hopeless 1 0 0  PHQ - 2 Score 2 0 0  Altered sleeping 0 - 1  Tired, decreased energy 1 - 3  Change in appetite 0 - 0  Feeling bad or failure about yourself  0 - 0  Trouble concentrating 0 - 0  Moving slowly or fidgety/restless 1 - 1  Suicidal thoughts 0 - 0  PHQ-9 Score 4 - 5    Assessment and Plan :  1. Dysuria Sudden onset on 01-14-17 of bladder pressure, urgency and burning with urination. No fever or hematuria. Microscopic urinalysis showed pyuria with bacteriuria. Will start Macrobid and may use AZO-Standard for discomfort. Awaiting C&S report. - POCT urinalysis dipstick - POCT UA - Microscopic Only - Urine Culture  2. Acute cystitis without hematuria Urinalysis shows pyuria with bacteria on microscopic exam. Will start antibiotic and get C&S (had same type symptoms with UTI September 2017 and culture isolated E.coli that was sensitive to the A[place]a).  Increase fluid intake and recheck pending culture report. - nitrofurantoin, macrocrystal-monohydrate, (MACROBID) 100 MG capsule; Take 1 capsule (100 mg total) by mouth 2 (two) times daily.  Dispense: 20 capsule; Refill: 0  3. Bereavement Granddaughter died from a car accident 12/20/2016. Patient has a history of depression with generalized anxiety followed by Dr. Nicolasa Ducking (psychiatrist) and presently on Celexa with Remeron. Feels she is handling the stress well and continues regular follow up with Dr. Nicolasa Ducking.

## 2017-01-20 LAB — URINE CULTURE

## 2017-01-20 NOTE — Progress Notes (Signed)
Awaiting final report on culture. Preliminary report shows a gram negative rod-type bacteria. Continue present antibiotic.

## 2017-01-25 DIAGNOSIS — F411 Generalized anxiety disorder: Secondary | ICD-10-CM | POA: Diagnosis not present

## 2017-01-25 DIAGNOSIS — F332 Major depressive disorder, recurrent severe without psychotic features: Secondary | ICD-10-CM | POA: Diagnosis not present

## 2017-01-25 DIAGNOSIS — G4700 Insomnia, unspecified: Secondary | ICD-10-CM | POA: Diagnosis not present

## 2017-01-25 DIAGNOSIS — F410 Panic disorder [episodic paroxysmal anxiety] without agoraphobia: Secondary | ICD-10-CM | POA: Diagnosis not present

## 2017-01-27 ENCOUNTER — Telehealth: Payer: Self-pay | Admitting: Family Medicine

## 2017-01-27 NOTE — Telephone Encounter (Signed)
Pt wants a copy of her last labs for her preop.  If we have a cxr , ekg or echo she will need a copy of that also.  Pt's call back is [phone]  Thanks teri

## 2017-01-28 DIAGNOSIS — F411 Generalized anxiety disorder: Secondary | ICD-10-CM | POA: Diagnosis not present

## 2017-01-28 DIAGNOSIS — Z634 Disappearance and death of family member: Secondary | ICD-10-CM | POA: Diagnosis not present

## 2017-01-28 DIAGNOSIS — F331 Major depressive disorder, recurrent, moderate: Secondary | ICD-10-CM | POA: Diagnosis not present

## 2017-01-28 NOTE — Telephone Encounter (Signed)
Printed documentation patient requested that we have, advised patient on voicemail that these are ready-aa

## 2017-01-28 NOTE — H&P (Signed)
Caroline Sauger, MD  Obstetrics and Gynecology    [] Hide copied text [] Hover for attribution information  Chief Complaint:    Ms. Sconyers is a 76 y.o. female here Fx D+C and H/S   Being followed for thickened endometrial stripe. Last check 06/2016- no PMB  U/s today shows stripe to be 7.9 mm and now a 5x3x5 mm polyp noted .  Unable to perform an EMBX in office to stenosis    Past Medical History:  has a past medical history of Allergic state; Anxiety and depression; Arthritis; Asthma in adult, unspecified; Cataract cortical, senile; Dyspepsia; Elevated cholesterol; GERD (gastroesophageal reflux disease); History of chickenpox; History of colorectal cancer; History of esophagitis; History of gastritis; History of Helicobacter pylori infection; History of hemorrhoids; History of hiatal hernia; History of peptic ulcer disease; History of shingles; adenomatous colonic polyps; Osteoporosis, post-menopausal; and Rectal cancer (CMS-HCC).  Past Surgical History:  has a past surgical history that includes Breast cyst excision (Left, 1999); Transcecal resection of rectal cancer (1999); egd (08/27/1998); lymph node from neck; Colon surgery; Lumpectomy, left breast; Colonoscopy (08/15/1997); Colonoscopy (10/19/2012); egd (10/19/2012); egd (02/04/2016); and Colonoscopy (02/04/2016). Family History: family history includes Allergic rhinitis in her father and mother; Asthma in her brother and son; Colon polyps in her father; Dementia in her father; High blood pressure (Hypertension) in her father, mother, and sister; Leukemia in her mother; Myocardial Infarction (Heart attack) in her father; Other in her brother; Thyroid disease in her sister. Social History:  reports that she has never smoked. She has never used smokeless tobacco. She reports that she does not drink alcohol. OB/GYN History:          OB History    Gravida Para Term Preterm AB Living   7 5 5     5    SAB TAB Ectopic Molar  Multiple Live Births                    Allergies: is allergic to biaxin [clarithromycin]; flagyl [metronidazole]; ibuprofen; lipitor  [atorvastatin]; other; penicillins; simvastatin; sulfa (sulfonamide antibiotics); and toviaz  [fesoterodine]. Medications:  Current Outpatient Prescriptions:  .  alendronate (FOSAMAX) 70 MG tablet, Take 70 mg by mouth once a week., Disp: , Rfl:  .  aspirin (ASPIRIN LOW DOSE) 81 MG EC tablet, Take 81 mg by mouth once daily., Disp: , Rfl:  .  calcium carbonate-vitamin D3 (CALCIUM 600 + D,3,) 600 mg(1,500mg ) -400 unit tablet, Take 1 tablet by mouth twice a day  [TAKE WITH A MEAL], Disp: , Rfl:  .  cholecalciferol (VITAMIN D3) 1,000 unit capsule, Take 1,000 Units by mouth once daily., Disp: , Rfl:  .  citalopram (CELEXA) 20 MG tablet, , Disp: , Rfl:  .  fexofenadine (ALLEGRA) 180 MG tablet, Take 180 mg by mouth once daily., Disp: , Rfl:  .  lovastatin (MEVACOR) 40 MG tablet, Take 10 mg by mouth nightly.  , Disp: , Rfl:  .  mirtazapine (REMERON) 15 MG tablet, Take by mouth., Disp: , Rfl:  .  omega-3 fatty acids-fish oil (FISH OIL) 360-1,200 mg CpDR, Take 1 capsule by mouth once daily.  , Disp: , Rfl:  .  pantoprazole (PROTONIX) 40 MG DR tablet, Take by mouth., Disp: , Rfl:  .  triamcinolone 0.1 % lotion, Apply to affected area 1-2 times weekly, Disp: 60 mL, Rfl: 3  Review of Systems: General:  No fatigue or weight loss Eyes:                           No vision changes Ears:                            No hearing difficulty Respiratory:                No cough or shortness of breath Pulmonary:                  No asthma or shortness of breath Cardiovascular:           No chest pain, palpitations, dyspnea on exertion Gastrointestinal:          No abdominal bloating, chronic diarrhea, constipations, masses, pain or hematochezia Genitourinary:             No hematuria, dysuria, abnormal vaginal discharge, pelvic pain,  Menometrorrhagia Lymphatic:                   No swollen lymph nodes Musculoskeletal:         No muscle weakness Neurologic:                  No extremity weakness, syncope, seizure disorder Psychiatric:                  No history of depression, delusions or suicidal/homicidal ideation    Exam:      Vitals:   01/28/17  BP: 129/66  Pulse: 63    Body mass index is 23.63 kg/m.  WDWN white/  female in NAD   Lungs: CTA  CV : RRR without murmur   Neck:  no thyromegaly Abdomen: soft , no mass, normal active bowel sounds,  non-tender, no rebound tenderness Pelvic: tanner stage 5 ,  External genitalia: vulva /labia no lesions Urethra: no prolapse Vagina: normal physiologic d/c Cervix: no lesions, no cervical motion tenderness   Uterus: normal size shape and contour, non-tender Adnexa: no mass,  non-tender     Impression:   The primary encounter diagnosis was Endometrial mass. A diagnosis of Thickened endometrium was also pertinent to this visit.  Increase in endometrial lining , now with new what appears to be polyp .  R/o hyperplastic polyp , cancer Plan:   Recommend Fx D+C  And removal of polyp       Burman [facility]  01/28/17

## 2017-01-31 ENCOUNTER — Telehealth: Payer: Self-pay | Admitting: Family Medicine

## 2017-01-31 NOTE — Telephone Encounter (Signed)
Left message regarding need to schedule AWV-ab

## 2017-02-01 ENCOUNTER — Encounter
Admission: RE | Admit: 2017-02-01 | Discharge: 2017-02-01 | Disposition: A | Discharge status: Home / Self Care | Payer: Medicare Other | Source: Ambulatory Visit | Attending: Obstetrics and Gynecology | Admitting: Obstetrics and Gynecology

## 2017-02-01 DIAGNOSIS — Z85048 Personal history of other malignant neoplasm of rectum, rectosigmoid junction, and anus: Secondary | ICD-10-CM | POA: Diagnosis not present

## 2017-02-01 DIAGNOSIS — Z01812 Encounter for preprocedural laboratory examination: Secondary | ICD-10-CM

## 2017-02-01 DIAGNOSIS — R300 Dysuria: Secondary | ICD-10-CM | POA: Diagnosis not present

## 2017-02-01 DIAGNOSIS — Z79899 Other long term (current) drug therapy: Secondary | ICD-10-CM | POA: Diagnosis not present

## 2017-02-01 DIAGNOSIS — Z01810 Encounter for preprocedural cardiovascular examination: Secondary | ICD-10-CM

## 2017-02-01 DIAGNOSIS — Z886 Allergy status to analgesic agent status: Secondary | ICD-10-CM | POA: Diagnosis not present

## 2017-02-01 DIAGNOSIS — N840 Polyp of corpus uteri: Secondary | ICD-10-CM | POA: Diagnosis not present

## 2017-02-01 DIAGNOSIS — J45909 Unspecified asthma, uncomplicated: Secondary | ICD-10-CM | POA: Diagnosis not present

## 2017-02-01 DIAGNOSIS — F419 Anxiety disorder, unspecified: Secondary | ICD-10-CM | POA: Diagnosis not present

## 2017-02-01 DIAGNOSIS — F329 Major depressive disorder, single episode, unspecified: Secondary | ICD-10-CM | POA: Diagnosis not present

## 2017-02-01 DIAGNOSIS — Z8711 Personal history of peptic ulcer disease: Secondary | ICD-10-CM | POA: Diagnosis not present

## 2017-02-01 DIAGNOSIS — E7800 Pure hypercholesterolemia, unspecified: Secondary | ICD-10-CM | POA: Diagnosis not present

## 2017-02-01 DIAGNOSIS — M1990 Unspecified osteoarthritis, unspecified site: Secondary | ICD-10-CM | POA: Diagnosis not present

## 2017-02-01 DIAGNOSIS — K219 Gastro-esophageal reflux disease without esophagitis: Secondary | ICD-10-CM | POA: Diagnosis not present

## 2017-02-01 DIAGNOSIS — Z880 Allergy status to penicillin: Secondary | ICD-10-CM | POA: Diagnosis not present

## 2017-02-01 DIAGNOSIS — K449 Diaphragmatic hernia without obstruction or gangrene: Secondary | ICD-10-CM | POA: Diagnosis not present

## 2017-02-01 DIAGNOSIS — E785 Hyperlipidemia, unspecified: Secondary | ICD-10-CM

## 2017-02-01 DIAGNOSIS — Z01818 Encounter for other preprocedural examination: Secondary | ICD-10-CM | POA: Diagnosis not present

## 2017-02-01 DIAGNOSIS — Z7982 Long term (current) use of aspirin: Secondary | ICD-10-CM | POA: Diagnosis not present

## 2017-02-01 LAB — CBC
HCT: 37.2 % (ref 35.0–47.0)
Hemoglobin: 12.7 g/dL (ref 12.0–16.0)
MCH: 32.6 pg (ref 26.0–34.0)
MCHC: 34.1 g/dL (ref 32.0–36.0)
MCV: 95.8 fL (ref 80.0–100.0)
PLATELETS: 151 10*3/uL (ref 150–440)
RBC: 3.88 MIL/uL (ref 3.80–5.20)
RDW: 12.2 % (ref 11.5–14.5)
WBC: 5.9 10*3/uL (ref 3.6–11.0)

## 2017-02-01 LAB — BASIC METABOLIC PANEL
Anion gap: 7 (ref 5–15)
BUN: 19 mg/dL (ref 6–20)
CALCIUM: 9.9 mg/dL (ref 8.9–10.3)
CHLORIDE: 104 mmol/L (ref 101–111)
CO2: 31 mmol/L (ref 22–32)
CREATININE: 0.81 mg/dL (ref 0.44–1.00)
GFR calc non Af Amer: >60 mL/min (ref >60)
Glucose, Bld: 85 mg/dL (ref 65–99)
Potassium: 3.9 mmol/L (ref 3.5–5.1)
SODIUM: 142 mmol/L (ref 135–145)

## 2017-02-01 LAB — TYPE AND SCREEN
ABO/RH(D): A NEG
Antibody Screen: NEGATIVE

## 2017-02-01 NOTE — Patient Instructions (Signed)
Your procedure is scheduled on: Friday 02/04/17 Report to [place]. 2ND FLOOR MEDICAL MALL ENTRANCE. To find out your arrival time please call [phone] between 1PM - 3PM on Thursday 02/03/17.  Remember: Instructions that are not followed completely may result in serious medical risk, up to and including death, or upon the discretion of your surgeon and anesthesiologist your surgery may need to be rescheduled.    __X__ 1. Do not eat food or drink liquids after midnight. No gum chewing or hard candies.     __X__ 2. No Alcohol for 24 hours before or after surgery.   ____ 3. Bring all medications with you on the day of surgery if instructed.    __X__ 4. Notify your doctor if there is any change in your medical condition     (cold, fever, infections).             __X___5. No smoking within 24 hours of your surgery.     Do not wear jewelry, make-up, hairpins, clips or nail polish.  Do not wear lotions, powders, or perfumes.   Do not shave 48 hours prior to surgery. Men may shave face and neck.  Do not bring valuables to the hospital.    [facility] is not responsible for any belongings or valuables.               Contacts, dentures or bridgework may not be worn into surgery.  Leave your suitcase in the car. After surgery it may be brought to your room.  For patients admitted to the hospital, discharge time is determined by your                treatment team.   Patients discharged the day of surgery will not be allowed to drive home.   Please read over the following fact sheets that you were given:   MRSA Information   __X__ Take these medicines the morning of surgery with A SIP OF WATER:    1. ZYRTEC  2. CITALOPRAM  3. LAMOTRIGINE  4. PANTOPRAZOLE  5.  6.  ____ Fleet Enema (as directed)   ____ Use CHG Soap as directed  ____ Use inhalers on the day of surgery  ____ Stop metformin 2 days prior to surgery    ____ Take 1/2 of usual insulin dose the night before surgery  and none on the morning of surgery.   __X__ Stop Coumadin/Plavix/aspirin on TODAY  __X__ Stop Anti-inflammatories such as Advil, Aleve, Ibuprofen, Motrin, Naproxen, Naprosyn, Goodies,powder, or aspirin products.  OK to take Tylenol.   __X__ Stop supplements until after surgery. (CRAN[place]ERRY, [place])   ____ Bring C-Pap to the hospital.

## 2017-02-03 MED ORDER — CEFOXITIN SODIUM-DEXTROSE 2-2.2 GM-% IV SOLR (PREMIX)
2.0000 g | INTRAVENOUS | Status: AC
  Administered 2017-02-04: 2 g via INTRAVENOUS

## 2017-02-04 ENCOUNTER — Ambulatory Visit: Payer: Medicare Other | Admitting: Anesthesiology

## 2017-02-04 ENCOUNTER — Encounter: Payer: Self-pay | Admitting: *Deleted

## 2017-02-04 ENCOUNTER — Encounter
Admission: RE | Disposition: A | Discharge status: Home / Self Care | Payer: Self-pay | Source: Ambulatory Visit | Attending: Obstetrics and Gynecology

## 2017-02-04 ENCOUNTER — Ambulatory Visit
Admission: RE | Admit: 2017-02-04 | Discharge: 2017-02-04 | Disposition: A | Discharge status: Home / Self Care | Payer: Medicare Other | Source: Ambulatory Visit | Attending: Obstetrics and Gynecology | Admitting: Obstetrics and Gynecology

## 2017-02-04 DIAGNOSIS — J45909 Unspecified asthma, uncomplicated: Secondary | ICD-10-CM | POA: Diagnosis not present

## 2017-02-04 DIAGNOSIS — E7800 Pure hypercholesterolemia, unspecified: Secondary | ICD-10-CM | POA: Insufficient documentation

## 2017-02-04 DIAGNOSIS — R938 Abnormal findings on diagnostic imaging of other specified body structures: Secondary | ICD-10-CM | POA: Diagnosis not present

## 2017-02-04 DIAGNOSIS — N857 Hematometra: Secondary | ICD-10-CM | POA: Diagnosis not present

## 2017-02-04 DIAGNOSIS — K219 Gastro-esophageal reflux disease without esophagitis: Secondary | ICD-10-CM | POA: Insufficient documentation

## 2017-02-04 DIAGNOSIS — N840 Polyp of corpus uteri: Secondary | ICD-10-CM | POA: Diagnosis not present

## 2017-02-04 DIAGNOSIS — Z880 Allergy status to penicillin: Secondary | ICD-10-CM | POA: Insufficient documentation

## 2017-02-04 DIAGNOSIS — F329 Major depressive disorder, single episode, unspecified: Secondary | ICD-10-CM | POA: Insufficient documentation

## 2017-02-04 DIAGNOSIS — Z7982 Long term (current) use of aspirin: Secondary | ICD-10-CM | POA: Insufficient documentation

## 2017-02-04 DIAGNOSIS — Z886 Allergy status to analgesic agent status: Secondary | ICD-10-CM | POA: Insufficient documentation

## 2017-02-04 DIAGNOSIS — F419 Anxiety disorder, unspecified: Secondary | ICD-10-CM | POA: Diagnosis not present

## 2017-02-04 DIAGNOSIS — K449 Diaphragmatic hernia without obstruction or gangrene: Secondary | ICD-10-CM | POA: Insufficient documentation

## 2017-02-04 DIAGNOSIS — Z85048 Personal history of other malignant neoplasm of rectum, rectosigmoid junction, and anus: Secondary | ICD-10-CM | POA: Insufficient documentation

## 2017-02-04 DIAGNOSIS — Z79899 Other long term (current) drug therapy: Secondary | ICD-10-CM | POA: Insufficient documentation

## 2017-02-04 DIAGNOSIS — M1990 Unspecified osteoarthritis, unspecified site: Secondary | ICD-10-CM | POA: Insufficient documentation

## 2017-02-04 DIAGNOSIS — Z8711 Personal history of peptic ulcer disease: Secondary | ICD-10-CM | POA: Diagnosis not present

## 2017-02-04 HISTORY — PX: HYSTEROSCOPY WITH D & C: SHX1775

## 2017-02-04 LAB — ABO/RH: ABO/RH(D): A NEG

## 2017-02-04 SURGERY — DILATATION AND CURETTAGE /HYSTEROSCOPY
Anesthesia: General | Site: Uterus | Wound class: Clean Contaminated

## 2017-02-04 MED ORDER — ALBUTEROL SULFATE HFA 108 (90 BASE) MCG/ACT IN AERS
INHALATION_SPRAY | RESPIRATORY_TRACT | Status: AC
  Filled 2017-02-04: qty 6.7

## 2017-02-04 MED ORDER — DEXAMETHASONE SODIUM PHOSPHATE 10 MG/ML IJ SOLN
INTRAMUSCULAR | Status: DC | PRN
  Administered 2017-02-04: 8 mg via INTRAVENOUS

## 2017-02-04 MED ORDER — PROPOFOL 10 MG/ML IV BOLUS
INTRAVENOUS | Status: DC | PRN
  Administered 2017-02-04: 130 mg via INTRAVENOUS

## 2017-02-04 MED ORDER — PROPOFOL 10 MG/ML IV BOLUS
INTRAVENOUS | Status: AC
  Filled 2017-02-04: qty 20

## 2017-02-04 MED ORDER — LIDOCAINE HCL (CARDIAC) 20 MG/ML IV SOLN
INTRAVENOUS | Status: DC | PRN
  Administered 2017-02-04: 40 mg via INTRAVENOUS

## 2017-02-04 MED ORDER — ONDANSETRON HCL 4 MG/2ML IJ SOLN
INTRAMUSCULAR | Status: DC | PRN
  Administered 2017-02-04: 4 mg via INTRAVENOUS

## 2017-02-04 MED ORDER — GLYCOPYRROLATE 0.2 MG/ML IJ SOLN
INTRAMUSCULAR | Status: DC | PRN
  Administered 2017-02-04: .2 mg via INTRAVENOUS

## 2017-02-04 MED ORDER — PROMETHAZINE HCL 25 MG/ML IJ SOLN: 6.2500 mg | INTRAMUSCULAR | Status: DC | PRN

## 2017-02-04 MED ORDER — OXYCODONE HCL 5 MG/5ML PO SOLN: 5.0000 mg | Freq: Once | ORAL | Status: DC | PRN

## 2017-02-04 MED ORDER — MIDAZOLAM HCL 2 MG/2ML IJ SOLN
INTRAMUSCULAR | Status: DC | PRN
  Administered 2017-02-04: .5 mg via INTRAVENOUS

## 2017-02-04 MED ORDER — CEFOXITIN SODIUM-DEXTROSE 2-2.2 GM-% IV SOLR (PREMIX)
INTRAVENOUS | Status: AC
  Filled 2017-02-04: qty 50

## 2017-02-04 MED ORDER — MIDAZOLAM HCL 2 MG/2ML IJ SOLN
INTRAMUSCULAR | Status: AC
  Filled 2017-02-04: qty 2

## 2017-02-04 MED ORDER — LIDOCAINE HCL (PF) 2 % IJ SOLN
INTRAMUSCULAR | Status: AC
  Filled 2017-02-04: qty 2

## 2017-02-04 MED ORDER — FENTANYL CITRATE (PF) 100 MCG/2ML IJ SOLN: 25.0000 ug | INTRAMUSCULAR | Status: DC | PRN

## 2017-02-04 MED ORDER — MEPERIDINE HCL 50 MG/ML IJ SOLN: 6.2500 mg | INTRAMUSCULAR | Status: DC | PRN

## 2017-02-04 MED ORDER — LACTATED RINGERS IV SOLN
INTRAVENOUS | Status: DC
  Administered 2017-02-04 (×2): via INTRAVENOUS

## 2017-02-04 MED ORDER — FENTANYL CITRATE (PF) 100 MCG/2ML IJ SOLN
INTRAMUSCULAR | Status: DC | PRN
  Administered 2017-02-04: 50 ug via INTRAVENOUS

## 2017-02-04 MED ORDER — FENTANYL CITRATE (PF) 100 MCG/2ML IJ SOLN
INTRAMUSCULAR | Status: AC
  Filled 2017-02-04: qty 2

## 2017-02-04 MED ORDER — OXYCODONE HCL 5 MG PO TABS: 5.0000 mg | ORAL_TABLET | Freq: Once | ORAL | Status: DC | PRN

## 2017-02-04 MED ORDER — SEVOFLURANE IN SOLN
RESPIRATORY_TRACT | Status: AC
  Filled 2017-02-04: qty 250

## 2017-02-04 MED ORDER — ALBUTEROL SULFATE HFA 108 (90 BASE) MCG/ACT IN AERS
INHALATION_SPRAY | RESPIRATORY_TRACT | Status: DC | PRN
  Administered 2017-02-04 (×2): 4 via RESPIRATORY_TRACT

## 2017-02-04 MED ORDER — GLYCOPYRROLATE 0.2 MG/ML IJ SOLN
INTRAMUSCULAR | Status: AC
  Filled 2017-02-04: qty 1

## 2017-02-04 SURGICAL SUPPLY — 17 items
CANISTER SUCT 3000ML PPV (MISCELLANEOUS) ×3 IMPLANT
CATH ROBINSON RED A/P 16FR (CATHETERS) ×3 IMPLANT
GLOVE BIO SURGEON STRL SZ7 (GLOVE) ×3 IMPLANT
GLOVE BIO SURGEON STRL SZ8 (GLOVE) ×3 IMPLANT
GOWN STRL REUS W/ TWL LRG LVL3 (GOWN DISPOSABLE) ×1 IMPLANT
GOWN STRL REUS W/ TWL XL LVL3 (GOWN DISPOSABLE) ×1 IMPLANT
GOWN STRL REUS W/TWL LRG LVL3 (GOWN DISPOSABLE) ×2
GOWN STRL REUS W/TWL XL LVL3 (GOWN DISPOSABLE) ×2
IV LACTATED RINGERS 1000ML (IV SOLUTION) ×3 IMPLANT
KIT RM TURNOVER CYSTO AR (KITS) ×3 IMPLANT
PACK DNC HYST (MISCELLANEOUS) ×3 IMPLANT
PAD OB MATERNITY 4.3X12.25 (PERSONAL CARE ITEMS) ×3 IMPLANT
PAD PREP 24X41 OB/GYN DISP (PERSONAL CARE ITEMS) ×3 IMPLANT
TOWEL OR 17X26 4PK STRL BLUE (TOWEL DISPOSABLE) ×3 IMPLANT
TUBING CONNECTING 10 (TUBING) ×4 IMPLANT
TUBING CONNECTING 10' (TUBING) ×2
TUBING HYSTEROSCOPY DOLPHIN (MISCELLANEOUS) ×3 IMPLANT

## 2017-02-04 NOTE — Brief Op Note (Signed)
02/04/2017  11:06 AM  PATIENT:  Ann Young  76 y.o. female  PRE-OPERATIVE DIAGNOSIS:  Endometrial Polyp  POST-OPERATIVE DIAGNOSIS:  Endometrial Polyp  PROCEDURE:  Procedure(s): DILATATION AND CURETTAGE /HYSTEROSCOPY (N/A)  SURGEON:  Surgeon(s) and Role:    * Bralee Feldt, Gwen Her, MD - Primary  PHYSICIAN ASSISTANT: Pa student Nocek   ASSISTANTS: none   ANESTHESIA:   lma  EBL:  Total I/O In: 200 [I.V.:200] Out: 100 [Urine:100] Min EBL  BLOOD ADMINISTERED:none  DRAINS: none   LOCAL MEDICATIONS USED:  NONE  SPECIMEN:  Source of Specimen:  ecc , endometrial curettings   DISPOSITION OF SPECIMEN:  PATHOLOGY  COUNTS:  YES  TOURNIQUET:  * No tourniquets in log *  DICTATION: .Other Dictation: Dictation Number verbal  PLAN OF CARE: Discharge to home after PACU  PATIENT DISPOSITION:  PACU - hemodynamically stable.   Delay start of Pharmacological VTE agent (>24hrs) due to surgical blood loss or risk of bleeding: not applicable

## 2017-02-04 NOTE — Progress Notes (Signed)
Pt ready for Fractional d+c  NPO  All questions answered

## 2017-02-04 NOTE — Discharge Instructions (Signed)

## 2017-02-04 NOTE — Anesthesia Preprocedure Evaluation (Signed)
Anesthesia Evaluation  Patient identified by MRN, date of birth, ID band Patient awake    Reviewed: Allergy & Precautions, NPO status , Patient's Chart, lab work & pertinent test results  History of Anesthesia Complications Negative for: history of anesthetic complications  Airway Mallampati: III  TM Distance: >3 FB Neck ROM: Full    Dental no notable dental hx.    Pulmonary asthma , neg sleep apnea,    breath sounds clear to auscultation- rhonchi (-) wheezing      Cardiovascular Exercise Tolerance: Good (-) hypertension(-) CAD, (-) Past MI and (-) Cardiac Stents  Rhythm:Regular Rate:Normal - Systolic murmurs and - Diastolic murmurs    Neuro/Psych PSYCHIATRIC DISORDERS Anxiety Depression negative neurological ROS     GI/Hepatic Neg liver ROS, hiatal hernia, PUD, GERD  Medicated,  Endo/Other  negative endocrine ROSneg diabetes  Renal/GU negative Renal ROS     Musculoskeletal  (+) Arthritis ,   Abdominal (+) - obese,   Peds  Hematology negative hematology ROS (+)   Anesthesia Other Findings Past Medical History: No date: Anxiety No date: Arthritis No date: Asthma No date: Cancer [facility])     Comment:  colorectal No date: Depression No date: Dyspepsia No date: Elevated cholesterol No date: GERD (gastroesophageal reflux disease) No date: History of hiatal hernia No date: Peptic ulcer disease   Reproductive/Obstetrics                             Anesthesia Physical Anesthesia Plan  ASA: II  Anesthesia Plan: General   Post-op Pain Management:    Induction: Intravenous  PONV Risk Score and Plan: 2 and Ondansetron and Dexamethasone  Airway Management Planned: LMA  Additional Equipment:   Intra-op Plan:   Post-operative Plan:   Informed Consent: I have reviewed the patients History and Physical, chart, labs and discussed the procedure including the risks, benefits and  alternatives for the proposed anesthesia with the patient or authorized representative who has indicated his/her understanding and acceptance.   Dental advisory given  Plan Discussed with: CRNA and Anesthesiologist  Anesthesia Plan Comments:         Anesthesia Quick Evaluation

## 2017-02-04 NOTE — Transfer of Care (Signed)
Immediate Anesthesia Transfer of Care Note  Patient: Ann Young  Procedure(s) Performed: Procedure(s): DILATATION AND CURETTAGE /HYSTEROSCOPY (N/A)  Patient Location: PACU  Anesthesia Type:General  Level of Consciousness: sedated  Airway & Oxygen Therapy: Patient Spontanous Breathing and Patient connected to face mask oxygen  Post-op Assessment: Report given to RN and Post -op Vital signs reviewed and stable  Post vital signs: Reviewed and stable  Last Vitals:  Vitals:   02/04/17 0856 02/04/17 1113  BP: 123/66 131/75  Pulse: 68 (!) 104  Resp: 16 16  Temp: 36.9 C 36.7 C  SpO2: 98% 99%    Last Pain:  Vitals:   02/04/17 0856  TempSrc: Oral  PainSc: 3          Complications: No apparent anesthesia complications

## 2017-02-04 NOTE — Anesthesia Post-op Follow-up Note (Signed)
Anesthesia QCDR form completed.        

## 2017-02-04 NOTE — OR Nursing (Signed)
Lab tech in for abo/rh draw @ [phone]

## 2017-02-04 NOTE — Anesthesia Procedure Notes (Signed)
Procedure Name: LMA Insertion Date/Time: 02/04/2017 10:20 AM Performed by: Allean Found Pre-anesthesia Checklist: Patient identified, Emergency Drugs available, Suction available, Patient being monitored and Timeout performed Patient Re-evaluated:Patient Re-evaluated prior to induction Oxygen Delivery Method: Circle system utilized Preoxygenation: Pre-oxygenation with 100% oxygen Induction Type: IV induction Ventilation: Mask ventilation without difficulty LMA: LMA inserted LMA Size: 4.0 Number of attempts: 1 Placement Confirmation: ETT inserted through vocal cords under direct vision,  positive ETCO2 and breath sounds checked- equal and bilateral Tube secured with: Tape Dental Injury: Teeth and Oropharynx as per pre-operative assessment

## 2017-02-06 ENCOUNTER — Encounter: Payer: Self-pay | Admitting: Obstetrics and Gynecology

## 2017-02-07 ENCOUNTER — Encounter: Payer: Self-pay | Admitting: Obstetrics and Gynecology

## 2017-02-07 LAB — SURGICAL PATHOLOGY

## 2017-02-07 NOTE — Op Note (Signed)
NAME:  COLLIN, HENDLEY NO.:  [id number]  MEDICAL RECORD NO.:  [id number]  LOCATION:                                 FACILITY:  PHYSICIAN:  Laverta Baltimore, MD     DATE OF BIRTH:  DATE OF PROCEDURE: DATE OF DISCHARGE:                              OPERATIVE REPORT   PREOPERATIVE DIAGNOSIS: 1. Thickened endometrial stripe. 2. Possible endometrial polyp.  POSTOPERATIVE DIAGNOSIS: 1. Thickened endometrial stripe. 2. Hematometra.  PROCEDURE PERFORMED: 1. Fractional dilation and curettage. 2. Hysteroscopy.  SURGEON:  Laverta Baltimore, MD  ANESTHESIA:  LMA.  SURGEON:  Laverta Baltimore, MD.  FIRST ASSISTANT:  PA student, Nocek.  INDICATION:  A 76 year old female underwent a CT scan that showed a thickened endometrial stripe.  A transvaginal ultrasound showed the endometrial stripe to be 7 mm and a possible 5 x 4 mm endometrial polyp.  DESCRIPTION OF PROCEDURE:  After adequate LMA anesthesia, the patient was placed in dorsal supine position with legs in the candy-cane stirrups.  The patient did receive 2 g of cefoxitin prior to commencement of the case.  The patient's lower abdomen, perineum, and vagina were prepped and draped in normal sterile fashion.  Time-out was performed.  A red Robinson catheter to drain the bladder, 150 mL clear urine.  A weighted speculum was placed in the posterior vaginal vault. The anterior cervix was grasped with a single-tooth tenaculum.  A pinpoint endocervical canal was identified.  This was opened with an #11 blade scalpel and an endocervical curettage was performed with scant tissue resulted.  There were some mahogany colored old blood resulted with the dilation portion of the procedure.  Cervix was dilated to a #17 Hanks dilator.  The hysteroscope was advanced into the endometrial cavity.  Normal saline was used as distending medium.  There were no identifiable polyps.  The hysteroscope was removed and the  endometrial curettage was performed with good uterine cry and scant tissue as well. The procedure was terminated.  Single-tooth tenaculum was removed from the anterior cervix.  ESTIMATED BLOOD LOSS:  Minimal.  INTRAOPERATIVE FLUIDS:  400 mL.  URINE OUTPUT:  150 mL, net deficit of normal saline 10 mL.  The patient tolerated the procedure well and was taken to recovery room in good condition.    ______________________________ Laverta Baltimore, MD   ______________________________ Laverta Baltimore, MD    TS/MEDQ  D:  02/04/2017  T:  02/04/2017  Job:  [id number]

## 2017-02-08 DIAGNOSIS — L720 Epidermal cyst: Secondary | ICD-10-CM | POA: Diagnosis not present

## 2017-02-08 DIAGNOSIS — D692 Other nonthrombocytopenic purpura: Secondary | ICD-10-CM | POA: Diagnosis not present

## 2017-02-08 DIAGNOSIS — L578 Other skin changes due to chronic exposure to nonionizing radiation: Secondary | ICD-10-CM | POA: Diagnosis not present

## 2017-02-08 DIAGNOSIS — I8393 Asymptomatic varicose veins of bilateral lower extremities: Secondary | ICD-10-CM | POA: Diagnosis not present

## 2017-02-08 DIAGNOSIS — L820 Inflamed seborrheic keratosis: Secondary | ICD-10-CM | POA: Diagnosis not present

## 2017-02-08 DIAGNOSIS — D229 Melanocytic nevi, unspecified: Secondary | ICD-10-CM | POA: Diagnosis not present

## 2017-02-08 DIAGNOSIS — L821 Other seborrheic keratosis: Secondary | ICD-10-CM | POA: Diagnosis not present

## 2017-02-08 DIAGNOSIS — L814 Other melanin hyperpigmentation: Secondary | ICD-10-CM | POA: Diagnosis not present

## 2017-02-08 DIAGNOSIS — I781 Nevus, non-neoplastic: Secondary | ICD-10-CM | POA: Diagnosis not present

## 2017-02-08 NOTE — Anesthesia Postprocedure Evaluation (Signed)
Anesthesia Post Note  Patient: Ann Young  Procedure(s) Performed: Procedure(s) (LRB): DILATATION AND CURETTAGE /HYSTEROSCOPY (N/A)  Patient location during evaluation: PACU Anesthesia Type: General Level of consciousness: awake and alert and oriented Pain management: pain level controlled Vital Signs Assessment: post-procedure vital signs reviewed and stable Respiratory status: spontaneous breathing, nonlabored ventilation and respiratory function stable Cardiovascular status: blood pressure returned to baseline and stable Postop Assessment: no signs of nausea or vomiting Anesthetic complications: no     Last Vitals:  Vitals:   02/04/17 1200 02/04/17 1221  BP: 124/75 125/71  Pulse: 97 87  Resp: (!) 24 16  Temp: 36.7 C (!) 35.9 C  SpO2: 96% 99%    Last Pain:  Vitals:   02/04/17 1221  TempSrc: Tympanic  PainSc: 4                  Jontrell Bushong

## 2017-03-11 DIAGNOSIS — M79674 Pain in right toe(s): Secondary | ICD-10-CM | POA: Diagnosis not present

## 2017-03-11 DIAGNOSIS — L600 Ingrowing nail: Secondary | ICD-10-CM | POA: Diagnosis not present

## 2017-03-11 DIAGNOSIS — M79675 Pain in left toe(s): Secondary | ICD-10-CM | POA: Diagnosis not present

## 2017-03-11 DIAGNOSIS — B351 Tinea unguium: Secondary | ICD-10-CM | POA: Diagnosis not present

## 2017-03-11 DIAGNOSIS — M2012 Hallux valgus (acquired), left foot: Secondary | ICD-10-CM | POA: Diagnosis not present

## 2017-03-16 DIAGNOSIS — R9389 Abnormal findings on diagnostic imaging of other specified body structures: Secondary | ICD-10-CM | POA: Diagnosis not present

## 2017-03-21 DIAGNOSIS — Z23 Encounter for immunization: Secondary | ICD-10-CM | POA: Diagnosis not present

## 2017-03-21 DIAGNOSIS — H900 Conductive hearing loss, bilateral: Secondary | ICD-10-CM | POA: Diagnosis not present

## 2017-03-21 DIAGNOSIS — H6123 Impacted cerumen, bilateral: Secondary | ICD-10-CM | POA: Diagnosis not present

## 2017-04-01 DIAGNOSIS — F332 Major depressive disorder, recurrent severe without psychotic features: Secondary | ICD-10-CM | POA: Diagnosis not present

## 2017-04-01 DIAGNOSIS — F410 Panic disorder [episodic paroxysmal anxiety] without agoraphobia: Secondary | ICD-10-CM | POA: Diagnosis not present

## 2017-04-01 DIAGNOSIS — G4700 Insomnia, unspecified: Secondary | ICD-10-CM | POA: Diagnosis not present

## 2017-04-01 DIAGNOSIS — F411 Generalized anxiety disorder: Secondary | ICD-10-CM | POA: Diagnosis not present

## 2017-04-12 ENCOUNTER — Telehealth: Payer: Self-pay | Admitting: Family Medicine

## 2017-04-25 DIAGNOSIS — R0789 Other chest pain: Secondary | ICD-10-CM | POA: Diagnosis not present

## 2017-04-25 DIAGNOSIS — K210 Gastro-esophageal reflux disease with esophagitis: Secondary | ICD-10-CM | POA: Diagnosis not present

## 2017-04-25 DIAGNOSIS — E782 Mixed hyperlipidemia: Secondary | ICD-10-CM | POA: Diagnosis not present

## 2017-04-27 ENCOUNTER — Telehealth: Payer: Self-pay | Admitting: Family Medicine

## 2017-05-17 DIAGNOSIS — D692 Other nonthrombocytopenic purpura: Secondary | ICD-10-CM | POA: Diagnosis not present

## 2017-05-17 DIAGNOSIS — L821 Other seborrheic keratosis: Secondary | ICD-10-CM | POA: Diagnosis not present

## 2017-05-17 DIAGNOSIS — L820 Inflamed seborrheic keratosis: Secondary | ICD-10-CM | POA: Diagnosis not present

## 2017-05-17 DIAGNOSIS — L720 Epidermal cyst: Secondary | ICD-10-CM | POA: Diagnosis not present

## 2017-05-17 DIAGNOSIS — L814 Other melanin hyperpigmentation: Secondary | ICD-10-CM | POA: Diagnosis not present

## 2017-06-02 NOTE — Telephone Encounter (Signed)
duplicate

## 2017-06-20 DIAGNOSIS — F332 Major depressive disorder, recurrent severe without psychotic features: Secondary | ICD-10-CM | POA: Diagnosis not present

## 2017-06-20 DIAGNOSIS — F411 Generalized anxiety disorder: Secondary | ICD-10-CM | POA: Diagnosis not present

## 2017-06-20 DIAGNOSIS — G4700 Insomnia, unspecified: Secondary | ICD-10-CM | POA: Diagnosis not present

## 2017-06-20 DIAGNOSIS — F410 Panic disorder [episodic paroxysmal anxiety] without agoraphobia: Secondary | ICD-10-CM | POA: Diagnosis not present

## 2017-06-29 DIAGNOSIS — R9389 Abnormal findings on diagnostic imaging of other specified body structures: Secondary | ICD-10-CM | POA: Diagnosis not present

## 2017-06-30 ENCOUNTER — Telehealth: Payer: Self-pay | Admitting: Family Medicine

## 2017-06-30 NOTE — Telephone Encounter (Signed)
No callback rec to date

## 2017-07-01 ENCOUNTER — Other Ambulatory Visit: Payer: Self-pay | Admitting: Family Medicine

## 2017-07-01 DIAGNOSIS — Z1231 Encounter for screening mammogram for malignant neoplasm of breast: Secondary | ICD-10-CM

## 2017-07-04 ENCOUNTER — Encounter: Payer: Self-pay | Admitting: Family Medicine

## 2017-07-04 ENCOUNTER — Ambulatory Visit (INDEPENDENT_AMBULATORY_CARE_PROVIDER_SITE_OTHER): Payer: Medicare Other | Admitting: Family Medicine

## 2017-07-04 VITALS — BP 106/58 | HR 64 | Temp 98.3°F | Ht 60.0 in | Wt 127.4 lb

## 2017-07-04 DIAGNOSIS — E782 Mixed hyperlipidemia: Secondary | ICD-10-CM | POA: Diagnosis not present

## 2017-07-04 DIAGNOSIS — K2100 Gastro-esophageal reflux disease with esophagitis, without bleeding: Secondary | ICD-10-CM

## 2017-07-04 DIAGNOSIS — Z85038 Personal history of other malignant neoplasm of large intestine: Secondary | ICD-10-CM

## 2017-07-04 DIAGNOSIS — R9389 Abnormal findings on diagnostic imaging of other specified body structures: Secondary | ICD-10-CM | POA: Insufficient documentation

## 2017-07-04 DIAGNOSIS — M810 Age-related osteoporosis without current pathological fracture: Secondary | ICD-10-CM

## 2017-07-04 DIAGNOSIS — K317 Polyp of stomach and duodenum: Secondary | ICD-10-CM

## 2017-07-04 DIAGNOSIS — Z23 Encounter for immunization: Secondary | ICD-10-CM

## 2017-07-04 DIAGNOSIS — Z0000 Encounter for general adult medical examination without abnormal findings: Secondary | ICD-10-CM

## 2017-07-04 DIAGNOSIS — F418 Other specified anxiety disorders: Secondary | ICD-10-CM | POA: Diagnosis not present

## 2017-07-04 MED ORDER — PANTOPRAZOLE SODIUM 40 MG PO TBEC: 40.0000 mg | DELAYED_RELEASE_TABLET | Freq: Every day | ORAL | 1 refills | Status: DC

## 2017-07-04 NOTE — Progress Notes (Signed)
Patient: Ann Young, Female    DOB: 05-07-1941, 77 y.o.   MRN: [id number] Visit Date: 07/04/2017  Today's Provider: Vernie Murders, PA   Chief Complaint  Patient presents with  . Medicare Wellness   Subjective:    Annual wellness visit Ann Young is a 77 y.o. female who presents today for her Subsequent Annual Wellness Visit. She feels well. She reports exercising none. She reports she is sleeping well.  -----------------------------------------------------------  Review of Systems  Constitutional: Negative.   HENT: Positive for rhinorrhea.   Eyes: Positive for itching.  Respiratory: Negative.   Cardiovascular: Negative.   Gastrointestinal: Positive for constipation.  Endocrine: Negative.   Genitourinary: Negative.   Musculoskeletal: Positive for back pain.  Skin: Negative.   Allergic/Immunologic: Negative.   Neurological: Negative.   Hematological: Negative.   Psychiatric/Behavioral: The patient is nervous/anxious.     Social History   Socioeconomic History  . Marital status: Widowed    Spouse name: Not on file  . Number of children: 5  . Years of education: Not on file  . Highest education level: Not on file  Social Needs  . Financial resource strain: Not on file  . Food insecurity - worry: Not on file  . Food insecurity - inability: Not on file  . Transportation needs - medical: Not on file  . Transportation needs - non-medical: Not on file  Occupational History  . Not on file  Tobacco Use  . Smoking status: Never Smoker  . Smokeless tobacco: Never Used  Substance and Sexual Activity  . Alcohol use: No    Alcohol/week: 0.0 oz  . Drug use: No  . Sexual activity: No  Other Topics Concern  . Not on file  Social History Narrative   Lives alone with her dogs. One son lives next door and one within a few miles.        Patient Active Problem List   Diagnosis Date Noted  . Thickened endometrium 07/04/2017  . Ketonuria 09/03/2016  . Chronic  reflux esophagitis 07/20/2016  . Multiple gastric polyps 07/20/2016  . History of allergy 08/18/2015  . Osteoporosis 05/07/2015  . Constipation, chronic 05/07/2015  . History of colorectal cancer 05/07/2015  . Depression with anxiety 05/07/2015  . Arthritis 05/07/2015  . Mixed hyperlipidemia 05/07/2015  . GERD with stricture 05/07/2015  . Low back pain 02/10/2015  . Problems influencing health status 10/28/2014  . Malignant neoplasm ([place]) 10/28/2014  . History of colonic polyps 10/28/2014  . Anxiety and depression 10/28/2014  . Acid reflux 10/28/2014  . History of malignant neoplasm of large intestine 10/28/2014  . 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 Gastric ulcer 10/28/2014  . 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 Awareness of heartbeats 03/25/2014    Past Surgical History:  Procedure Laterality Date  . BREAST BIOPSY Left 2000   benign  . CARDIAC CATHETERIZATION  2015   WNL  . CATARACT EXTRACTION Bilateral 2013  . CATARACT EXTRACTION, BILATERAL Bilateral 2013   Dr. Willy Eddy  . COLON SURGERY    . COLONOSCOPY WITH PROPOFOL N/A 02/04/2016   Procedure: COLONOSCOPY WITH PROPOFOL;  Surgeon: Manya Silvas, MD;  Location: [facility] ENDOSCOPY;  Service: Endoscopy;  Laterality: N/A;  . ESOPHAGOGASTRODUODENOSCOPY (EGD) WITH PROPOFOL N/A 02/04/2016   Procedure: ESOPHAGOGASTRODUODENOSCOPY (EGD) WITH PROPOFOL;  Surgeon: Manya Silvas, MD;  Location: [facility] ENDOSCOPY;  Service: Endoscopy;  Laterality: N/A;  . HYSTEROSCOPY W/D&C N/A 02/04/2017   Procedure: DILATATION AND CURETTAGE /HYSTEROSCOPY;  Surgeon: Schermerhorn,  Gwen Her, MD;  Location: [facility] ORS;  Service: Gynecology;  Laterality: N/A;  . resection of colorectal cancer without sequela  1999  . resection of colorectal cancer without sequela    . TUBAL LIGATION  1976    Her family history includes Bipolar disorder in her daughter; Cirrhosis in her sister; Colon polyps in her sister;  Depression in her father and mother; Heart disease in her father; Leukemia in her mother.     Previous Medications   ALENDRONATE (FOSAMAX) 70 MG TABLET    Take 1 tablet (70 mg total) by mouth once a week. Reported on 08/18/2015   ASPIRIN 81 MG TABLET    Take 81 mg by mouth daily.   CALCIUM CARB-CHOLECALCIFEROL 600-200 MG-UNIT TABS    Take 1 tablet by mouth 2 (two) times daily.   CETIRIZINE (ZYRTEC) 10 MG TABLET    TAKE ONE TABLET BY MOUTH ONCE DAILY FOR ALLERGIES   CHOLECALCIFEROL (VITAMIN D PO)    Take by mouth.   CITALOPRAM (CELEXA) 40 MG TABLET    Take 40 mg by mouth every morning.    CRANBERRY 125 MG TABS    Take 1 capsule by mouth daily.   CRANBERRY PO    Take by mouth.   FLUTICASONE (FLONASE) 50 MCG/ACT NASAL SPRAY    Place 1 spray into both nostrils daily.    LAMOTRIGINE (LAMICTAL) 100 MG TABLET       LOVASTATIN (MEVACOR) 40 MG TABLET    Take 1 tablet (40 mg total) by mouth at bedtime.   MIRTAZAPINE (REMERON) 30 MG TABLET    Take 30 mg by mouth at bedtime.    MULTIPLE VITAMINS-MINERALS (MULTIVITAMIN PO)    Take 1 tablet by mouth daily.    OMEGA-3 FATTY ACIDS (FISH OIL PO)    Take 1 capsule by mouth daily.    OMEPRAZOLE (PRILOSEC) 20 MG CAPSULE    Take 20 mg by mouth at bedtime.   PANTOPRAZOLE (PROTONIX) 40 MG TABLET    Take 1 tablet (40 mg total) by mouth daily.   PROBIOTIC PRODUCT (PROBIOTIC-10 PO)    Take 1 tablet by mouth daily.   TRIAMCINOLONE LOTION (KENALOG) 0.1 %    Apply 1 application topically daily as needed.     Patient Care Team: Sally Menard, Vickki Muff, PA as PCP - General (Family Medicine) Breeonna Mone, Vickki Muff, PA (Physician Assistant)      Objective:   Vitals: BP (!) 106/58 (BP Location: Right Arm, Patient Position: Sitting, Cuff Size: Normal)   Pulse 64   Temp 98.3 F (36.8 C) (Oral)   Ht 5' (1.524 m)   Wt 127 lb 6.4 oz (57.8 kg)   SpO2 95%   BMI 24.88 kg/m  Wt Readings from Last 3 Encounters:  07/04/17 127 lb 6.4 oz (57.8 kg)  02/04/17 122 lb (55.3 kg)   02/01/17 122 lb 12.8 oz (55.7 kg)   Physical Exam  Constitutional: She is oriented to person, place, and time. She appears well-developed and well-nourished.  HENT:  Head: Normocephalic and atraumatic.  Right Ear: External ear normal.  Left Ear: External ear normal.  Nose: Nose normal.  Mouth/Throat: Oropharynx is clear and moist.  Eyes: Conjunctivae and EOM are normal. Pupils are equal, round, and reactive to light. Right eye exhibits no discharge.  Neck: Normal range of motion. Neck supple. No tracheal deviation present. No thyromegaly present.  Cardiovascular: Normal rate, regular rhythm, normal heart sounds and intact distal pulses.  No murmur heard. Pulmonary/Chest: Effort normal and  breath sounds normal. No respiratory distress. She has no wheezes. She has no rales. She exhibits no tenderness.  Abdominal: Soft. She exhibits no distension and no mass. There is no tenderness. There is no rebound and no guarding.  Genitourinary:  Genitourinary Comments: Deferred to Dr. Ouida Sills (GYN).  Musculoskeletal: Normal range of motion. She exhibits no edema or tenderness.  Lymphadenopathy:    She has no cervical adenopathy.  Neurological: She is alert and oriented to person, place, and time. She has normal reflexes. No cranial nerve deficit. She exhibits normal muscle tone. Coordination normal.  Skin: Skin is warm and dry. No rash noted. No erythema.  Psychiatric: Judgment and thought content normal. Her mood appears anxious. She is agitated and withdrawn. She exhibits a depressed mood. She expresses no suicidal plans and no homicidal plans.    Activities of Daily Living In your present state of health, do you have any difficulty performing the following activities: 07/04/2017 02/01/2017  Hearing? N -  Vision? N -  Difficulty concentrating or making decisions? N -  Walking or climbing stairs? N N  Dressing or bathing? N -  Doing errands, shopping? N N  Some recent data might be hidden     Fall Risk Assessment Fall Risk  07/04/2017 08/13/2016  Falls in the past year? No No     Depression Screen PHQ 2/9 Scores 07/04/2017 01/17/2017 08/13/2016 08/13/2016  PHQ - 2 Score 4 2 0 0  PHQ- 9 Score 9 4 - 5    Cognitive Testing - 6-CIT  Correct? Score   What year is it? yes 0 0 or 4  What month is it? yes 0 0 or 3  Memorize:    Pia Mau,  44,  [address]  [place],      What time is it? (within 1 hour) yes 0 0 or 3  Count backwards from 20 yes 0 0, 2, or 4  Name the months of the year yes 0 0, 2, or 4  Repeat name & address above yes 6 0, 2, 4, 6, 8, or 10       TOTAL SCORE  6/28   Interpretation:  Normal  Normal (0-7) Abnormal (8-28)     Assessment & Plan:     Annual Wellness Visit  Reviewed patient's Family Medical History Reviewed and updated list of patient's medical providers Assessment of cognitive impairment was done Assessed patient's functional ability Established a written schedule for health screening [place] Completed and Reviewed  Exercise Activities and Dietary recommendations Goals    Encouraged to follow low fat diet and exercise 3-4 days a week for 20-30 minutes by walking.      Immunization History  Administered Date(s) Administered  . Influenza, High Dose Seasonal PF 03/08/2016  . Influenza-Unspecified 03/20/2015, 03/14/2017  . Tdap 08/18/2014    Health Maintenance  Topic Date Due  . PNA vac Low Risk Adult (1 of 2 - PCV13) 11/23/2007  . MAMMOGRAM  07/20/2018  . COLONOSCOPY  02/03/2021  . TETANUS/TDAP  08/17/2024  . INFLUENZA VACCINE  Completed  . DEXA SCAN  Completed    Discussed health benefits of physical activity, and encouraged her to engage in regular exercise appropriate for her age and condition.    ------------------------------------------------------------------------------------------------------------ 1. Encounter for Medicare annual wellness exam General health good with chronic depression with  anxiety disorder followed by psychiatrist (Dr. Nicolasa Ducking). Will up date pneumonia vaccination and get routine follow up labs. Given anticipatory counseling.  2. Osteoporosis,  unspecified osteoporosis type, unspecified pathological fracture presence Tolerating Alendronate 70 mg q week and taking calcium with vitamin-D daily. Last BMD was 04-29-14. Recheck labs and continue present regimen. - CBC with Differential/Platelet - Comprehensive metabolic panel  3. History of malignant neoplasm of large intestine Had resection in 1999 at [facility]. Dr. Vira Agar (GI) has followed with colonoscopy every 3 years without recurrence noted in 2017. Denies rectal bleeding or melena.  4. Mixed hyperlipidemia Trying to follow low fat diet and still taking Lovastatin 40 mg qd without side effects. Recheck CMP, lipid panel and TSH. Continue present regimen and try to walk for exercise. - Comprehensive metabolic panel - Lipid panel - TSH  5. Chronic reflux esophagitis Documented on upper endoscopy 02-04-16 by Dr. Vira Agar (GI). He advised she take the Protonix daily. - CBC with Differential/Platelet - pantoprazole (PROTONIX) 40 MG tablet; Take 1 tablet (40 mg total) by mouth daily.  Dispense: 90 tablet; Refill: 1  6. Depression with anxiety History of anxiety, panic and major depression followed by psychiatrist (Dr. Nicolasa Ducking) regularly. Presently on Remeron, Citalopram, and Lamictal. Recheck routine labs and encouraged to continue follow up with psychiatrist. - Comprehensive metabolic panel - TSH  7. Need for Streptococcus pneumoniae vaccination -Pneumococcal conjugate vaccine 13-valent IM  8. Multiple gastric polyps Documented on upper endoscopy by Dr. Vira Agar and advised to treat with Pantoprazole daily with chronic reflux esophagitis, also. - pantoprazole (PROTONIX) 40 MG tablet; Take 1 tablet (40 mg total) by mouth daily.  Dispense: 90 tablet; Refill: 1

## 2017-07-05 LAB — CBC WITH DIFFERENTIAL/PLATELET
BASOS ABS: 0 10*3/uL (ref 0.0–0.2)
BASOS: 1 %
EOS (ABSOLUTE): 0.1 10*3/uL (ref 0.0–0.4)
Eos: 2 %
Hematocrit: 38.6 % (ref 34.0–46.6)
Hemoglobin: 13.1 g/dL (ref 11.1–15.9)
Immature Grans (Abs): 0 10*3/uL (ref 0.0–0.1)
Immature Granulocytes: 0 %
Lymphocytes Absolute: 1.6 10*3/uL (ref 0.7–3.1)
Lymphs: 33 %
MCH: 31.6 pg (ref 26.6–33.0)
MCHC: 33.9 g/dL (ref 31.5–35.7)
MCV: 93 fL (ref 79–97)
MONOS ABS: 0.4 10*3/uL (ref 0.1–0.9)
Monocytes: 8 %
NEUTROS ABS: 2.7 10*3/uL (ref 1.4–7.0)
Neutrophils: 56 %
PLATELETS: 178 10*3/uL (ref 150–379)
RBC: 4.15 x10E6/uL (ref 3.77–5.28)
RDW: 12.6 % (ref 12.3–15.4)
WBC: 4.8 10*3/uL (ref 3.4–10.8)

## 2017-07-05 LAB — COMPREHENSIVE METABOLIC PANEL
A/G RATIO: 1.9 (ref 1.2–2.2)
ALK PHOS: 50 IU/L (ref 39–117)
ALT: 18 IU/L (ref 0–32)
AST: 21 IU/L (ref 0–40)
Albumin: 4.6 g/dL (ref 3.5–4.8)
BILIRUBIN TOTAL: 0.2 mg/dL (ref 0.0–1.2)
BUN/Creatinine Ratio: 18 (ref 12–28)
BUN: 16 mg/dL (ref 8–27)
CHLORIDE: 102 mmol/L (ref 96–106)
CO2: 28 mmol/L (ref 20–29)
Calcium: 9.2 mg/dL (ref 8.7–10.3)
Creatinine, Ser: 0.9 mg/dL (ref 0.57–1.00)
GFR calc Af Amer: 73 mL/min/{1.73_m2} (ref >59)
GFR calc non Af Amer: 63 mL/min/{1.73_m2} (ref >59)
Globulin, Total: 2.4 g/dL (ref 1.5–4.5)
Glucose: 92 mg/dL (ref 65–99)
POTASSIUM: 4.1 mmol/L (ref 3.5–5.2)
Sodium: 144 mmol/L (ref 134–144)
Total Protein: 7 g/dL (ref 6.0–8.5)

## 2017-07-05 LAB — LIPID PANEL
CHOLESTEROL TOTAL: 222 mg/dL — AB (ref 100–199)
Chol/HDL Ratio: 3 ratio (ref 0.0–4.4)
HDL: 73 mg/dL (ref >39)
LDL Calculated: 133 mg/dL — ABNORMAL HIGH (ref 0–99)
TRIGLYCERIDES: 80 mg/dL (ref 0–149)
VLDL Cholesterol Cal: 16 mg/dL (ref 5–40)

## 2017-07-05 LAB — TSH: TSH: 2.28 u[IU]/mL (ref 0.450–4.500)

## 2017-07-06 NOTE — Progress Notes (Signed)
Advised  ED 

## 2017-07-12 NOTE — Telephone Encounter (Signed)
[place] completed

## 2017-07-19 ENCOUNTER — Ambulatory Visit (INDEPENDENT_AMBULATORY_CARE_PROVIDER_SITE_OTHER): Payer: Medicare Other | Admitting: Family Medicine

## 2017-07-19 ENCOUNTER — Encounter: Payer: Self-pay | Admitting: Family Medicine

## 2017-07-19 VITALS — BP 106/70 | HR 92 | Temp 99.9°F | Resp 15 | Wt 129.2 lb

## 2017-07-19 DIAGNOSIS — J069 Acute upper respiratory infection, unspecified: Secondary | ICD-10-CM

## 2017-07-19 NOTE — Patient Instructions (Addendum)
Discussed use of  Mucinex D for congestion and Delsym for cough. Let us know if not feeling better on Friday.

## 2017-07-19 NOTE — Progress Notes (Signed)
Subjective:     Patient ID: Ann Young, female   DOB: 16-Dec-1940, 77 y.o.   MRN: [id number] Chief Complaint  Patient presents with  . Fever    Patient comes in office today with complaints of fever high of 100.1 , sore throat and productive cough with yellow like phlegm. Patient reports the following symptoms associated; nausea, sinus pain and pressure, congestion in right ear , watery eyes, runny nose and back pain. Patient has tried otc Day/Nyquil.    HPI Reports sx onset on 2/2. + flu shot  Review of Systems     Objective:   Physical Exam  Constitutional: She appears well-developed and well-nourished. No distress.  Ears: T.M's intact without inflammation Throat: no tonsillar enlargement or exudate Neck: no cervical adenopathy Lungs: clear     Assessment:    1. Viral upper respiratory tract infection    Plan:    Willl have her switch to Mucinex D, Delsym samples x 60 ml. Provided. Phone f/u on 07/22/17.

## 2017-07-22 ENCOUNTER — Telehealth: Payer: Self-pay | Admitting: Family Medicine

## 2017-07-22 NOTE — Telephone Encounter (Signed)
Called her about recent office visit for URI. States she is afebrile with clear sinus congestion, chest congestion with cough. Discussed continuing symptomatic treatment.

## 2017-07-25 ENCOUNTER — Ambulatory Visit
Admission: RE | Admit: 2017-07-25 | Discharge: 2017-07-25 | Disposition: A | Discharge status: Home / Self Care | Payer: Medicare Other | Source: Ambulatory Visit | Attending: Family Medicine | Admitting: Family Medicine

## 2017-07-25 ENCOUNTER — Ambulatory Visit (INDEPENDENT_AMBULATORY_CARE_PROVIDER_SITE_OTHER): Payer: Medicare Other | Admitting: Family Medicine

## 2017-07-25 ENCOUNTER — Encounter: Payer: Self-pay | Admitting: Family Medicine

## 2017-07-25 VITALS — BP 90/60 | HR 80 | Temp 98.2°F | Resp 16 | Wt 125.8 lb

## 2017-07-25 DIAGNOSIS — J984 Other disorders of lung: Secondary | ICD-10-CM | POA: Diagnosis not present

## 2017-07-25 DIAGNOSIS — R0989 Other specified symptoms and signs involving the circulatory and respiratory systems: Secondary | ICD-10-CM

## 2017-07-25 DIAGNOSIS — J069 Acute upper respiratory infection, unspecified: Secondary | ICD-10-CM | POA: Insufficient documentation

## 2017-07-25 DIAGNOSIS — R05 Cough: Secondary | ICD-10-CM | POA: Diagnosis not present

## 2017-07-25 NOTE — Progress Notes (Signed)
Patient: Ann Young Female    DOB: [birth year]   77 y.o.   MRN: [id number] Visit Date: 07/25/2017  Today's Provider: Vernie Murders, PA   Chief Complaint  Patient presents with  . URI   Subjective:    HPI Patient here today C/O ongoing symptoms, patient was seen on 07/19/17 and was advised to start Mucinex D, and Delsym. Patient reports cough, nausea, fatigue, and chest congestion. Patient reports medications have not helped. Patient denies any fever, vomiting or diarrhea. Patient reports cough and congestion are worse at night.     Past Medical History:  Diagnosis Date  . Anxiety   . Arthritis   . Asthma   . Cancer ([place])    colorectal  . Depression   . Dyspepsia   . Elevated cholesterol   . GERD (gastroesophageal reflux disease)   . History of hiatal hernia   . Peptic ulcer disease    Past Surgical History:  Procedure Laterality Date  . BREAST BIOPSY Left 2000   benign  . CARDIAC CATHETERIZATION  2015   WNL  . CATARACT EXTRACTION Bilateral 2013  . CATARACT EXTRACTION, BILATERAL Bilateral 2013   Dr. Willy Eddy  . COLON SURGERY    . COLONOSCOPY WITH PROPOFOL N/A 02/04/2016   Procedure: COLONOSCOPY WITH PROPOFOL;  Surgeon: Manya Silvas, MD;  Location: [facility] ENDOSCOPY;  Service: Endoscopy;  Laterality: N/A;  . ESOPHAGOGASTRODUODENOSCOPY (EGD) WITH PROPOFOL N/A 02/04/2016   Procedure: ESOPHAGOGASTRODUODENOSCOPY (EGD) WITH PROPOFOL;  Surgeon: Manya Silvas, MD;  Location: [facility] ENDOSCOPY;  Service: Endoscopy;  Laterality: N/A;  . HYSTEROSCOPY W/D&C N/A 02/04/2017   Procedure: DILATATION AND CURETTAGE /HYSTEROSCOPY;  Surgeon: Schermerhorn, Gwen Her, MD;  Location: [facility] ORS;  Service: Gynecology;  Laterality: N/A;  . resection of colorectal cancer without sequela  1999  . resection of colorectal cancer without sequela    . TUBAL LIGATION  1976   Family History  Problem Relation Age of Onset  . Cirrhosis Sister   . Colon polyps Sister   . Leukemia Mother     . Depression Mother   . Heart disease Father   . Depression Father   . Bipolar disorder Daughter   . Prostate cancer Neg Hx   . Kidney cancer Neg Hx    Allergies  Allergen Reactions  . Clarithromycin Other (See Comments)    Questionable Biaxin vs Flagyl with chest discomfort & irregular heartbeat (most likely thought to be related to the Biaxin)  . Fesoterodine     Other reaction(s): Other (See Comments) weakness  . Ibuprofen Other (See Comments)    To prevent swelling,increase hr/bp Other reaction(s): Other (See Comments) To prevent swelling,increase hr/bp  . Lipitor  [Atorvastatin] Other (See Comments)    Mayalgia  . Metronidazole Other (See Comments)    Questionable Biaxin vs Flagyl with chest discomfort & irregular heartbeat (most likely thought to be related to the Biaxin)  . Other Other (See Comments)    decongestants  . Penicillins Nausea Only  . Simvastatin Other (See Comments)    Hari Loss  . Sulfa Antibiotics     Other reaction(s): Unknown Other reaction(s): Unknown Patient cant remember  . Toviaz  [Fesoterodine Fumarate Er] Other (See Comments)    weakness    Current Outpatient Medications:  .  alendronate (FOSAMAX) 70 MG tablet, Take 1 tablet (70 mg total) by mouth once a week. Reported on 08/18/2015, Disp: 12 tablet, Rfl: 3 .  aspirin 81 MG tablet, Take 81 mg  by mouth daily., Disp: , Rfl:  .  Calcium Carb-Cholecalciferol 600-200 MG-UNIT TABS, Take 1 tablet by mouth 2 (two) times daily., Disp: , Rfl:  .  cetirizine (ZYRTEC) 10 MG tablet, TAKE ONE TABLET BY MOUTH ONCE DAILY FOR ALLERGIES (Patient taking differently: Take 10 mg by mouth daily. TAKE ONE TABLET BY MOUTH ONCE DAILY FOR ALLERGIES), Disp: 90 tablet, Rfl: 3 .  Cholecalciferol (VITAMIN D PO), Take by mouth., Disp: , Rfl:  .  citalopram (CELEXA) 40 MG tablet, Take 40 mg by mouth every morning. , Disp: , Rfl:  .  Cranberry 125 MG TABS, Take 1 capsule by mouth daily., Disp: , Rfl:  .  CRANBERRY PO, Take by  mouth., Disp: , Rfl:  .  fluticasone (FLONASE) 50 MCG/ACT nasal spray, Place 1 spray into both nostrils daily. , Disp: , Rfl:  .  lamoTRIgine (LAMICTAL) 100 MG tablet, , Disp: , Rfl:  .  lovastatin (MEVACOR) 40 MG tablet, Take 1 tablet (40 mg total) by mouth at bedtime., Disp: 90 tablet, Rfl: 3 .  mirtazapine (REMERON) 30 MG tablet, Take 30 mg by mouth at bedtime. , Disp: , Rfl:  .  Multiple Vitamins-Minerals (MULTIVITAMIN PO), Take 1 tablet by mouth daily. , Disp: , Rfl:  .  Omega-3 Fatty Acids (FISH OIL PO), Take 1 capsule by mouth daily. , Disp: , Rfl:  .  pantoprazole (PROTONIX) 40 MG tablet, Take 1 tablet (40 mg total) by mouth daily., Disp: 90 tablet, Rfl: 1 .  Probiotic Product (PROBIOTIC-10 PO), Take 1 tablet by mouth daily., Disp: , Rfl:  .  triamcinolone lotion (KENALOG) 0.1 %, Apply 1 application topically daily as needed. , Disp: , Rfl:   Review of Systems  Constitutional: Positive for activity change, appetite change and fatigue.  HENT: Positive for congestion and rhinorrhea.   Respiratory: Positive for cough.   Cardiovascular: Negative.     Social History   Tobacco Use  . Smoking status: Never Smoker  . Smokeless tobacco: Never Used  Substance Use Topics  . Alcohol use: No    Alcohol/week: 0.0 oz   Objective:   BP 90/60 (BP Location: Left Arm, Patient Position: Sitting, Cuff Size: Normal)   Pulse 80   Temp 98.2 F (36.8 C) (Oral)   Resp 16   Wt 125 lb 12.8 oz (57.1 kg)   SpO2 96%   BMI 24.57 kg/m  Vitals:   07/25/17 1011  BP: 90/60  Pulse: 80  Resp: 16  Temp: 98.2 F (36.8 C)  TempSrc: Oral  SpO2: 96%  Weight: 125 lb 12.8 oz (57.1 kg)   Wt Readings from Last 3 Encounters:  07/25/17 125 lb 12.8 oz (57.1 kg)  07/19/17 129 lb 3.2 oz (58.6 kg)  07/04/17 127 lb 6.4 oz (57.8 kg)    Physical Exam  Constitutional: She is oriented to person, place, and time. She appears well-developed and well-nourished. No distress.  HENT:  Head: Normocephalic and  atraumatic.  Right Ear: Hearing and external ear normal.  Left Ear: Hearing and external ear normal.  Nose: Nose normal.  Slightly reddened posterior pharynx without exudates.  Eyes: Conjunctivae and lids are normal. Right eye exhibits no discharge. Left eye exhibits no discharge. No scleral icterus.  Neck: Neck supple.  Cardiovascular: Normal rate and regular rhythm.  Pulmonary/Chest: Effort normal. No respiratory distress. She has no wheezes. She has rales.  Questionable rales in bases.  Abdominal: Soft. Bowel sounds are normal.  Musculoskeletal: Normal range of motion.  Neurological: She  is alert and oriented to person, place, and time.  Skin: Skin is intact. No lesion and no rash noted.  Psychiatric: She has a normal mood and affect. Her speech is normal and behavior is normal. Thought content normal.       Assessment & Plan:     1. Rales Still having yellow thick sputum production worse in the evening and early morning. No fever now. Questionable rales on exam today. Will check CBC and CXR to rule out pneumonia. - CBC with Differential/Platelet - DG Chest 2 View  2. URI with cough and congestion Onset over the past 10 days. Symptomatic treatment has not helped a great deal. May need antibiotic and steroid taper pending lab and CXR report. - CBC with Differential/Platelet - DG Chest [place], [facility]

## 2017-07-26 ENCOUNTER — Ambulatory Visit: Payer: Self-pay | Admitting: Family Medicine

## 2017-07-26 ENCOUNTER — Other Ambulatory Visit: Payer: Self-pay | Admitting: Family Medicine

## 2017-07-26 ENCOUNTER — Other Ambulatory Visit: Payer: Self-pay

## 2017-07-26 DIAGNOSIS — R059 Cough, unspecified: Secondary | ICD-10-CM

## 2017-07-26 DIAGNOSIS — R05 Cough: Secondary | ICD-10-CM

## 2017-07-26 DIAGNOSIS — J069 Acute upper respiratory infection, unspecified: Secondary | ICD-10-CM

## 2017-07-26 LAB — CBC WITH DIFFERENTIAL/PLATELET
BASOS ABS: 0 10*3/uL (ref 0.0–0.2)
Basos: 1 %
EOS (ABSOLUTE): 0.1 10*3/uL (ref 0.0–0.4)
Eos: 2 %
HEMOGLOBIN: 12.6 g/dL (ref 11.1–15.9)
Hematocrit: 37.2 % (ref 34.0–46.6)
IMMATURE GRANS (ABS): 0 10*3/uL (ref 0.0–0.1)
Immature Granulocytes: 1 %
LYMPHS: 32 %
Lymphocytes Absolute: 1.4 10*3/uL (ref 0.7–3.1)
MCH: 31.9 pg (ref 26.6–33.0)
MCHC: 33.9 g/dL (ref 31.5–35.7)
MCV: 94 fL (ref 79–97)
MONOCYTES: 9 %
Monocytes Absolute: 0.4 10*3/uL (ref 0.1–0.9)
Neutrophils Absolute: 2.4 10*3/uL (ref 1.4–7.0)
Neutrophils: 55 %
PLATELETS: 189 10*3/uL (ref 150–379)
RBC: 3.95 x10E6/uL (ref 3.77–5.28)
RDW: 12.5 % (ref 12.3–15.4)
WBC: 4.4 10*3/uL (ref 3.4–10.8)

## 2017-07-26 MED ORDER — PREDNISONE 5 MG PO TABS: ORAL_TABLET | ORAL | 0 refills | Status: DC

## 2017-08-23 ENCOUNTER — Ambulatory Visit
Admission: RE | Admit: 2017-08-23 | Discharge: 2017-08-23 | Disposition: A | Discharge status: Home / Self Care | Payer: Medicare Other | Source: Ambulatory Visit | Attending: Family Medicine | Admitting: Family Medicine

## 2017-08-23 ENCOUNTER — Telehealth: Payer: Self-pay

## 2017-08-23 DIAGNOSIS — Z1231 Encounter for screening mammogram for malignant neoplasm of breast: Secondary | ICD-10-CM | POA: Diagnosis not present

## 2017-08-23 NOTE — Telephone Encounter (Signed)
-----   Message from Margo Common, [place] sent at 08/23/2017 11:08 AM EDT ----- Normal mammograms without sign of masses. Proceed with Medicare Wellness Exam in January 2020. Continue self-breast exams each month at home.

## 2017-08-23 NOTE — Telephone Encounter (Signed)
Left message to call back  

## 2017-08-23 NOTE — Telephone Encounter (Signed)
Advised patient as below.  

## 2017-08-23 NOTE — Telephone Encounter (Signed)
Pt returning your call

## 2017-08-24 ENCOUNTER — Telehealth: Payer: Self-pay

## 2017-08-24 NOTE — Telephone Encounter (Signed)
-----   Message from Margo Common, [place] sent at 08/23/2017 11:08 AM EDT ----- Normal mammograms without sign of masses. Proceed with Medicare Wellness Exam in January 2020. Continue self-breast exams each month at home.

## 2017-08-24 NOTE — Telephone Encounter (Signed)
[id number] 

## 2017-08-26 NOTE — Telephone Encounter (Signed)
Left results on home voicemail okay per DPR in chart.

## 2017-08-31 DIAGNOSIS — G4700 Insomnia, unspecified: Secondary | ICD-10-CM | POA: Diagnosis not present

## 2017-08-31 DIAGNOSIS — F410 Panic disorder [episodic paroxysmal anxiety] without agoraphobia: Secondary | ICD-10-CM | POA: Diagnosis not present

## 2017-08-31 DIAGNOSIS — F332 Major depressive disorder, recurrent severe without psychotic features: Secondary | ICD-10-CM | POA: Diagnosis not present

## 2017-08-31 DIAGNOSIS — F411 Generalized anxiety disorder: Secondary | ICD-10-CM | POA: Diagnosis not present

## 2017-09-14 ENCOUNTER — Other Ambulatory Visit: Payer: Self-pay | Admitting: Family Medicine

## 2017-09-14 DIAGNOSIS — E782 Mixed hyperlipidemia: Secondary | ICD-10-CM

## 2017-09-14 NOTE — Telephone Encounter (Signed)
Please review. Thanks!  

## 2017-09-14 NOTE — Telephone Encounter (Signed)
Pt requesting refill of Lovastatin 40 MG sent to [facility].

## 2017-09-15 DIAGNOSIS — H04123 Dry eye syndrome of bilateral lacrimal glands: Secondary | ICD-10-CM | POA: Diagnosis not present

## 2017-09-15 MED ORDER — LOVASTATIN 40 MG PO TABS: 40.0000 mg | ORAL_TABLET | Freq: Every day | ORAL | 3 refills | Status: DC

## 2017-09-15 NOTE — Telephone Encounter (Signed)
Proceed with refill and follow up as previously planned. Has had no side effects from Lovastatin. Sent refill to [facility].

## 2017-09-19 DIAGNOSIS — H6123 Impacted cerumen, bilateral: Secondary | ICD-10-CM | POA: Diagnosis not present

## 2017-09-19 DIAGNOSIS — H902 Conductive hearing loss, unspecified: Secondary | ICD-10-CM | POA: Diagnosis not present

## 2017-10-14 DIAGNOSIS — L820 Inflamed seborrheic keratosis: Secondary | ICD-10-CM | POA: Diagnosis not present

## 2017-10-14 DIAGNOSIS — L821 Other seborrheic keratosis: Secondary | ICD-10-CM | POA: Diagnosis not present

## 2017-10-14 DIAGNOSIS — L710 Perioral dermatitis: Secondary | ICD-10-CM | POA: Diagnosis not present

## 2017-10-14 DIAGNOSIS — L812 Freckles: Secondary | ICD-10-CM | POA: Diagnosis not present

## 2017-10-19 ENCOUNTER — Telehealth: Payer: Self-pay | Admitting: Emergency Medicine

## 2017-10-19 NOTE — Telephone Encounter (Signed)
Pt calling concerned because her hair is falling out. She reports that she spoke with the pharmacist about this and they told her that it could be the lovastatin. She would like to know if there is something else that she can take instead that is not a statin.

## 2017-10-20 NOTE — Telephone Encounter (Signed)
May switch to OTC Red Yeast Rice Extract one daily ([employer] brand) and need to see the amount of hair loss. Some loss is normal over time. Schedule appointment.

## 2017-10-20 NOTE — Telephone Encounter (Signed)
Patient advised. Follow up scheduled.  

## 2017-10-31 ENCOUNTER — Other Ambulatory Visit: Payer: Self-pay | Admitting: Family Medicine

## 2017-10-31 ENCOUNTER — Telehealth: Payer: Self-pay | Admitting: Family Medicine

## 2017-10-31 DIAGNOSIS — Z889 Allergy status to unspecified drugs, medicaments and biological substances status: Secondary | ICD-10-CM

## 2017-10-31 DIAGNOSIS — M810 Age-related osteoporosis without current pathological fracture: Secondary | ICD-10-CM

## 2017-10-31 MED ORDER — CETIRIZINE HCL 10 MG PO TABS: ORAL_TABLET | ORAL | 3 refills | Status: DC

## 2017-10-31 MED ORDER — ALENDRONATE SODIUM 70 MG PO TABS: 70.0000 mg | ORAL_TABLET | ORAL | 3 refills | Status: DC

## 2017-10-31 NOTE — Telephone Encounter (Signed)
Patient needs refills on Cetirizine 10 mg. and Alendronate 10 mg. Sent to [facility] on [address]

## 2017-10-31 NOTE — Telephone Encounter (Signed)
Done. Has an appointment on 11-10-17.

## 2017-11-02 DIAGNOSIS — F332 Major depressive disorder, recurrent severe without psychotic features: Secondary | ICD-10-CM | POA: Diagnosis not present

## 2017-11-02 DIAGNOSIS — F411 Generalized anxiety disorder: Secondary | ICD-10-CM | POA: Diagnosis not present

## 2017-11-02 DIAGNOSIS — G4700 Insomnia, unspecified: Secondary | ICD-10-CM | POA: Diagnosis not present

## 2017-11-02 DIAGNOSIS — F410 Panic disorder [episodic paroxysmal anxiety] without agoraphobia: Secondary | ICD-10-CM | POA: Diagnosis not present

## 2017-11-03 ENCOUNTER — Ambulatory Visit: Payer: Self-pay | Admitting: Family Medicine

## 2017-11-09 ENCOUNTER — Telehealth: Payer: Self-pay | Admitting: Family Medicine

## 2017-11-09 NOTE — Telephone Encounter (Signed)
Left patient a message to return call for an appt. °

## 2017-11-10 ENCOUNTER — Encounter: Payer: Self-pay | Admitting: Family Medicine

## 2017-11-10 ENCOUNTER — Ambulatory Visit (INDEPENDENT_AMBULATORY_CARE_PROVIDER_SITE_OTHER): Payer: Medicare Other | Admitting: Family Medicine

## 2017-11-10 VITALS — BP 102/78 | HR 81 | Temp 98.7°F | Ht 60.0 in | Wt 128.0 lb

## 2017-11-10 DIAGNOSIS — E782 Mixed hyperlipidemia: Secondary | ICD-10-CM | POA: Diagnosis not present

## 2017-11-10 DIAGNOSIS — F329 Major depressive disorder, single episode, unspecified: Secondary | ICD-10-CM | POA: Diagnosis not present

## 2017-11-10 DIAGNOSIS — F419 Anxiety disorder, unspecified: Secondary | ICD-10-CM

## 2017-11-10 DIAGNOSIS — L659 Nonscarring hair loss, unspecified: Secondary | ICD-10-CM

## 2017-11-10 NOTE — Progress Notes (Signed)
Patient: Ann Young Female    DOB: 1941-04-09   77 y.o.   MRN: [id number] Visit Date: 11/10/2017  Today's Provider: Vernie Murders, PA   Chief Complaint  Patient presents with  . Alopecia  . Hyperlipidemia    follow up    Subjective:    HPI Patient presents today C/O hair loss. She reports she noticed hair thinning started when she was taking Simvastatin. She was then switched to Lovastatin and now just taking Red Yeast Rice. She states she does not notice any difference with the hair loss since she stopped taking Lovastatin.     Past Medical History:  Diagnosis Date  . Anxiety   . Arthritis   . Asthma   . Cancer ([place])    colorectal  . Depression   . Dyspepsia   . Elevated cholesterol   . GERD (gastroesophageal reflux disease)   . History of hiatal hernia   . Peptic ulcer disease    Past Surgical History:  Procedure Laterality Date  . BREAST BIOPSY Left 2000   benign  . BREAST CYST ASPIRATION    . CARDIAC CATHETERIZATION  2015   WNL  . CATARACT EXTRACTION Bilateral 2013  . CATARACT EXTRACTION, BILATERAL Bilateral 2013   Dr. Willy Eddy  . COLON SURGERY    . COLONOSCOPY WITH PROPOFOL N/A 02/04/2016   Procedure: COLONOSCOPY WITH PROPOFOL;  Surgeon: Manya Silvas, MD;  Location: [facility] ENDOSCOPY;  Service: Endoscopy;  Laterality: N/A;  . ESOPHAGOGASTRODUODENOSCOPY (EGD) WITH PROPOFOL N/A 02/04/2016   Procedure: ESOPHAGOGASTRODUODENOSCOPY (EGD) WITH PROPOFOL;  Surgeon: Manya Silvas, MD;  Location: [facility] ENDOSCOPY;  Service: Endoscopy;  Laterality: N/A;  . HYSTEROSCOPY W/D&C N/A 02/04/2017   Procedure: DILATATION AND CURETTAGE /HYSTEROSCOPY;  Surgeon: Schermerhorn, Gwen Her, MD;  Location: [facility] ORS;  Service: Gynecology;  Laterality: N/A;  . resection of colorectal cancer without sequela  1999  . resection of colorectal cancer without sequela    . TUBAL LIGATION  1976   Family History  Problem Relation Age of Onset  . Cirrhosis Sister   . Colon polyps  Sister   . Leukemia Mother   . Depression Mother   . Heart disease Father   . Depression Father   . Bipolar disorder Daughter   . Prostate cancer Neg Hx   . Kidney cancer Neg Hx   . Breast cancer Neg Hx    Allergies  Allergen Reactions  . Clarithromycin Other (See Comments)    Questionable Biaxin vs Flagyl with chest discomfort & irregular heartbeat (most likely thought to be related to the Biaxin)  . Fesoterodine     Other reaction(s): Other (See Comments) weakness  . Ibuprofen Other (See Comments)    To prevent swelling,increase hr/bp Other reaction(s): Other (See Comments) To prevent swelling,increase hr/bp  . Lipitor  [Atorvastatin] Other (See Comments)    Mayalgia  . Metronidazole Other (See Comments)    Questionable Biaxin vs Flagyl with chest discomfort & irregular heartbeat (most likely thought to be related to the Biaxin)  . Other Other (See Comments)    decongestants  . Penicillins Nausea Only  . Simvastatin Other (See Comments)    Hari Loss  . Sulfa Antibiotics     Other reaction(s): Unknown Other reaction(s): Unknown Patient cant remember  . Toviaz  [Fesoterodine Fumarate Er] Other (See Comments)    weakness    Current Outpatient Medications:  .  alendronate (FOSAMAX) 70 MG tablet, Take 1 tablet (70 mg total) by mouth  once a week. Reported on 08/18/2015, Disp: 12 tablet, Rfl: 3 .  aspirin 81 MG tablet, Take 81 mg by mouth daily., Disp: , Rfl:  .  Calcium Carb-Cholecalciferol 600-200 MG-UNIT TABS, Take 1 tablet by mouth 2 (two) times daily., Disp: , Rfl:  .  cetirizine (ZYRTEC) 10 MG tablet, TAKE ONE TABLET BY MOUTH ONCE DAILY FOR ALLERGIES, Disp: 90 tablet, Rfl: 3 .  Cholecalciferol (VITAMIN D PO), Take by mouth., Disp: , Rfl:  .  Cranberry 125 MG TABS, Take 1 capsule by mouth daily., Disp: , Rfl:  .  fluticasone (FLONASE) 50 MCG/ACT nasal spray, Place 1 spray into both nostrils daily. , Disp: , Rfl:  .  lamoTRIgine (LAMICTAL) 100 MG tablet, , Disp: , Rfl:  .   mirtazapine (REMERON) 30 MG tablet, Take 30 mg by mouth at bedtime. , Disp: , Rfl:  .  Multiple Vitamins-Minerals (MULTIVITAMIN PO), Take 1 tablet by mouth daily. , Disp: , Rfl:  .  Omega-3 Fatty Acids (FISH OIL PO), Take 1 capsule by mouth daily. , Disp: , Rfl:  .  pantoprazole (PROTONIX) 40 MG tablet, Take 1 tablet (40 mg total) by mouth daily., Disp: 90 tablet, Rfl: 1 .  Red Yeast Rice Extract (RED YEAST RICE PO), Take by mouth., Disp: , Rfl:  .  triamcinolone lotion (KENALOG) 0.1 %, Apply 1 application topically daily as needed. , Disp: , Rfl:  .  citalopram (CELEXA) 40 MG tablet, Take 40 mg by mouth every morning. , Disp: , Rfl:  .  lovastatin (MEVACOR) 40 MG tablet, Take 1 tablet (40 mg total) by mouth at bedtime. (Patient not taking: Reported on 11/10/2017), Disp: 90 tablet, Rfl: 3  Review of Systems  Constitutional: Negative.        Hair loss   Respiratory: Negative.   Cardiovascular: Negative.    Social History   Tobacco Use  . Smoking status: Never Smoker  . Smokeless tobacco: Never Used  Substance Use Topics  . Alcohol use: No    Alcohol/week: 0.0 oz   Objective:   There were no vitals taken for this visit.  Physical Exam  Constitutional: She is oriented to person, place, and time. She appears well-developed and well-nourished. No distress.  HENT:  Head: Normocephalic and atraumatic.  Right Ear: Hearing normal.  Left Ear: Hearing normal.  Nose: Nose normal.  Eyes: Conjunctivae and lids are normal. Right eye exhibits no discharge. Left eye exhibits no discharge. No scleral icterus.  Neck: No thyromegaly present.  Pulmonary/Chest: Effort normal and breath sounds normal. No respiratory distress.  Abdominal: Soft. Bowel sounds are normal.  Musculoskeletal: Normal range of motion.  Lymphadenopathy:    She has no cervical adenopathy.  Neurological: She is alert and oriented to person, place, and time.  Skin: Skin is intact. No lesion and no rash noted.  Slight thinning  of hair in vertex and both temples.  Psychiatric: She has a normal mood and affect. Her speech is normal and behavior is normal. Thought content normal.      Assessment & Plan:     1. Hair thinning Noticing "a lot of hair coming out" with combing or running her fingers through her hair over the past month or two. States she gets a permanent every 3-4 months. Some family history of hair loss/balding but primarily in males. One sister has a thyroid disease. Will check labs for metabolic or thyroid issues. Recheck pending reports. - CBC with Differential/Platelet - Comprehensive metabolic panel - TSH - T4  2. Mixed hyperlipidemia Stopped the Lovastatin due to concern of it being the cause of hair loss. Been off it for 3 weeks and no change in the amount of hair loss noticed with combing. May proceed with use of Red Yeast Rice 600 mg qd and get follow up labs to assess hyperlipidemia treatment. Recheck pending lab reports. - Comprehensive metabolic panel - Lipid panel - TSH - T4  3. Anxiety and depression Psychiatrist has stopped the Mirtazepine and Celexa. Presently only takes the Lamictal. Continue regular follow up with psychiatrist and recheck CMP, TSH and T4.  - Comprehensive metabolic panel - TSH - T4       Vernie Murders, PA  [facility]

## 2017-11-11 ENCOUNTER — Other Ambulatory Visit: Payer: Self-pay | Admitting: Family Medicine

## 2017-11-11 ENCOUNTER — Telehealth: Payer: Self-pay | Admitting: Family Medicine

## 2017-11-11 LAB — COMPREHENSIVE METABOLIC PANEL
ALBUMIN: 4.3 g/dL (ref 3.5–4.8)
ALT: 11 IU/L (ref 0–32)
AST: 16 IU/L (ref 0–40)
Albumin/Globulin Ratio: 2 (ref 1.2–2.2)
Alkaline Phosphatase: 48 IU/L (ref 39–117)
BUN / CREAT RATIO: 20 (ref 12–28)
BUN: 17 mg/dL (ref 8–27)
Bilirubin Total: 0.3 mg/dL (ref 0.0–1.2)
CO2: 26 mmol/L (ref 20–29)
CREATININE: 0.85 mg/dL (ref 0.57–1.00)
Calcium: 9.4 mg/dL (ref 8.7–10.3)
Chloride: 102 mmol/L (ref 96–106)
GFR calc Af Amer: 78 mL/min/{1.73_m2} (ref >59)
GFR calc non Af Amer: 68 mL/min/{1.73_m2} (ref >59)
GLUCOSE: 89 mg/dL (ref 65–99)
Globulin, Total: 2.2 g/dL (ref 1.5–4.5)
Potassium: 4 mmol/L (ref 3.5–5.2)
Sodium: 143 mmol/L (ref 134–144)
TOTAL PROTEIN: 6.5 g/dL (ref 6.0–8.5)

## 2017-11-11 LAB — CBC WITH DIFFERENTIAL/PLATELET
BASOS ABS: 0 10*3/uL (ref 0.0–0.2)
Basos: 1 %
EOS (ABSOLUTE): 0.1 10*3/uL (ref 0.0–0.4)
Eos: 3 %
HEMOGLOBIN: 12.1 g/dL (ref 11.1–15.9)
Hematocrit: 37.2 % (ref 34.0–46.6)
IMMATURE GRANS (ABS): 0 10*3/uL (ref 0.0–0.1)
Immature Granulocytes: 0 %
LYMPHS: 31 %
Lymphocytes Absolute: 1.3 10*3/uL (ref 0.7–3.1)
MCH: 31 pg (ref 26.6–33.0)
MCHC: 32.5 g/dL (ref 31.5–35.7)
MCV: 95 fL (ref 79–97)
MONOCYTES: 9 %
Monocytes Absolute: 0.4 10*3/uL (ref 0.1–0.9)
NEUTROS PCT: 56 %
Neutrophils Absolute: 2.3 10*3/uL (ref 1.4–7.0)
PLATELETS: 173 10*3/uL (ref 150–450)
RBC: 3.9 x10E6/uL (ref 3.77–5.28)
RDW: 12.8 % (ref 12.3–15.4)
WBC: 4 10*3/uL (ref 3.4–10.8)

## 2017-11-11 LAB — LIPID PANEL
CHOLESTEROL TOTAL: 260 mg/dL — AB (ref 100–199)
Chol/HDL Ratio: 4.6 ratio — ABNORMAL HIGH (ref 0.0–4.4)
HDL: 57 mg/dL (ref >39)
LDL CALC: 183 mg/dL — AB (ref 0–99)
Triglycerides: 98 mg/dL (ref 0–149)
VLDL CHOLESTEROL CAL: 20 mg/dL (ref 5–40)

## 2017-11-11 LAB — TSH: TSH: 5.09 u[IU]/mL — ABNORMAL HIGH (ref 0.450–4.500)

## 2017-11-11 LAB — T4: T4 TOTAL: 6.6 ug/dL (ref 4.5–12.0)

## 2017-11-11 NOTE — Telephone Encounter (Signed)
Would like to be mailed a copy of her lab results from labs on 11/10/17 to her home.  Con Memos

## 2017-11-15 NOTE — Telephone Encounter (Signed)
Lab results printed at the front desk for pickup. Left patient a message advising her that we are unable to mail results and that the copy is ready for pick up.

## 2017-11-29 ENCOUNTER — Telehealth: Payer: Self-pay | Admitting: Family Medicine

## 2017-11-29 ENCOUNTER — Other Ambulatory Visit: Payer: Self-pay | Admitting: Family Medicine

## 2017-11-29 NOTE — Telephone Encounter (Signed)
Pt stated that her hair is falling out a lot and she wanted to see if she should go ahead and have thyroid rechecked instead of waiting for 3 months. Please advise. Thanks TNP

## 2017-11-29 NOTE — Telephone Encounter (Signed)
LMTCB

## 2017-11-29 NOTE — Telephone Encounter (Signed)
Just checked it 2 weeks ago. Will not have changed in that short length of time. Recommend using Women's Rogaine 5% Foam to help regrow hair. If no help, should be referred to a dermatologist if balding occurring.

## 2017-11-30 NOTE — Telephone Encounter (Signed)
Patient advised.

## 2017-12-02 DIAGNOSIS — L821 Other seborrheic keratosis: Secondary | ICD-10-CM | POA: Diagnosis not present

## 2017-12-02 DIAGNOSIS — L710 Perioral dermatitis: Secondary | ICD-10-CM | POA: Diagnosis not present

## 2018-01-04 DIAGNOSIS — G4700 Insomnia, unspecified: Secondary | ICD-10-CM | POA: Diagnosis not present

## 2018-01-04 DIAGNOSIS — F332 Major depressive disorder, recurrent severe without psychotic features: Secondary | ICD-10-CM | POA: Diagnosis not present

## 2018-01-04 DIAGNOSIS — F411 Generalized anxiety disorder: Secondary | ICD-10-CM | POA: Diagnosis not present

## 2018-01-04 DIAGNOSIS — F410 Panic disorder [episodic paroxysmal anxiety] without agoraphobia: Secondary | ICD-10-CM | POA: Diagnosis not present

## 2018-01-08 ENCOUNTER — Other Ambulatory Visit: Payer: Self-pay | Admitting: Family Medicine

## 2018-01-08 DIAGNOSIS — K317 Polyp of stomach and duodenum: Secondary | ICD-10-CM

## 2018-01-08 DIAGNOSIS — K2100 Gastro-esophageal reflux disease with esophagitis, without bleeding: Secondary | ICD-10-CM

## 2018-01-31 DIAGNOSIS — F332 Major depressive disorder, recurrent severe without psychotic features: Secondary | ICD-10-CM | POA: Diagnosis not present

## 2018-01-31 DIAGNOSIS — F411 Generalized anxiety disorder: Secondary | ICD-10-CM | POA: Diagnosis not present

## 2018-01-31 DIAGNOSIS — F410 Panic disorder [episodic paroxysmal anxiety] without agoraphobia: Secondary | ICD-10-CM | POA: Diagnosis not present

## 2018-01-31 DIAGNOSIS — G4700 Insomnia, unspecified: Secondary | ICD-10-CM | POA: Diagnosis not present

## 2018-02-10 ENCOUNTER — Ambulatory Visit (INDEPENDENT_AMBULATORY_CARE_PROVIDER_SITE_OTHER): Payer: Medicare Other | Admitting: Family Medicine

## 2018-02-10 ENCOUNTER — Encounter: Payer: Self-pay | Admitting: Family Medicine

## 2018-02-10 VITALS — BP 122/66 | HR 75 | Temp 97.9°F | Wt 125.0 lb

## 2018-02-10 DIAGNOSIS — F418 Other specified anxiety disorders: Secondary | ICD-10-CM

## 2018-02-10 DIAGNOSIS — E782 Mixed hyperlipidemia: Secondary | ICD-10-CM

## 2018-02-10 DIAGNOSIS — E038 Other specified hypothyroidism: Secondary | ICD-10-CM

## 2018-02-10 DIAGNOSIS — E039 Hypothyroidism, unspecified: Secondary | ICD-10-CM

## 2018-02-10 NOTE — Progress Notes (Signed)
Patient: Ann Young Female    DOB: [birth year]   77 y.o.   MRN: [id number] Visit Date: 02/10/2018  Today's Provider: Vernie Murders, PA   Chief Complaint  Patient presents with  . Hyperlipidemia  . Alopecia   Subjective:   Pt comes in today for a follow up on Cholesterol and hair loss.  She states she has restarted Lovastatin 40mg  about a month ago after finding out Lamictal was causing her hair loss.  Pt states Dr. Nicolasa Ducking discontinues Lamictal and started on buspirone, and mirtazapine.  Pt reports her hair loss has improved and is doing okay with the current medications.  She is also due to have her thyroid rechecked secondary to being slightly low at last check.    Hyperlipidemia  This is a chronic problem. The problem is uncontrolled. Recent lipid tests were reviewed and are high. Pertinent negatives include no chest pain. Current antihyperlipidemic treatment includes statins. There are no compliance problems.    Lab Results  Component Value Date   CHOL 260 (H) 11/10/2017   CHOL 222 (H) 07/04/2017   CHOL 188 12/27/2016   Lab Results  Component Value Date   HDL 57 11/10/2017   HDL 73 07/04/2017   HDL 72 12/27/2016   Lab Results  Component Value Date   LDLCALC 183 (H) 11/10/2017   LDLCALC 133 (H) 07/04/2017   LDLCALC 101 (H) 12/27/2016   Lab Results  Component Value Date   TRIG 98 11/10/2017   TRIG 80 07/04/2017   TRIG 75 12/27/2016   Lab Results  Component Value Date   CHOLHDL 4.6 (H) 11/10/2017   CHOLHDL 3.0 07/04/2017   CHOLHDL 2.6 12/27/2016   No results found for: LDLDIRECT Lab Results  Component Value Date   TSH 5.090 (H) 11/10/2017      Past Medical History:  Diagnosis Date  . Anxiety   . Arthritis   . Asthma   . Cancer ([place])    colorectal  . Depression   . Dyspepsia   . Elevated cholesterol   . GERD (gastroesophageal reflux disease)   . History of hiatal hernia   . Peptic ulcer disease    Past Surgical History:  Procedure  Laterality Date  . BREAST BIOPSY Left 2000   benign  . BREAST CYST ASPIRATION    . CARDIAC CATHETERIZATION  2015   WNL  . CATARACT EXTRACTION Bilateral 2013  . CATARACT EXTRACTION, BILATERAL Bilateral 2013   Dr. Willy Eddy  . COLON SURGERY    . COLONOSCOPY WITH PROPOFOL N/A 02/04/2016   Procedure: COLONOSCOPY WITH PROPOFOL;  Surgeon: Manya Silvas, MD;  Location: [facility] ENDOSCOPY;  Service: Endoscopy;  Laterality: N/A;  . ESOPHAGOGASTRODUODENOSCOPY (EGD) WITH PROPOFOL N/A 02/04/2016   Procedure: ESOPHAGOGASTRODUODENOSCOPY (EGD) WITH PROPOFOL;  Surgeon: Manya Silvas, MD;  Location: [facility] ENDOSCOPY;  Service: Endoscopy;  Laterality: N/A;  . HYSTEROSCOPY W/D&C N/A 02/04/2017   Procedure: DILATATION AND CURETTAGE /HYSTEROSCOPY;  Surgeon: Schermerhorn, Gwen Her, MD;  Location: [facility] ORS;  Service: Gynecology;  Laterality: N/A;  . resection of colorectal cancer without sequela  1999  . resection of colorectal cancer without sequela    . TUBAL LIGATION  1976   Family History  Problem Relation Age of Onset  . Cirrhosis Sister   . Colon polyps Sister   . Leukemia Mother   . Depression Mother   . Heart disease Father   . Depression Father   . Bipolar disorder Daughter   . Prostate cancer  Neg Hx   . Kidney cancer Neg Hx   . Breast cancer Neg Hx    Allergies  Allergen Reactions  . Clarithromycin Other (See Comments)    Questionable Biaxin vs Flagyl with chest discomfort & irregular heartbeat (most likely thought to be related to the Biaxin)  . Fesoterodine     Other reaction(s): Other (See Comments) weakness  . Ibuprofen Other (See Comments)    To prevent swelling,increase hr/bp Other reaction(s): Other (See Comments) To prevent swelling,increase hr/bp  . Lipitor  [Atorvastatin] Other (See Comments)    Mayalgia  . Metronidazole Other (See Comments)    Questionable Biaxin vs Flagyl with chest discomfort & irregular heartbeat (most likely thought to be related to the Biaxin)  . Other  Other (See Comments)    decongestants  . Penicillins Nausea Only  . Simvastatin Other (See Comments)    Hari Loss  . Sulfa Antibiotics     Other reaction(s): Unknown Other reaction(s): Unknown Patient cant remember  . Toviaz  [Fesoterodine Fumarate Er] Other (See Comments)    weakness    Current Outpatient Medications:  .  alendronate (FOSAMAX) 70 MG tablet, Take 1 tablet (70 mg total) by mouth once a week. Reported on 08/18/2015, Disp: 12 tablet, Rfl: 3 .  aspirin 81 MG tablet, Take 81 mg by mouth daily., Disp: , Rfl:  .  busPIRone (BUSPAR) 10 MG tablet, , Disp: , Rfl:  .  Calcium Carb-Cholecalciferol 600-200 MG-UNIT TABS, Take 1 tablet by mouth 2 (two) times daily., Disp: , Rfl:  .  cetirizine (ZYRTEC) 10 MG tablet, TAKE ONE TABLET BY MOUTH ONCE DAILY FOR ALLERGIES, Disp: 90 tablet, Rfl: 3 .  Cholecalciferol (VITAMIN D PO), Take by mouth., Disp: , Rfl:  .  fluticasone (FLONASE) 50 MCG/ACT nasal spray, Place 1 spray into both nostrils daily. , Disp: , Rfl:  .  lovastatin (MEVACOR) 40 MG tablet, Take 1 tablet (40 mg total) by mouth at bedtime., Disp: 90 tablet, Rfl: 3 .  mirtazapine (REMERON) 30 MG tablet, , Disp: , Rfl:  .  Multiple Vitamins-Minerals (MULTIVITAMIN PO), Take 1 tablet by mouth daily. , Disp: , Rfl:  .  Omega-3 Fatty Acids (FISH OIL PO), Take 1 capsule by mouth daily. , Disp: , Rfl:  .  pantoprazole (PROTONIX) 40 MG tablet, TAKE 1 TABLET BY MOUTH ONCE DAILY, Disp: 90 tablet, Rfl: 1 .  triamcinolone lotion (KENALOG) 0.1 %, Apply 1 application topically daily as needed. , Disp: , Rfl:  .  Cranberry 125 MG TABS, Take 1 capsule by mouth daily., Disp: , Rfl:  .  lamoTRIgine (LAMICTAL) 100 MG tablet, , Disp: , Rfl:  .  Red Yeast Rice Extract (RED YEAST RICE PO), Take by mouth., Disp: , Rfl:   Review of Systems  Constitutional: Negative.   Respiratory: Negative.   Cardiovascular: Positive for leg swelling. Negative for chest pain and palpitations.  Gastrointestinal: Negative.    Endocrine: Negative.   Musculoskeletal: Negative.   Neurological: Positive for headaches (Pt reports waking up with a "little bit of a headache this morning." ). Negative for dizziness and light-headedness.   Social History   Tobacco Use  . Smoking status: Never Smoker  . Smokeless tobacco: Never Used  Substance Use Topics  . Alcohol use: No    Alcohol/week: 0.0 standard drinks   Objective:   BP 122/66 (BP Location: Right Arm, Patient Position: Sitting, Cuff Size: Normal)   Pulse 75   Temp 97.9 F (36.6 C) (Oral)  Wt 125 lb (56.7 kg)   SpO2 97%   BMI 24.41 kg/m   Wt Readings from Last 3 Encounters:  02/10/18 125 lb (56.7 kg)  11/10/17 128 lb (58.1 kg)  07/25/17 125 lb 12.8 oz (57.1 kg)   Vitals:   02/10/18 1052  BP: 122/66  Pulse: 75  Temp: 97.9 F (36.6 C)  TempSrc: Oral  SpO2: 97%  Weight: 125 lb (56.7 kg)   Physical Exam  Constitutional: She is oriented to person, place, and time. She appears well-developed and well-nourished. No distress.  HENT:  Head: Normocephalic and atraumatic.  Right Ear: Hearing normal.  Left Ear: Hearing normal.  Nose: Nose normal.  Eyes: Conjunctivae and lids are normal. Right eye exhibits no discharge. Left eye exhibits no discharge. No scleral icterus.  Cardiovascular: Normal rate and regular rhythm.  Pulmonary/Chest: Effort normal. No respiratory distress.  Musculoskeletal: Normal range of motion.  Neurological: She is alert and oriented to person, place, and time.  Skin: Skin is intact. No lesion and no rash noted.  Psychiatric: She has a normal mood and affect. Her speech is normal and behavior is normal. Thought content normal.      Assessment & Plan:     1. Subclinical hypothyroidism TSH was elevated 11-10-17. Was having hair thinning/loss at that time. After stopping the Lamictal, hair seems to be better. Denies palpitations, diarrhea, tremors, weight loss or intolerance to heat or cold. Will recheck labs and follow up  pending reports. - TSH - CBC with Differential/Platelet - T4  2. Mixed hyperlipidemia Went back on the Lovastatin 40 mg hs when she discovered the Lamictal was the cause of her hair thinning and stopped it. Will recheck labs to assess progress. - TSH - Lipid panel - Comprehensive metabolic panel  3. Depression with anxiety Feeling stable without suicidal ideation. Sleeping fairly well. Changed Lamictal to Buspar with Mirtazepine and found hair thinning to reverse. Followed by Dr. Nicolasa Ducking (psychiatry). Recheck routine labs. - CBC with Differential/Platelet - Comprehensive metabolic panel       Vernie Murders, PA  [facility]

## 2018-02-11 LAB — CBC WITH DIFFERENTIAL/PLATELET
BASOS ABS: 0 10*3/uL (ref 0.0–0.2)
Basos: 1 %
EOS (ABSOLUTE): 0.1 10*3/uL (ref 0.0–0.4)
Eos: 3 %
HEMOGLOBIN: 12.9 g/dL (ref 11.1–15.9)
Hematocrit: 39.6 % (ref 34.0–46.6)
Immature Grans (Abs): 0 10*3/uL (ref 0.0–0.1)
Immature Granulocytes: 0 %
Lymphocytes Absolute: 1.6 10*3/uL (ref 0.7–3.1)
Lymphs: 40 %
MCH: 31 pg (ref 26.6–33.0)
MCHC: 32.6 g/dL (ref 31.5–35.7)
MCV: 95 fL (ref 79–97)
MONOS ABS: 0.4 10*3/uL (ref 0.1–0.9)
Monocytes: 9 %
NEUTROS ABS: 1.9 10*3/uL (ref 1.4–7.0)
Neutrophils: 47 %
Platelets: 164 10*3/uL (ref 150–450)
RBC: 4.16 x10E6/uL (ref 3.77–5.28)
RDW: 11.9 % — AB (ref 12.3–15.4)
WBC: 4.1 10*3/uL (ref 3.4–10.8)

## 2018-02-11 LAB — LIPID PANEL
CHOL/HDL RATIO: 3.4 ratio (ref 0.0–4.4)
Cholesterol, Total: 209 mg/dL — ABNORMAL HIGH (ref 100–199)
HDL: 62 mg/dL (ref >39)
LDL CALC: 129 mg/dL — AB (ref 0–99)
TRIGLYCERIDES: 91 mg/dL (ref 0–149)
VLDL Cholesterol Cal: 18 mg/dL (ref 5–40)

## 2018-02-11 LAB — COMPREHENSIVE METABOLIC PANEL
ALBUMIN: 4.6 g/dL (ref 3.5–4.8)
ALK PHOS: 48 IU/L (ref 39–117)
ALT: 17 IU/L (ref 0–32)
AST: 20 IU/L (ref 0–40)
Albumin/Globulin Ratio: 1.9 (ref 1.2–2.2)
BUN / CREAT RATIO: 21 (ref 12–28)
BUN: 18 mg/dL (ref 8–27)
Bilirubin Total: 0.3 mg/dL (ref 0.0–1.2)
CO2: 28 mmol/L (ref 20–29)
Calcium: 9.8 mg/dL (ref 8.7–10.3)
Chloride: 100 mmol/L (ref 96–106)
Creatinine, Ser: 0.87 mg/dL (ref 0.57–1.00)
GFR calc Af Amer: 75 mL/min/{1.73_m2} (ref >59)
GFR calc non Af Amer: 65 mL/min/{1.73_m2} (ref >59)
GLUCOSE: 80 mg/dL (ref 65–99)
Globulin, Total: 2.4 g/dL (ref 1.5–4.5)
Potassium: 4.2 mmol/L (ref 3.5–5.2)
Sodium: 143 mmol/L (ref 134–144)
Total Protein: 7 g/dL (ref 6.0–8.5)

## 2018-02-11 LAB — TSH: TSH: 3.22 u[IU]/mL (ref 0.450–4.500)

## 2018-02-11 LAB — T4: T4 TOTAL: 6.6 ug/dL (ref 4.5–12.0)

## 2018-02-14 ENCOUNTER — Telehealth: Payer: Self-pay

## 2018-02-14 DIAGNOSIS — I8393 Asymptomatic varicose veins of bilateral lower extremities: Secondary | ICD-10-CM | POA: Diagnosis not present

## 2018-02-14 DIAGNOSIS — L578 Other skin changes due to chronic exposure to nonionizing radiation: Secondary | ICD-10-CM | POA: Diagnosis not present

## 2018-02-14 DIAGNOSIS — D1721 Benign lipomatous neoplasm of skin and subcutaneous tissue of right arm: Secondary | ICD-10-CM | POA: Diagnosis not present

## 2018-02-14 DIAGNOSIS — Z1283 Encounter for screening for malignant neoplasm of skin: Secondary | ICD-10-CM | POA: Diagnosis not present

## 2018-02-14 DIAGNOSIS — L821 Other seborrheic keratosis: Secondary | ICD-10-CM | POA: Diagnosis not present

## 2018-02-14 DIAGNOSIS — D1800 Hemangioma unspecified site: Secondary | ICD-10-CM | POA: Diagnosis not present

## 2018-02-14 DIAGNOSIS — L812 Freckles: Secondary | ICD-10-CM | POA: Diagnosis not present

## 2018-02-14 DIAGNOSIS — T07XXXA Unspecified multiple injuries, initial encounter: Secondary | ICD-10-CM | POA: Diagnosis not present

## 2018-02-14 DIAGNOSIS — D692 Other nonthrombocytopenic purpura: Secondary | ICD-10-CM | POA: Diagnosis not present

## 2018-02-14 DIAGNOSIS — L905 Scar conditions and fibrosis of skin: Secondary | ICD-10-CM | POA: Diagnosis not present

## 2018-02-14 DIAGNOSIS — D2261 Melanocytic nevi of right upper limb, including shoulder: Secondary | ICD-10-CM | POA: Diagnosis not present

## 2018-02-14 DIAGNOSIS — I781 Nevus, non-neoplastic: Secondary | ICD-10-CM | POA: Diagnosis not present

## 2018-02-14 NOTE — Telephone Encounter (Signed)
LMTCB 02/14/2018  Thanks,   -Mickel Baas

## 2018-02-14 NOTE — Telephone Encounter (Signed)
-----   Message from Tom Green, [place] sent at 02/13/2018  8:38 PM EDT ----- Thyroid tests back to normal and LDL cholesterol improving with reduced risk ratio since restarting the Lovastatin. Remainder of tests are normal. Recheck cholesterol in 4-6 months.

## 2018-02-14 NOTE — Telephone Encounter (Signed)
Patient advised as below. Patient verbalizes understanding and is in agreement with treatment plan.  

## 2018-02-27 DIAGNOSIS — F332 Major depressive disorder, recurrent severe without psychotic features: Secondary | ICD-10-CM | POA: Diagnosis not present

## 2018-02-27 DIAGNOSIS — F410 Panic disorder [episodic paroxysmal anxiety] without agoraphobia: Secondary | ICD-10-CM | POA: Diagnosis not present

## 2018-02-27 DIAGNOSIS — G4700 Insomnia, unspecified: Secondary | ICD-10-CM | POA: Diagnosis not present

## 2018-02-27 DIAGNOSIS — F411 Generalized anxiety disorder: Secondary | ICD-10-CM | POA: Diagnosis not present

## 2018-03-06 DIAGNOSIS — M62830 Muscle spasm of back: Secondary | ICD-10-CM | POA: Diagnosis not present

## 2018-03-06 DIAGNOSIS — G8929 Other chronic pain: Secondary | ICD-10-CM | POA: Diagnosis not present

## 2018-03-06 DIAGNOSIS — M546 Pain in thoracic spine: Secondary | ICD-10-CM | POA: Diagnosis not present

## 2018-03-24 DIAGNOSIS — Z23 Encounter for immunization: Secondary | ICD-10-CM | POA: Diagnosis not present

## 2018-04-18 DIAGNOSIS — R9389 Abnormal findings on diagnostic imaging of other specified body structures: Secondary | ICD-10-CM | POA: Diagnosis not present

## 2018-04-18 DIAGNOSIS — N763 Subacute and chronic vulvitis: Secondary | ICD-10-CM | POA: Diagnosis not present

## 2018-04-26 DIAGNOSIS — E782 Mixed hyperlipidemia: Secondary | ICD-10-CM | POA: Diagnosis not present

## 2018-06-14 ENCOUNTER — Other Ambulatory Visit: Payer: Self-pay | Admitting: Family Medicine

## 2018-06-14 DIAGNOSIS — K2100 Gastro-esophageal reflux disease with esophagitis, without bleeding: Secondary | ICD-10-CM

## 2018-06-14 DIAGNOSIS — K317 Polyp of stomach and duodenum: Secondary | ICD-10-CM

## 2018-06-22 DIAGNOSIS — F332 Major depressive disorder, recurrent severe without psychotic features: Secondary | ICD-10-CM | POA: Diagnosis not present

## 2018-06-22 DIAGNOSIS — F410 Panic disorder [episodic paroxysmal anxiety] without agoraphobia: Secondary | ICD-10-CM | POA: Diagnosis not present

## 2018-06-22 DIAGNOSIS — F411 Generalized anxiety disorder: Secondary | ICD-10-CM | POA: Diagnosis not present

## 2018-06-22 DIAGNOSIS — G4700 Insomnia, unspecified: Secondary | ICD-10-CM | POA: Diagnosis not present

## 2018-08-15 ENCOUNTER — Other Ambulatory Visit: Payer: Self-pay | Admitting: Family Medicine

## 2018-08-15 DIAGNOSIS — Z1231 Encounter for screening mammogram for malignant neoplasm of breast: Secondary | ICD-10-CM

## 2018-08-18 ENCOUNTER — Ambulatory Visit (INDEPENDENT_AMBULATORY_CARE_PROVIDER_SITE_OTHER): Payer: Medicare Other | Admitting: Family Medicine

## 2018-08-18 ENCOUNTER — Encounter: Payer: Self-pay | Admitting: Family Medicine

## 2018-08-18 VITALS — BP 108/60 | HR 76 | Temp 97.9°F | Resp 16 | Wt 127.0 lb

## 2018-08-18 DIAGNOSIS — R350 Frequency of micturition: Secondary | ICD-10-CM

## 2018-08-18 DIAGNOSIS — E782 Mixed hyperlipidemia: Secondary | ICD-10-CM

## 2018-08-18 DIAGNOSIS — K5909 Other constipation: Secondary | ICD-10-CM

## 2018-08-18 LAB — POCT URINALYSIS DIPSTICK
BILIRUBIN UA: NEGATIVE
GLUCOSE UA: NEGATIVE
KETONES UA: NEGATIVE
Nitrite, UA: NEGATIVE
Protein, UA: NEGATIVE
Spec Grav, UA: 1.01 (ref 1.010–1.025)
Urobilinogen, UA: 0.2 E.U./dL
pH, UA: 5 (ref 5.0–8.0)

## 2018-08-18 NOTE — Patient Instructions (Signed)
Chronic Constipation  Chronic constipation is a condition in which a person has three or fewer bowel movements a week, for three months or longer. This condition is especially common in older adults. The two main kinds of chronic constipation are secondary constipation and functional constipation. Secondary constipation results from another condition or a treatment. Functional constipation, also called primary or idiopathic constipation, is divided into three types:  Normal transit constipation. In this type, movement of stool through the colon (stool transit) occurs normally.  Slow transit constipation. In this type, stool moves slowly through the colon.  Outlet constipation or pelvic floor dysfunction. In this type, the nerves and muscles that empty the rectum do not work normally. What are the causes? Causes of secondary constipation may include:  Failing to drink enough fluid, eat enough food or fiber, or get physically active.  Pregnancy.  A tear in the anus (anal fissure).  Blockage in the bowel (bowel obstruction).  Narrowing of the bowel (bowel stricture).  Having a long-term medical condition, such as: ? Diabetes. ? Hypothyroidism. ? Multiple sclerosis. ? Parkinson disease. ? Stroke. ? Spinal cord injury. ? Dementia. ? Colon cancer. ? Inflammatory bowel disease (IBD). ? Iron-deficiency anemia. ? Outward collapse of the rectum (rectal prolapse). ? Hemorrhoids.  Taking certain medicines, including: ? Narcotics. These are a certain type of prescription pain medicine. ? Antacids. ? Iron supplements. ? Water pills (diuretics). ? Certain blood pressure medicines. ? Anti-seizure medicines. ? Antidepressants. ? Medicines for Parkinson disease. The cause of functional constipation is not known, but some conditions are associated with it. These conditions include:  Stress.  Problems in the nerves and muscles that control stool transit.  Weak or impaired pelvic floor  muscles. What increases the risk? You may be at higher risk for chronic constipation if you:  Are older than age 70.  Are female.  Live in a long-term care facility.  Do not get much exercise or physical activity (have a sedentary lifestyle).  Do not drink enough fluids.  Do not eat enough food, especially fiber.  Have a long-term disease.  Have a mental health disorder or eating disorder.  Take many medicines. What are the signs or symptoms? The main symptom of chronic constipation is having three or fewer bowel movements a week for several weeks. Other signs and symptoms may vary from person to person. These include:  Pushing hard (straining) to pass stool.  Painful bowel movements.  Having hard or lumpy stools.  Having lower belly discomfort, such as cramps or bloating.  Being unable to have a bowel movement when you feel the urge.  Feeling like you still need to pass stool after a bowel movement.  Feeling that you have something in your rectum that is blocking or preventing bowel movements.  Seeing blood on the toilet paper or in your stool.  Worsening confusion (in older adults). How is this diagnosed? This condition may be diagnosed based on:  Symptoms and medical history. You will be asked about your symptoms, lifestyle, diet, and any medicines that you are taking.  Physical exam. ? Your belly (abdomen) will be examined. ? A digital rectal exam may be done. For this exam, a health care provider places a lubricated, gloved finger into the rectum.  Other tests to check for any underlying causes of your constipation. These may be ordered if you have bleeding in your rectum, weight loss, or a family history of colon cancer. In these cases, you may have: ? Imaging studies of   the colon. These may include X-ray, ultrasound, or CT scan. ? Blood tests. ? A procedure to examine the inside of your colon (colonoscopy). ? More specialized tests to check:  Whether  your anal sphincter works well. This is a ring-shaped muscle that controls the closing of the anus.  How well food moves through your colon. ? Tests to measure the nerve signal in your pelvic floor muscles (electromyography). How is this treated? Treatment for chronic constipation depends on the cause. Most often, treatment starts with:  Being more active and getting regular exercise.  Drinking more fluids.  Adding fiber to your diet. Sources of fiber include fruits, vegetables, whole grains, and fiber supplements.  Using medicines such as stool softeners or medicines that increase contractions in your digestive system (pro-motility agents).  Training your pelvic muscles with biofeedback.  Surgery, if there is obstruction. Treatment for secondary chronic constipation depends on the underlying condition. You may need to:  Stop or change some medicines if they cause constipation.  Use a fiber supplement (bulk laxative) or stool softener.  Use prescription laxative. This works by absorbing water into your colon (osmotic laxative). You may also need to see a specialist who treats conditions of the digestive system (gastroenterologist). Follow these instructions at home:   Take over-the-counter and prescription medicines only as told by your health care provider.  If you are taking a laxative, take it as told by your health care provider.  Eat a balanced diet that includes enough fiber. Ask your health care provider to recommend a diet that is right for you.  Drink clear fluids, especially water. Avoid drinking alcohol, caffeine, and soda.  Drink enough fluid to keep your urine pale yellow.  Get some physical activity every day. Ask your health care provider what physical activities are safe for you.  Get colon cancer screenings as told by your health care provider.  Keep all follow-up visits as told by your health care provider. This is important. Contact a health care  provider if:  You are having three or fewer bowel movements a week.  Your stools are hard or lumpy.  You notice blood on the toilet paper or in your stool after you have a bowel movement.  You have unexplained weight loss.  You have rectum (rectal) pain.  You have stool leakage.  You experience nausea or vomiting. Get help right away if:  You have rectal bleeding or you pass blood clots.  You have severe rectal pain.  You have body tissue that pushes out (protrudes) from your anus.  You have severe pain or bloating (distension) in your abdomen.  You have vomiting that you cannot control. Summary  Chronic constipation is a condition in which a person has three or fewer bowel movements a week, for three months or longer.  You may have a higher risk for this condition if you are an older adult, or if you do not drink enough water or get enough physical activity (are sedentary).  Treatment for this condition depends on the cause. Most treatments for chronic constipation include adding fiber to your diet, drinking more fluids, and getting more physical activity. You may also need to treat any underlying medical conditions or stop or change certain medicines if they cause constipation.  If lifestyle changes do not relieve constipation, your health care provider may recommend taking a laxative. This information is not intended to replace advice given to you by your health care provider. Make sure you discuss any questions you   have with your health care provider. Document Released: 12/28/2016 Document Revised: 02/15/2017 Document Reviewed: 02/15/2017 [employer] Interactive Patient Education  2019 [employer]  

## 2018-08-18 NOTE — Progress Notes (Signed)
Patient: Ann Young Female    DOB: 06-07-1941   78 y.o.   MRN: [id number] Visit Date: 08/18/2018  Today's Provider: Vernie Murders, PA   Chief Complaint  Patient presents with  . Hyperlipidemia  . Urinary Tract Infection    Symptoms started about a week ago.   Subjective:     Hyperlipidemia  This is a chronic problem. The problem is controlled. Recent lipid tests were reviewed and are high. Current antihyperlipidemic treatment includes statins. The current treatment provides moderate improvement of lipids. There are no compliance problems.   Urinary Tract Infection   This is a new problem. The current episode started in the past 7 days. The problem has been unchanged. Associated symptoms include frequency, hesitancy and urgency. Pertinent negatives include no chills, discharge, flank pain, hematuria, nausea, possible pregnancy, sweats or vomiting.   Lab Results  Component Value Date   CHOL 209 (H) 02/10/2018   CHOL 260 (H) 11/10/2017   CHOL 222 (H) 07/04/2017   Lab Results  Component Value Date   HDL 62 02/10/2018   HDL 57 11/10/2017   HDL 73 07/04/2017   Lab Results  Component Value Date   LDLCALC 129 (H) 02/10/2018   LDLCALC 183 (H) 11/10/2017   LDLCALC 133 (H) 07/04/2017   Lab Results  Component Value Date   TRIG 91 02/10/2018   TRIG 98 11/10/2017   TRIG 80 07/04/2017   Lab Results  Component Value Date   CHOLHDL 3.4 02/10/2018   CHOLHDL 4.6 (H) 11/10/2017   CHOLHDL 3.0 07/04/2017   Past Medical History:  Diagnosis Date  . Anxiety   . Arthritis   . Asthma   . Cancer ([place])    colorectal  . Depression   . Dyspepsia   . Elevated cholesterol   . GERD (gastroesophageal reflux disease)   . History of hiatal hernia   . Peptic ulcer disease    Past Surgical History:  Procedure Laterality Date  . BREAST BIOPSY Left 2000   benign  . BREAST CYST ASPIRATION    . CARDIAC CATHETERIZATION  2015   WNL  . CATARACT EXTRACTION Bilateral 2013  .  CATARACT EXTRACTION, BILATERAL Bilateral 2013   Dr. Willy Eddy  . COLON SURGERY    . COLONOSCOPY WITH PROPOFOL N/A 02/04/2016   Procedure: COLONOSCOPY WITH PROPOFOL;  Surgeon: Manya Silvas, MD;  Location: [facility] ENDOSCOPY;  Service: Endoscopy;  Laterality: N/A;  . ESOPHAGOGASTRODUODENOSCOPY (EGD) WITH PROPOFOL N/A 02/04/2016   Procedure: ESOPHAGOGASTRODUODENOSCOPY (EGD) WITH PROPOFOL;  Surgeon: Manya Silvas, MD;  Location: [facility] ENDOSCOPY;  Service: Endoscopy;  Laterality: N/A;  . HYSTEROSCOPY W/D&C N/A 02/04/2017   Procedure: DILATATION AND CURETTAGE /HYSTEROSCOPY;  Surgeon: Schermerhorn, Gwen Her, MD;  Location: [facility] ORS;  Service: Gynecology;  Laterality: N/A;  . resection of colorectal cancer without sequela  1999  . resection of colorectal cancer without sequela    . TUBAL LIGATION  1976   Family History  Problem Relation Age of Onset  . Cirrhosis Sister   . Colon polyps Sister   . Leukemia Mother   . Depression Mother   . Heart disease Father   . Depression Father   . Bipolar disorder Daughter   . Prostate cancer Neg Hx   . Kidney cancer Neg Hx   . Breast cancer Neg Hx    Allergies  Allergen Reactions  . Clarithromycin Other (See Comments)    Questionable Biaxin vs Flagyl with chest discomfort & irregular heartbeat (most  likely thought to be related to the Biaxin)  . Fesoterodine     Other reaction(s): Other (See Comments) weakness  . Ibuprofen Other (See Comments)    To prevent swelling,increase hr/bp Other reaction(s): Other (See Comments) To prevent swelling,increase hr/bp  . Lamictal [Lamotrigine]     Hair thinning  . Lipitor  [Atorvastatin] Other (See Comments)    Mayalgia  . Metronidazole Other (See Comments)    Questionable Biaxin vs Flagyl with chest discomfort & irregular heartbeat (most likely thought to be related to the Biaxin)  . Other Other (See Comments)    decongestants  . Penicillins Nausea Only  . Simvastatin Other (See Comments)    Hari Loss  .  Sulfa Antibiotics     Other reaction(s): Unknown Other reaction(s): Unknown Patient cant remember  . Toviaz  [Fesoterodine Fumarate Er] Other (See Comments)    weakness    Current Outpatient Medications:  .  alendronate (FOSAMAX) 70 MG tablet, Take 1 tablet (70 mg total) by mouth once a week. Reported on 08/18/2015, Disp: 12 tablet, Rfl: 3 .  aspirin 81 MG tablet, Take 81 mg by mouth daily., Disp: , Rfl:  .  busPIRone (BUSPAR) 10 MG tablet, , Disp: , Rfl:  .  Calcium Carb-Cholecalciferol 600-200 MG-UNIT TABS, Take 1 tablet by mouth 2 (two) times daily., Disp: , Rfl:  .  cetirizine (ZYRTEC) 10 MG tablet, TAKE ONE TABLET BY MOUTH ONCE DAILY FOR ALLERGIES, Disp: 90 tablet, Rfl: 3 .  Cholecalciferol (VITAMIN D PO), Take by mouth., Disp: , Rfl:  .  fluticasone (FLONASE) 50 MCG/ACT nasal spray, Place 1 spray into both nostrils daily. , Disp: , Rfl:  .  lovastatin (MEVACOR) 40 MG tablet, Take 1 tablet (40 mg total) by mouth at bedtime., Disp: 90 tablet, Rfl: 3 .  mirtazapine (REMERON) 30 MG tablet, , Disp: , Rfl:  .  Omega-3 Fatty Acids (FISH OIL PO), Take 1 capsule by mouth daily. , Disp: , Rfl:  .  pantoprazole (PROTONIX) 40 MG tablet, TAKE 1 TABLET BY MOUTH ONCE DAILY, Disp: 90 tablet, Rfl: 0 .  triamcinolone lotion (KENALOG) 0.1 %, Apply 1 application topically daily as needed. , Disp: , Rfl:  .  Cranberry 125 MG TABS, Take 1 capsule by mouth daily., Disp: , Rfl:  .  Multiple Vitamins-Minerals (MULTIVITAMIN PO), Take 1 tablet by mouth daily. , Disp: , Rfl:  .  Red Yeast Rice Extract (RED YEAST RICE PO), Take by mouth., Disp: , Rfl:   Review of Systems  Constitutional: Positive for fatigue. Negative for activity change, appetite change, chills, diaphoresis, fever and unexpected weight change.  Respiratory: Negative.   Cardiovascular: Negative.   Gastrointestinal: Positive for constipation (Chronic issue). Negative for abdominal distention, abdominal pain, anal bleeding, blood in stool,  diarrhea, nausea, rectal pain and vomiting.  Genitourinary: Positive for frequency, hesitancy and urgency. Negative for decreased urine volume, difficulty urinating, dysuria, flank pain, hematuria, menstrual problem, vaginal bleeding, vaginal discharge and vaginal pain.  Musculoskeletal: Positive for back pain (Left side. ).  Neurological: Negative for dizziness, light-headedness and headaches.   Social History   Tobacco Use  . Smoking status: Never Smoker  . Smokeless tobacco: Never Used  Substance Use Topics  . Alcohol use: No    Alcohol/week: 0.0 standard drinks     Objective:   BP 108/60 (BP Location: Right Arm, Patient Position: Sitting, Cuff Size: Normal)   Pulse 76   Temp 97.9 F (36.6 C) (Oral)   Resp 16  Wt 127 lb (57.6 kg)   BMI 24.80 kg/m  Vitals:   08/18/18 0906  BP: 108/60  Pulse: 76  Resp: 16  Temp: 97.9 F (36.6 C)  TempSrc: Oral  Weight: 127 lb (57.6 kg)   Physical Exam Constitutional:      General: She is not in acute distress.    Appearance: She is well-developed.  HENT:     Head: Normocephalic and atraumatic.     Right Ear: Hearing normal.     Left Ear: Hearing normal.     Nose: Nose normal.  Eyes:     General: Lids are normal. No scleral icterus.       Right eye: No discharge.        Left eye: No discharge.     Conjunctiva/sclera: Conjunctivae normal.  Cardiovascular:     Rate and Rhythm: Normal rate and regular rhythm.     Heart sounds: Normal heart sounds.  Pulmonary:     Effort: Pulmonary effort is normal. No respiratory distress.     Breath sounds: Normal breath sounds.  Abdominal:     General: Bowel sounds are normal.     Palpations: Abdomen is soft.  Musculoskeletal: Normal range of motion.  Skin:    Findings: No lesion or rash.  Neurological:     Mental Status: She is alert and oriented to person, place, and time.  Psychiatric:        Speech: Speech normal.        Behavior: Behavior normal.        Thought Content: Thought  content normal.       Assessment & Plan    1. Mixed hyperlipidemia Tolerating the Lovastatin 40 mg qd without side effects. Continue low fat diet and get follow up labs. - CBC with Differential/Platelet - Comprehensive metabolic panel - Lipid panel  2. Frequency of urination Return of frequent urination the past 2-3 weeks. No hematuria. Urinalysis showed 2+ bacteria with few WBC's. Will check labs for renal function and C&S for infection. Limit bedtime fluids. May need trial of OAB meds if no sign of infection.  - POCT urinalysis dipstick - CBC with Differential/Platelet - Comprehensive metabolic panel  3. Constipation, chronic Having BM every 3-4 days. May use stool softener and/or Miralax prn. Given chronic constipation handout. May need to consider Movantik.     Vernie Murders, PA  [facility]

## 2018-08-19 LAB — CBC WITH DIFFERENTIAL/PLATELET
BASOS ABS: 0 10*3/uL (ref 0.0–0.2)
BASOS: 1 %
EOS (ABSOLUTE): 0.1 10*3/uL (ref 0.0–0.4)
Eos: 2 %
Hematocrit: 37.9 % (ref 34.0–46.6)
Hemoglobin: 13 g/dL (ref 11.1–15.9)
IMMATURE GRANS (ABS): 0 10*3/uL (ref 0.0–0.1)
IMMATURE GRANULOCYTES: 0 %
LYMPHS: 33 %
Lymphocytes Absolute: 1.3 10*3/uL (ref 0.7–3.1)
MCH: 32 pg (ref 26.6–33.0)
MCHC: 34.3 g/dL (ref 31.5–35.7)
MCV: 93 fL (ref 79–97)
MONOS ABS: 0.3 10*3/uL (ref 0.1–0.9)
Monocytes: 9 %
NEUTROS ABS: 2.2 10*3/uL (ref 1.4–7.0)
NEUTROS PCT: 55 %
PLATELETS: 167 10*3/uL (ref 150–450)
RBC: 4.06 x10E6/uL (ref 3.77–5.28)
RDW: 11.4 % — AB (ref 11.7–15.4)
WBC: 4 10*3/uL (ref 3.4–10.8)

## 2018-08-19 LAB — COMPREHENSIVE METABOLIC PANEL
A/G RATIO: 2.2 (ref 1.2–2.2)
ALK PHOS: 48 IU/L (ref 39–117)
ALT: 12 IU/L (ref 0–32)
AST: 17 IU/L (ref 0–40)
Albumin: 4.7 g/dL (ref 3.7–4.7)
BUN / CREAT RATIO: 20 (ref 12–28)
BUN: 16 mg/dL (ref 8–27)
Bilirubin Total: 0.3 mg/dL (ref 0.0–1.2)
CO2: 26 mmol/L (ref 20–29)
CREATININE: 0.82 mg/dL (ref 0.57–1.00)
Calcium: 9.5 mg/dL (ref 8.7–10.3)
Chloride: 100 mmol/L (ref 96–106)
GFR calc Af Amer: 81 mL/min/{1.73_m2} (ref >59)
GFR calc non Af Amer: 70 mL/min/{1.73_m2} (ref >59)
GLUCOSE: 82 mg/dL (ref 65–99)
Globulin, Total: 2.1 g/dL (ref 1.5–4.5)
Potassium: 4.2 mmol/L (ref 3.5–5.2)
Sodium: 141 mmol/L (ref 134–144)
Total Protein: 6.8 g/dL (ref 6.0–8.5)

## 2018-08-19 LAB — LIPID PANEL
CHOLESTEROL TOTAL: 201 mg/dL — AB (ref 100–199)
Chol/HDL Ratio: 3 ratio (ref 0.0–4.4)
HDL: 66 mg/dL (ref >39)
LDL Calculated: 117 mg/dL — ABNORMAL HIGH (ref 0–99)
TRIGLYCERIDES: 92 mg/dL (ref 0–149)
VLDL CHOLESTEROL CAL: 18 mg/dL (ref 5–40)

## 2018-08-22 ENCOUNTER — Telehealth: Payer: Self-pay

## 2018-08-22 LAB — CULTURE, URINE COMPREHENSIVE

## 2018-08-22 LAB — SPECIMEN STATUS REPORT

## 2018-08-22 NOTE — Telephone Encounter (Signed)
Patient advised as directed below. 

## 2018-08-22 NOTE — Telephone Encounter (Signed)
-----   Message from [place], [place] sent at 08/21/2018  6:32 PM EDT ----- Blood tests essentially normal except LDL cholesterol still a little elevated. Seems the Lovastatin is working - continue the present dosage. Preliminary urine culture report shows some mixed bacteria. May need antibiotic pending final report. Continue to drink extra water in diet to flush out urinary tract.

## 2018-08-23 ENCOUNTER — Telehealth: Payer: Self-pay

## 2018-08-23 MED ORDER — DOXYCYCLINE HYCLATE 100 MG PO TABS: 100.0000 mg | ORAL_TABLET | Freq: Two times a day (BID) | ORAL | 0 refills | Status: DC

## 2018-08-23 NOTE — Telephone Encounter (Signed)
-----   Message from [place], [place] sent at 08/22/2018  5:48 PM EDT ----- Only beta hemolytic streptococcus isolated on urine culture. Recommend Doxycycline 100 mg BID #14. Drink extra water in diet (6 eight ounce glasses daily). Recheck as needed.

## 2018-08-23 NOTE — Telephone Encounter (Signed)
Patient was advised and prescription sent to [facility] [address]. KW

## 2018-08-25 ENCOUNTER — Other Ambulatory Visit: Payer: Self-pay

## 2018-08-25 ENCOUNTER — Ambulatory Visit
Admission: RE | Admit: 2018-08-25 | Discharge: 2018-08-25 | Disposition: A | Discharge status: Home / Self Care | Payer: Medicare Other | Source: Ambulatory Visit | Attending: Family Medicine | Admitting: Family Medicine

## 2018-08-25 DIAGNOSIS — Z1231 Encounter for screening mammogram for malignant neoplasm of breast: Secondary | ICD-10-CM | POA: Diagnosis not present

## 2018-08-26 ENCOUNTER — Telehealth: Payer: Self-pay

## 2018-08-26 NOTE — Telephone Encounter (Signed)
Patient advised as directed below. 

## 2018-08-26 NOTE — Telephone Encounter (Signed)
-----   Message from [place], [place] sent at 08/25/2018  5:04 PM EDT ----- Normal mammogram. No sign of malignancy.

## 2018-09-29 ENCOUNTER — Other Ambulatory Visit: Payer: Self-pay

## 2018-09-29 DIAGNOSIS — K317 Polyp of stomach and duodenum: Secondary | ICD-10-CM

## 2018-09-29 DIAGNOSIS — K2100 Gastro-esophageal reflux disease with esophagitis, without bleeding: Secondary | ICD-10-CM

## 2018-09-29 MED ORDER — PANTOPRAZOLE SODIUM 40 MG PO TBEC: 40.0000 mg | DELAYED_RELEASE_TABLET | Freq: Every day | ORAL | 0 refills | Status: DC

## 2018-10-23 DIAGNOSIS — F411 Generalized anxiety disorder: Secondary | ICD-10-CM | POA: Diagnosis not present

## 2018-10-23 DIAGNOSIS — F410 Panic disorder [episodic paroxysmal anxiety] without agoraphobia: Secondary | ICD-10-CM | POA: Diagnosis not present

## 2018-10-23 DIAGNOSIS — G4700 Insomnia, unspecified: Secondary | ICD-10-CM | POA: Diagnosis not present

## 2018-10-23 DIAGNOSIS — F332 Major depressive disorder, recurrent severe without psychotic features: Secondary | ICD-10-CM | POA: Diagnosis not present

## 2018-10-31 DIAGNOSIS — N898 Other specified noninflammatory disorders of vagina: Secondary | ICD-10-CM | POA: Diagnosis not present

## 2018-10-31 DIAGNOSIS — N3281 Overactive bladder: Secondary | ICD-10-CM | POA: Diagnosis not present

## 2018-10-31 DIAGNOSIS — R300 Dysuria: Secondary | ICD-10-CM | POA: Diagnosis not present

## 2018-10-31 DIAGNOSIS — N8111 Cystocele, midline: Secondary | ICD-10-CM | POA: Diagnosis not present

## 2018-11-02 ENCOUNTER — Telehealth: Payer: Self-pay | Admitting: Family Medicine

## 2018-11-02 ENCOUNTER — Other Ambulatory Visit: Payer: Self-pay | Admitting: Family Medicine

## 2018-11-02 ENCOUNTER — Other Ambulatory Visit: Payer: Self-pay

## 2018-11-02 DIAGNOSIS — Z889 Allergy status to unspecified drugs, medicaments and biological substances status: Secondary | ICD-10-CM

## 2018-11-02 DIAGNOSIS — E782 Mixed hyperlipidemia: Secondary | ICD-10-CM

## 2018-11-02 MED ORDER — LOVASTATIN 40 MG PO TABS: 40.0000 mg | ORAL_TABLET | Freq: Every day | ORAL | 3 refills | Status: DC

## 2018-11-02 MED ORDER — CETIRIZINE HCL 10 MG PO TABS: ORAL_TABLET | ORAL | 3 refills | Status: DC

## 2018-11-02 NOTE — Telephone Encounter (Signed)
Please review

## 2018-11-02 NOTE — Telephone Encounter (Signed)
Sent refills to [facility].

## 2018-11-02 NOTE — Telephone Encounter (Signed)
Pt needs refill on   Lovastatin 40 mg  Cetirizine 10 mg  [facility]

## 2018-11-14 ENCOUNTER — Telehealth: Payer: Self-pay

## 2018-11-14 ENCOUNTER — Other Ambulatory Visit: Payer: Self-pay

## 2018-11-14 DIAGNOSIS — M810 Age-related osteoporosis without current pathological fracture: Secondary | ICD-10-CM

## 2018-11-14 MED ORDER — ALENDRONATE SODIUM 70 MG PO TABS: 70.0000 mg | ORAL_TABLET | ORAL | 3 refills | Status: DC

## 2018-11-24 DIAGNOSIS — S46212A Strain of muscle, fascia and tendon of other parts of biceps, left arm, initial encounter: Secondary | ICD-10-CM | POA: Diagnosis not present

## 2018-11-24 DIAGNOSIS — G8929 Other chronic pain: Secondary | ICD-10-CM | POA: Diagnosis not present

## 2018-11-24 DIAGNOSIS — M25512 Pain in left shoulder: Secondary | ICD-10-CM | POA: Diagnosis not present

## 2018-11-24 DIAGNOSIS — M62830 Muscle spasm of back: Secondary | ICD-10-CM | POA: Diagnosis not present

## 2018-11-24 DIAGNOSIS — W07XXXA Fall from chair, initial encounter: Secondary | ICD-10-CM | POA: Diagnosis not present

## 2018-11-24 DIAGNOSIS — M546 Pain in thoracic spine: Secondary | ICD-10-CM | POA: Diagnosis not present

## 2018-11-30 DIAGNOSIS — N3281 Overactive bladder: Secondary | ICD-10-CM | POA: Diagnosis not present

## 2018-11-30 DIAGNOSIS — N8111 Cystocele, midline: Secondary | ICD-10-CM | POA: Diagnosis not present

## 2018-12-13 DIAGNOSIS — N8111 Cystocele, midline: Secondary | ICD-10-CM | POA: Diagnosis not present

## 2018-12-25 ENCOUNTER — Telehealth: Payer: Self-pay | Admitting: Family Medicine

## 2018-12-25 DIAGNOSIS — K317 Polyp of stomach and duodenum: Secondary | ICD-10-CM

## 2018-12-25 DIAGNOSIS — K2100 Gastro-esophageal reflux disease with esophagitis, without bleeding: Secondary | ICD-10-CM

## 2018-12-25 MED ORDER — PANTOPRAZOLE SODIUM 40 MG PO TBEC: 40.0000 mg | DELAYED_RELEASE_TABLET | Freq: Every day | ORAL | 0 refills | Status: DC

## 2018-12-25 NOTE — Telephone Encounter (Signed)
Pt needs a refill on   Pantoprazole 40 mg  [facility]  [address]  Thanks [employer]

## 2019-01-03 DIAGNOSIS — R102 Pelvic and perineal pain: Secondary | ICD-10-CM | POA: Diagnosis not present

## 2019-01-03 DIAGNOSIS — N8111 Cystocele, midline: Secondary | ICD-10-CM | POA: Diagnosis not present

## 2019-01-12 ENCOUNTER — Other Ambulatory Visit: Payer: Self-pay

## 2019-01-16 DIAGNOSIS — R102 Pelvic and perineal pain: Secondary | ICD-10-CM | POA: Diagnosis not present

## 2019-02-15 DIAGNOSIS — N8111 Cystocele, midline: Secondary | ICD-10-CM | POA: Diagnosis not present

## 2019-02-20 DIAGNOSIS — F410 Panic disorder [episodic paroxysmal anxiety] without agoraphobia: Secondary | ICD-10-CM | POA: Diagnosis not present

## 2019-02-20 DIAGNOSIS — F411 Generalized anxiety disorder: Secondary | ICD-10-CM | POA: Diagnosis not present

## 2019-02-20 DIAGNOSIS — G4700 Insomnia, unspecified: Secondary | ICD-10-CM | POA: Diagnosis not present

## 2019-02-20 DIAGNOSIS — F332 Major depressive disorder, recurrent severe without psychotic features: Secondary | ICD-10-CM | POA: Diagnosis not present

## 2019-02-26 DIAGNOSIS — L219 Seborrheic dermatitis, unspecified: Secondary | ICD-10-CM | POA: Diagnosis not present

## 2019-03-02 DIAGNOSIS — H43813 Vitreous degeneration, bilateral: Secondary | ICD-10-CM | POA: Diagnosis not present

## 2019-03-08 DIAGNOSIS — Z85038 Personal history of other malignant neoplasm of large intestine: Secondary | ICD-10-CM | POA: Diagnosis not present

## 2019-03-08 DIAGNOSIS — R1310 Dysphagia, unspecified: Secondary | ICD-10-CM | POA: Diagnosis not present

## 2019-03-13 DIAGNOSIS — Z23 Encounter for immunization: Secondary | ICD-10-CM | POA: Diagnosis not present

## 2019-03-26 DIAGNOSIS — L570 Actinic keratosis: Secondary | ICD-10-CM | POA: Diagnosis not present

## 2019-03-26 DIAGNOSIS — L821 Other seborrheic keratosis: Secondary | ICD-10-CM | POA: Diagnosis not present

## 2019-03-26 DIAGNOSIS — L218 Other seborrheic dermatitis: Secondary | ICD-10-CM | POA: Diagnosis not present

## 2019-04-02 ENCOUNTER — Other Ambulatory Visit: Payer: Self-pay | Admitting: Family Medicine

## 2019-04-02 ENCOUNTER — Telehealth: Payer: Self-pay | Admitting: Family Medicine

## 2019-04-02 DIAGNOSIS — K2100 Gastro-esophageal reflux disease with esophagitis, without bleeding: Secondary | ICD-10-CM

## 2019-04-02 DIAGNOSIS — K317 Polyp of stomach and duodenum: Secondary | ICD-10-CM

## 2019-04-02 MED ORDER — PANTOPRAZOLE SODIUM 40 MG PO TBEC: 40.0000 mg | DELAYED_RELEASE_TABLET | Freq: Every day | ORAL | 0 refills | Status: DC

## 2019-04-02 NOTE — Telephone Encounter (Signed)
Pt needing a refill on: pantoprazole (PROTONIX) 40 MG tablet  Please fill at:  [facility] ([facility]), [place] [address] [phone] (Phone) [phone] (Fax)   Thanks, [employer]

## 2019-04-02 NOTE — Telephone Encounter (Signed)
Due for follow up appointment and fasting labs in the next 2 months. Has an endoscopic exam scheduled by Dr. Alice Reichert (GI) in January to recheck GERD and history of PUD.

## 2019-04-02 NOTE — Telephone Encounter (Signed)
Pt needing to know if she needs to come back to have labs done.  Please advise.  Thanks, [employer]

## 2019-04-03 NOTE — Telephone Encounter (Signed)
Was patient contacted to relay this response or do I need to do something?

## 2019-04-03 NOTE — Telephone Encounter (Signed)
Advised and appointment made 

## 2019-04-23 ENCOUNTER — Other Ambulatory Visit: Payer: Self-pay

## 2019-04-23 ENCOUNTER — Ambulatory Visit (INDEPENDENT_AMBULATORY_CARE_PROVIDER_SITE_OTHER): Payer: Medicare Other | Admitting: Family Medicine

## 2019-04-23 ENCOUNTER — Encounter: Payer: Self-pay | Admitting: Family Medicine

## 2019-04-23 ENCOUNTER — Telehealth: Payer: Self-pay | Admitting: Family Medicine

## 2019-04-23 VITALS — BP 118/70 | HR 71 | Temp 96.8°F | Wt 127.0 lb

## 2019-04-23 DIAGNOSIS — F418 Other specified anxiety disorders: Secondary | ICD-10-CM

## 2019-04-23 DIAGNOSIS — K5909 Other constipation: Secondary | ICD-10-CM | POA: Diagnosis not present

## 2019-04-23 DIAGNOSIS — K2100 Gastro-esophageal reflux disease with esophagitis, without bleeding: Secondary | ICD-10-CM | POA: Diagnosis not present

## 2019-04-23 DIAGNOSIS — E782 Mixed hyperlipidemia: Secondary | ICD-10-CM

## 2019-04-23 DIAGNOSIS — Z1159 Encounter for screening for other viral diseases: Secondary | ICD-10-CM

## 2019-04-23 NOTE — Telephone Encounter (Signed)
Covid test ordered and patient given instructions for testing, quarantine guidelines and symptom monitoring.

## 2019-04-23 NOTE — Telephone Encounter (Signed)
LMTCB ED 

## 2019-04-23 NOTE — Telephone Encounter (Signed)
Pt needing a call back because the person she spoke to [place] about tested positive to [place].  Please call pt back on cell phone to let her know if she needs to be tested.  Thanks, [employer]

## 2019-04-23 NOTE — Telephone Encounter (Signed)
Pt returned call. Thanks TNP °

## 2019-04-23 NOTE — Progress Notes (Signed)
Ann Young  MRN: [id number] DOB: 07-19-1940  Subjective:  HPI   GERD-Patient is followed by Dr Aundria Mems from GI.  She is scheduled to have endoscopic exam on 07/15/19.  Depression- Depression screen [facility] 2/9 07/04/2017 01/17/2017 08/13/2016 08/13/2016  Decreased Interest 2 1 0 0  Down, Depressed, Hopeless 2 1 0 0  PHQ - 2 Score 4 2 0 0  Altered sleeping 0 0 - 1  Tired, decreased energy 2 1 - 3  Change in appetite 2 0 - 0  Feeling bad or failure about yourself  0 0 - 0  Trouble concentrating 0 0 - 0  Moving slowly or fidgety/restless 1 1 - 1  Suicidal thoughts 0 0 - 0  PHQ-9 Score 9 4 - 5  Difficult doing work/chores Not difficult at all - - -   Hyperlipidemia Lab Results  Component Value Date   CHOL 201 (H) 08/18/2018   HDL 66 08/18/2018   LDLCALC 117 (H) 08/18/2018   TRIG 92 08/18/2018   CHOLHDL 3.0 08/18/2018     Patient Active Problem List   Diagnosis Date Noted  . Thickened endometrium 07/04/2017  . Ketonuria 09/03/2016  . Chronic reflux esophagitis 07/20/2016  . Multiple gastric polyps 07/20/2016  . History of allergy 08/18/2015  . Osteoporosis 05/07/2015  . Constipation, chronic 05/07/2015  . History of colorectal cancer 05/07/2015  . Depression with anxiety 05/07/2015  . Arthritis 05/07/2015  . Mixed hyperlipidemia 05/07/2015  . GERD with stricture 05/07/2015  . Low back pain 02/10/2015  . Problems influencing health status 10/28/2014  . Malignant neoplasm ([place]) 10/28/2014  . History of colonic polyps 10/28/2014  . Anxiety and depression 10/28/2014  . Acid reflux 10/28/2014  . History of malignant neoplasm of large intestine 10/28/2014  . 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 Gastric ulcer 10/28/2014  . 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 Awareness of heartbeats 03/25/2014    Past Medical History:  Diagnosis Date  . Anxiety   . Arthritis   . Asthma   . Cancer ([place])    colorectal  .  Depression   . Dyspepsia   . Elevated cholesterol   . GERD (gastroesophageal reflux disease)   . History of hiatal hernia   . Peptic ulcer disease    Past Surgical History:  Procedure Laterality Date  . BREAST BIOPSY Left 2000   benign  . BREAST CYST ASPIRATION    . CARDIAC CATHETERIZATION  2015   WNL  . CATARACT EXTRACTION Bilateral 2013  . CATARACT EXTRACTION, BILATERAL Bilateral 2013   Dr. Willy Eddy  . COLON SURGERY    . COLONOSCOPY WITH PROPOFOL N/A 02/04/2016   Procedure: COLONOSCOPY WITH PROPOFOL;  Surgeon: Manya Silvas, MD;  Location: [facility] ENDOSCOPY;  Service: Endoscopy;  Laterality: N/A;  . ESOPHAGOGASTRODUODENOSCOPY (EGD) WITH PROPOFOL N/A 02/04/2016   Procedure: ESOPHAGOGASTRODUODENOSCOPY (EGD) WITH PROPOFOL;  Surgeon: Manya Silvas, MD;  Location: [facility] ENDOSCOPY;  Service: Endoscopy;  Laterality: N/A;  . HYSTEROSCOPY W/D&C N/A 02/04/2017   Procedure: DILATATION AND CURETTAGE /HYSTEROSCOPY;  Surgeon: Schermerhorn, Gwen Her, MD;  Location: [facility] ORS;  Service: Gynecology;  Laterality: N/A;  . resection of colorectal cancer without sequela  1999  . resection of colorectal cancer without sequela    . TUBAL LIGATION  1976   Family History  Problem Relation Age of Onset  . Cirrhosis Sister   . Colon polyps Sister   . Leukemia Mother   . Depression Mother   . Heart disease  Father   . Depression Father   . Bipolar disorder Daughter   . Prostate cancer Neg Hx   . Kidney cancer Neg Hx   . Breast cancer Neg Hx    Social History   Socioeconomic History  . Marital status: Widowed    Spouse name: Not on file  . Number of children: 5  . Years of education: Not on file  . Highest education level: Not on file  Occupational History  . Not on file  Social Needs  . Financial resource strain: Not on file  . Food insecurity    Worry: Not on file    Inability: Not on file  . Transportation needs    Medical: Not on file    Non-medical: Not on file  Tobacco Use  .  Smoking status: Never Smoker  . Smokeless tobacco: Never Used  Substance and Sexual Activity  . Alcohol use: No    Alcohol/week: 0.0 standard drinks  . Drug use: No  . Sexual activity: Never  Lifestyle  . Physical activity    Days per week: Not on file    Minutes per session: Not on file  . Stress: Rather much  Relationships  . Social Herbalist on phone: Not on file    Gets together: Not on file    Attends religious service: Not on file    Active member of club or organization: Not on file    Attends meetings of clubs or organizations: Not on file    Relationship status: Not on file  . Intimate partner violence    Fear of current or ex partner: Not on file    Emotionally abused: Not on file    Physically abused: Not on file    Forced sexual activity: Not on file  Other Topics Concern  . Not on file  Social History Narrative   Lives alone with her dogs. One son lives next door and one within a few miles.       Outpatient Encounter Medications as of 04/23/2019  Medication Sig  . alendronate (FOSAMAX) 70 MG tablet Take 1 tablet (70 mg total) by mouth once a week. Reported on 08/18/2015  . Ascorbic Acid (VITAMIN C) 1000 MG tablet Take 1,000 mg by mouth daily.  Marland Kitchen aspirin 81 MG tablet Take 81 mg by mouth daily.  . busPIRone (BUSPAR) 10 MG tablet   . Calcium Carb-Cholecalciferol 600-200 MG-UNIT TABS Take 1 tablet by mouth 2 (two) times daily.  . cetirizine (ZYRTEC) 10 MG tablet TAKE ONE TABLET BY MOUTH ONCE DAILY FOR ALLERGIES  . Cholecalciferol (VITAMIN D PO) Take by mouth.  . fluticasone (FLONASE) 50 MCG/ACT nasal spray Place 1 spray into both nostrils daily.   Marland Kitchen lovastatin (MEVACOR) 40 MG tablet Take 1 tablet (40 mg total) by mouth at bedtime.  . mirtazapine (REMERON) 30 MG tablet   . Omega-3 Fatty Acids (FISH OIL PO) Take 1 capsule by mouth daily.   . pantoprazole (PROTONIX) 40 MG tablet Take 1 tablet (40 mg total) by mouth daily.  Marland Kitchen triamcinolone lotion (KENALOG)  0.1 % Apply 1 application topically daily as needed.   . [DISCONTINUED] Cranberry 125 MG TABS Take 1 capsule by mouth daily.  . [DISCONTINUED] doxycycline (VIBRA-TABS) 100 MG tablet Take 1 tablet (100 mg total) by mouth 2 (two) times daily.   No facility-administered encounter medications on file as of 04/23/2019.    Allergies  Allergen Reactions  . Clarithromycin Other (See Comments)  Questionable Biaxin vs Flagyl with chest discomfort & irregular heartbeat (most likely thought to be related to the Biaxin)  . Fesoterodine     Other reaction(s): Other (See Comments) weakness  . Ibuprofen Other (See Comments)    To prevent swelling,increase hr/bp Other reaction(s): Other (See Comments) To prevent swelling,increase hr/bp  . Lamictal [Lamotrigine]     Hair thinning  . Lipitor  [Atorvastatin] Other (See Comments)    Mayalgia  . Metronidazole Other (See Comments)    Questionable Biaxin vs Flagyl with chest discomfort & irregular heartbeat (most likely thought to be related to the Biaxin)  . Other Other (See Comments)    decongestants  . Penicillins Nausea Only  . Simvastatin Other (See Comments)    Hari Loss  . Sulfa Antibiotics     Other reaction(s): Unknown Other reaction(s): Unknown Patient cant remember  . Toviaz  [Fesoterodine Fumarate Er] Other (See Comments)    weakness   Review of Systems  Constitutional: Negative for chills, diaphoresis, fever and malaise/fatigue.  HENT: Negative for congestion, ear pain, sinus pain and sore throat.   Respiratory: Negative for cough and shortness of breath.   Cardiovascular: Negative for chest pain.  Gastrointestinal: Negative for abdominal pain and diarrhea.  Musculoskeletal: Negative for myalgias.  Neurological: Negative for headaches.    Objective:  BP 118/70 (BP Location: Right Arm, Patient Position: Sitting, Cuff Size: Normal)   Pulse 71   Temp (!) 96.8 F (36 C) (Skin)   Wt 127 lb (57.6 kg)   SpO2 97%   BMI 24.80 kg/m   Wt Readings from Last 3 Encounters:  04/23/19 127 lb (57.6 kg)  08/18/18 127 lb (57.6 kg)  02/10/18 125 lb (56.7 kg)   Physical Exam  Constitutional: She is oriented to person, place, and time and well-developed, well-nourished, and in no distress.  HENT:  Head: Normocephalic.  Eyes: Conjunctivae are normal.  Neck: Neck supple.  Cardiovascular: Normal rate.  Pulmonary/Chest: Effort normal and breath sounds normal.  Abdominal: Soft. Bowel sounds are normal.  Musculoskeletal: Normal range of motion.  Neurological: She is alert and oriented to person, place, and time.  Skin: No rash noted.  Psychiatric: Mood, affect and judgment normal.   Depression screen [facility] 2/9 07/04/2017 01/17/2017 08/13/2016 08/13/2016  Decreased Interest 2 1 0 0  Down, Depressed, Hopeless 2 1 0 0  PHQ - 2 Score 4 2 0 0  Altered sleeping 0 0 - 1  Tired, decreased energy 2 1 - 3  Change in appetite 2 0 - 0  Feeling bad or failure about yourself  0 0 - 0  Trouble concentrating 0 0 - 0  Moving slowly or fidgety/restless 1 1 - 1  Suicidal thoughts 0 0 - 0  PHQ-9 Score 9 4 - 5  Difficult doing work/chores Not difficult at all - - -     Assessment and Plan :   1. Mixed hyperlipidemia Tolerating the Lovastatin 40 mg qd without side effects. Recheck CMP, Lipid Panel and TSH. Continue walking 25 minutes daily and low fat diet. - Comprehensive Metabolic Panel (CMET) - Lipid Profile - TSH  2. Depression with anxiety Well controlled by Remeron and Buspar with regular follow up with psychiatrist (Dr. Nicolasa Ducking). Recheck routine labs. PHQ-9 score only 1/30 today. - CBC with Differential - Comprehensive Metabolic Panel (CMET) - TSH  3. Constipation, chronic Using Miralax daily with stool softener to maintain normal BM's. Scheduled for colonoscopy in January by Dr. Aundria Mems (GI). Recheck routine labs. Denies  hematochezia or melena. - CBC with Differential - Comprehensive Metabolic Panel (CMET) - TSH  4. Chronic reflux  esophagitis Continues to use PPI per gastroenterologist and planning EGD by Dr. Aundria Mems 07-15-19. No hematemesis or significant dyspepsia with medications. Recheck routine labs. - CBC with Differential - Comprehensive Metabolic Panel (CMET)

## 2019-04-24 ENCOUNTER — Other Ambulatory Visit: Payer: Self-pay

## 2019-04-24 DIAGNOSIS — Z20822 Contact with and (suspected) exposure to covid-19: Secondary | ICD-10-CM

## 2019-04-24 LAB — CBC WITH DIFFERENTIAL/PLATELET
Basophils Absolute: 0.1 10*3/uL (ref 0.0–0.2)
Basos: 1 %
EOS (ABSOLUTE): 0.1 10*3/uL (ref 0.0–0.4)
Eos: 3 %
Hematocrit: 37.9 % (ref 34.0–46.6)
Hemoglobin: 12.9 g/dL (ref 11.1–15.9)
Immature Grans (Abs): 0 10*3/uL (ref 0.0–0.1)
Immature Granulocytes: 0 %
Lymphocytes Absolute: 1.7 10*3/uL (ref 0.7–3.1)
Lymphs: 40 %
MCH: 31.9 pg (ref 26.6–33.0)
MCHC: 34 g/dL (ref 31.5–35.7)
MCV: 94 fL (ref 79–97)
Monocytes Absolute: 0.3 10*3/uL (ref 0.1–0.9)
Monocytes: 8 %
Neutrophils Absolute: 2.1 10*3/uL (ref 1.4–7.0)
Neutrophils: 48 %
Platelets: 153 10*3/uL (ref 150–450)
RBC: 4.04 x10E6/uL (ref 3.77–5.28)
RDW: 11.5 % — ABNORMAL LOW (ref 11.7–15.4)
WBC: 4.3 10*3/uL (ref 3.4–10.8)

## 2019-04-24 LAB — COMPREHENSIVE METABOLIC PANEL
ALT: 13 IU/L (ref 0–32)
AST: 19 IU/L (ref 0–40)
Albumin/Globulin Ratio: 2.5 — ABNORMAL HIGH (ref 1.2–2.2)
Albumin: 4.7 g/dL (ref 3.7–4.7)
Alkaline Phosphatase: 52 IU/L (ref 39–117)
BUN/Creatinine Ratio: 26 (ref 12–28)
BUN: 23 mg/dL (ref 8–27)
Bilirubin Total: 0.3 mg/dL (ref 0.0–1.2)
CO2: 26 mmol/L (ref 20–29)
Calcium: 9.4 mg/dL (ref 8.7–10.3)
Chloride: 104 mmol/L (ref 96–106)
Creatinine, Ser: 0.87 mg/dL (ref 0.57–1.00)
GFR calc Af Amer: 75 mL/min/{1.73_m2} (ref >59)
GFR calc non Af Amer: 65 mL/min/{1.73_m2} (ref >59)
Globulin, Total: 1.9 g/dL (ref 1.5–4.5)
Glucose: 84 mg/dL (ref 65–99)
Potassium: 3.9 mmol/L (ref 3.5–5.2)
Sodium: 142 mmol/L (ref 134–144)
Total Protein: 6.6 g/dL (ref 6.0–8.5)

## 2019-04-24 LAB — LIPID PANEL
Chol/HDL Ratio: 3.1 ratio (ref 0.0–4.4)
Cholesterol, Total: 196 mg/dL (ref 100–199)
HDL: 63 mg/dL (ref >39)
LDL Chol Calc (NIH): 117 mg/dL — ABNORMAL HIGH (ref 0–99)
Triglycerides: 86 mg/dL (ref 0–149)
VLDL Cholesterol Cal: 16 mg/dL (ref 5–40)

## 2019-04-24 LAB — TSH: TSH: 2.41 u[IU]/mL (ref 0.450–4.500)

## 2019-04-26 LAB — NOVEL CORONAVIRUS, NAA: SARS-CoV-2, NAA: NOT DETECTED

## 2019-05-16 DIAGNOSIS — M67442 Ganglion, left hand: Secondary | ICD-10-CM | POA: Diagnosis not present

## 2019-05-17 DIAGNOSIS — L929 Granulomatous disorder of the skin and subcutaneous tissue, unspecified: Secondary | ICD-10-CM | POA: Diagnosis not present

## 2019-05-17 DIAGNOSIS — N950 Postmenopausal bleeding: Secondary | ICD-10-CM | POA: Diagnosis not present

## 2019-05-17 DIAGNOSIS — N8111 Cystocele, midline: Secondary | ICD-10-CM | POA: Diagnosis not present

## 2019-06-05 ENCOUNTER — Telehealth: Payer: Self-pay

## 2019-06-05 NOTE — Telephone Encounter (Signed)
Apt made for 06/06/2019 at 4pm

## 2019-06-05 NOTE — Telephone Encounter (Signed)
Copied from [place] [id number]. Topic: Appointment Scheduling - Scheduling Inquiry for Clinic >> Jun 05, 2019  1:04 PM Ann Young wrote: [name]. Patient states she is having burning w urination and frequent urination

## 2019-06-06 ENCOUNTER — Ambulatory Visit (INDEPENDENT_AMBULATORY_CARE_PROVIDER_SITE_OTHER): Payer: Medicare Other | Admitting: Adult Health

## 2019-06-06 ENCOUNTER — Encounter: Payer: Self-pay | Admitting: Adult Health

## 2019-06-06 ENCOUNTER — Other Ambulatory Visit: Payer: Self-pay

## 2019-06-06 VITALS — BP 112/70 | HR 68 | Temp 96.6°F | Resp 16 | Wt 129.9 lb

## 2019-06-06 DIAGNOSIS — N3091 Cystitis, unspecified with hematuria: Secondary | ICD-10-CM | POA: Diagnosis not present

## 2019-06-06 DIAGNOSIS — N3000 Acute cystitis without hematuria: Secondary | ICD-10-CM

## 2019-06-06 DIAGNOSIS — R102 Pelvic and perineal pain: Secondary | ICD-10-CM | POA: Insufficient documentation

## 2019-06-06 LAB — POCT URINALYSIS DIPSTICK
Bilirubin, UA: NEGATIVE
Glucose, UA: NEGATIVE
Ketones, UA: NEGATIVE
Nitrite, UA: NEGATIVE
Protein, UA: NEGATIVE
Spec Grav, UA: 1.01 (ref 1.010–1.025)
Urobilinogen, UA: 0.2 E.U./dL
pH, UA: 5 (ref 5.0–8.0)

## 2019-06-06 MED ORDER — NITROFURANTOIN MONOHYD MACRO 100 MG PO CAPS: 100.0000 mg | ORAL_CAPSULE | Freq: Two times a day (BID) | ORAL | 0 refills | Status: DC

## 2019-06-06 NOTE — Patient Instructions (Signed)
Urinary Tract Infection, Adult A urinary tract infection (UTI) is an infection of any part of the urinary tract. The urinary tract includes:  The kidneys.  The ureters.  The bladder.  The urethra. These organs make, store, and get rid of pee (urine) in the body. What are the causes? This is caused by germs (bacteria) in your genital area. These germs grow and cause swelling (inflammation) of your urinary tract. What increases the risk? You are more likely to develop this condition if:  You have a small, thin tube (catheter) to drain pee.  You cannot control when you pee or poop (incontinence).  You are female, and: ? You use these methods to prevent pregnancy: ? A medicine that kills sperm (spermicide). ? A device that blocks sperm (diaphragm). ? You have low levels of a female hormone (estrogen). ? You are pregnant.  You have genes that add to your risk.  You are sexually active.  You take antibiotic medicines.  You have trouble peeing because of: ? A prostate that is bigger than normal, if you are female. ? A blockage in the part of your body that drains pee from the bladder (urethra). ? A kidney stone. ? A nerve condition that affects your bladder (neurogenic bladder). ? Not getting enough to drink. ? Not peeing often enough.  You have other conditions, such as: ? Diabetes. ? A weak disease-fighting system (immune system). ? Sickle cell disease. ? Gout. ? Injury of the spine. What are the signs or symptoms? Symptoms of this condition include:  Needing to pee right away (urgently).  Peeing often.  Peeing small amounts often.  Pain or burning when peeing.  Blood in the pee.  Pee that smells bad or not like normal.  Trouble peeing.  Pee that is cloudy.  Fluid coming from the vagina, if you are female.  Pain in the belly or lower back. Other symptoms include:  Throwing up (vomiting).  No urge to eat.  Feeling mixed up (confused).  Being tired  and grouchy (irritable).  A fever.  Watery poop (diarrhea). How is this treated? This condition may be treated with:  Antibiotic medicine.  Other medicines.  Drinking enough water. Follow these instructions at home:  Medicines  Take over-the-counter and prescription medicines only as told by your doctor.  If you were prescribed an antibiotic medicine, take it as told by your doctor. Do not stop taking it even if you start to feel better. General instructions  Make sure you: ? Pee until your bladder is empty. ? Do not hold pee for a long time. ? Empty your bladder after sex. ? Wipe from front to back after pooping if you are a female. Use each tissue one time when you wipe.  Drink enough fluid to keep your pee pale yellow.  Keep all follow-up visits as told by your doctor. This is important. Contact a doctor if:  You do not get better after 1-2 days.  Your symptoms go away and then come back. Get help right away if:  You have very bad back pain.  You have very bad pain in your lower belly.  You have a fever.  You are sick to your stomach (nauseous).  You are throwing up. Summary  A urinary tract infection (UTI) is an infection of any part of the urinary tract.  This condition is caused by germs in your genital area.  There are many risk factors for a UTI. These include having a small, thin   tube to drain pee and not being able to control when you pee or poop.  Treatment includes antibiotic medicines for germs.  Drink enough fluid to keep your pee pale yellow. This information is not intended to replace advice given to you by your health care provider. Make sure you discuss any questions you have with your health care provider. Document Released: 11/17/2007 Document Revised: 05/18/2018 Document Reviewed: 12/08/2017 Elsevier Patient Education  [place]. Nitrofurantoin tablets or capsules What is this medicine? NITROFURANTOIN (nye troe fyoor AN  toyn) is an antibiotic. It is used to treat urinary tract infections. This medicine may be used for other purposes; ask your health care provider or pharmacist if you have questions. COMMON BRAND NAME(S): Macrobid, Macrodantin, Urotoin What should I tell my health care provider before I take this medicine? They need to know if you have any of these conditions:  anemia  diabetes  glucose-6-phosphate dehydrogenase deficiency  kidney disease  liver disease  lung disease  other chronic illness  an unusual or allergic reaction to nitrofurantoin, other antibiotics, other medicines, foods, dyes or preservatives  pregnant or trying to get pregnant  breast-feeding How should I use this medicine? Take this medicine by mouth with a glass of water. Follow the directions on the prescription label. Take this medicine with food or milk. Take your doses at regular intervals. Do not take your medicine more often than directed. Do not stop taking except on your doctor's advice. Talk to your pediatrician regarding the use of this medicine in children. While this drug may be prescribed for selected conditions, precautions do apply. Overdosage: If you think you have taken too much of this medicine contact a poison control center or emergency room at once. NOTE: This medicine is only for you. Do not share this medicine with others. What if I miss a dose? If you miss a dose, take it as soon as you can. If it is almost time for your next dose, take only that dose. Do not take double or extra doses. What may interact with this medicine?  antacids containing magnesium trisilicate  probenecid  quinolone antibiotics like ciprofloxacin, lomefloxacin, norfloxacin and ofloxacin  sulfinpyrazone This list may not describe all possible interactions. Give your health care provider a list of all the medicines, herbs, non-prescription drugs, or dietary supplements you use. Also tell them if you smoke, drink  alcohol, or use illegal drugs. Some items may interact with your medicine. What should I watch for while using this medicine? Tell your doctor or health care professional if your symptoms do not improve or if you get new symptoms. Drink several glasses of water a day. If you are taking this medicine for a long time, visit your doctor for regular checks on your progress. If you are diabetic, you may get a false positive result for sugar in your urine with certain brands of urine tests. Check with your doctor. What side effects may I notice from receiving this medicine? Side effects that you should report to your doctor or health care professional as soon as possible:  allergic reactions like skin rash or hives, swelling of the face, lips, or tongue  chest pain  cough  difficulty breathing  dizziness, drowsiness  fever or infection  joint aches or pains  pale or blue-tinted skin  redness, blistering, peeling or loosening of the skin, including inside the mouth  tingling, burning, pain, or numbness in hands or feet  unusual bleeding or bruising  unusually weak or  tired  yellowing of eyes or skin Side effects that usually do not require medical attention (report to your doctor or health care professional if they continue or are bothersome):  dark urine  diarrhea  headache  loss of appetite  nausea or vomiting  temporary hair loss This list may not describe all possible side effects. Call your doctor for medical advice about side effects. You may report side effects to FDA at 1-800-FDA-1088. Where should I keep my medicine? Keep out of the reach of children. Store at room temperature between 15 and 30 degrees C (59 and 86 degrees F). Protect from light. Throw away any unused medicine after the expiration date. NOTE: This sheet is a summary. It may not cover all possible information. If you have questions about this medicine, talk to your doctor, pharmacist, or health care  provider.  2020 Elsevier/Gold Standard (2007-12-20 15:56:47)

## 2019-06-06 NOTE — Progress Notes (Signed)
Patient: Ann Young Female    DOB: Jun 03, 1941   78 y.o.   MRN: [id number] Visit Date: 06/06/2019  Today's Provider: Marcille Buffy, FNP   Chief Complaint  Patient presents with  . Urinary Frequency   Subjective:     Urinary Frequency  This is a new problem. The current episode started 1 to 4 weeks ago. The problem occurs every urination. The problem has been gradually worsening. The pain is mild. There has been no fever. Associated symptoms include flank pain, frequency and urgency. Pertinent negatives include no chills, discharge, hematuria, hesitancy, nausea, possible pregnancy, sweats or vomiting. She has tried nothing for the symptoms. Her past medical history is significant for recurrent UTIs.   She reports she has a history of cystitis. Treated wioth macrtobid last time- tolerated well.   Denies any hematuria.  Allergies  Allergen Reactions  . Atorvastatin Other (See Comments)    Mayalgia Other reaction(s): Other (See Comments), Other (See Comments) Mayalgia Mayalgia  . Clarithromycin Other (See Comments)    Questionable Biaxin vs Flagyl with chest discomfort & irregular heartbeat (most likely thought to be related to the Biaxin) Other reaction(s): Other (See Comments) Questionable Biaxin vs Flagyl with chest discomfort & irregular heartbeat (most likely thought to be related to the Biaxin) Questionable Biaxin vs Flagyl with chest discomfort & irregular heartbeat (most likely thought to be related to the Biaxin)  . Fesoterodine     Other reaction(s): Other (See Comments) weakness Other reaction(s): Other (See Comments) Other reaction(s): Other (See Comments) weakness weakness  . Ibuprofen Other (See Comments)    To prevent swelling,increase hr/bp Other reaction(s): Other (See Comments) To prevent swelling,increase hr/bp  . Lamotrigine Other (See Comments)    Hair thinning Hair thinning Other reaction(s): Unknown Hair thinning Hair  thinning   . Metronidazole Other (See Comments)    Questionable Biaxin vs Flagyl with chest discomfort & irregular heartbeat (most likely thought to be related to the Biaxin) Other reaction(s): Other (See Comments), Other (See Comments) Questionable Biaxin vs Flagyl with chest discomfort & irregular heartbeat (most likely thought to be related to the Biaxin) Questionable Biaxin vs Flagyl with chest discomfort & irregular heartbeat (most likely thought to be related to the Biaxin)  . Other Other (See Comments)    decongestants  . Penicillins Nausea Only  . Simvastatin Other (See Comments)    Hari Loss Other reaction(s): Other (See Comments), Other (See Comments) Hari Loss Hari Loss  . Sulfa Antibiotics     Other reaction(s): Unknown Other reaction(s): Unknown Patient cant remember  . Toviaz  [Fesoterodine Fumarate Er] Other (See Comments)    weakness     Current Outpatient Medications:  .  alendronate (FOSAMAX) 70 MG tablet, Take 1 tablet (70 mg total) by mouth once a week. Reported on 08/18/2015, Disp: 12 tablet, Rfl: 3 .  Ascorbic Acid (VITAMIN C) 1000 MG tablet, Take 1,000 mg by mouth daily., Disp: , Rfl:  .  aspirin 81 MG tablet, Take 81 mg by mouth daily., Disp: , Rfl:  .  Calcium Carb-Cholecalciferol 600-200 MG-UNIT TABS, Take 1 tablet by mouth 2 (two) times daily., Disp: , Rfl:  .  cetirizine (ZYRTEC) 10 MG tablet, TAKE ONE TABLET BY MOUTH ONCE DAILY FOR ALLERGIES, Disp: 90 tablet, Rfl: 3 .  Cholecalciferol (VITAMIN D PO), Take by mouth., Disp: , Rfl:  .  docusate sodium (COLACE) 100 MG capsule, Take by mouth., Disp: , Rfl:  .  fluticasone (FLONASE) 50  MCG/ACT nasal spray, Place 1 spray into both nostrils daily. , Disp: , Rfl:  .  hydrocortisone 2.5 % ointment, , Disp: , Rfl:  .  ketoconazole (NIZORAL) 2 % cream, , Disp: , Rfl:  .  mirtazapine (REMERON) 30 MG tablet, , Disp: , Rfl:  .  Omega-3 Fatty Acids (FISH OIL PO), Take 1 capsule by mouth daily. , Disp: , Rfl:  .   omeprazole (PRILOSEC) 40 MG capsule, Take by mouth., Disp: , Rfl:  .  ondansetron (ZOFRAN-ODT) 4 MG disintegrating tablet, , Disp: , Rfl:  .  pantoprazole (PROTONIX) 40 MG tablet, Take 1 tablet (40 mg total) by mouth daily., Disp: 90 tablet, Rfl: 0 .  solifenacin (VESICARE) 5 MG tablet, Take by mouth., Disp: , Rfl:  .  triamcinolone lotion (KENALOG) 0.1 %, Apply 1 application topically daily as needed. , Disp: , Rfl:   Review of Systems  Constitutional: Negative for activity change, appetite change, chills, diaphoresis, fatigue, fever and unexpected weight change.  Respiratory: Negative.   Cardiovascular: Negative.   Gastrointestinal: Negative for abdominal distention, abdominal pain, anal bleeding, blood in stool, constipation, diarrhea, nausea, rectal pain and vomiting.  Genitourinary: Positive for flank pain, frequency and urgency. Negative for decreased urine volume, difficulty urinating, dyspareunia, dysuria, enuresis, genital sores, hematuria, hesitancy, menstrual problem, pelvic pain, vaginal bleeding, vaginal discharge and vaginal pain.  Neurological: Negative for dizziness, light-headedness, numbness and headaches.  Psychiatric/Behavioral: Negative for confusion and decreased concentration.    Social History   Tobacco Use  . Smoking status: Never Smoker  . Smokeless tobacco: Never Used  Substance Use Topics  . Alcohol use: No    Alcohol/week: 0.0 standard drinks      Objective:   BP 112/70   Pulse 68   Temp (!) 96.6 F (35.9 C) (Oral)   Resp 16   Wt 129 lb 13.9 oz (58.9 kg)   BMI 25.36 kg/m  Vitals:   06/06/19 1544  BP: 112/70  Pulse: 68  Resp: 16  Temp: (!) 96.6 F (35.9 C)  TempSrc: Oral  Weight: 129 lb 13.9 oz (58.9 kg)  Body mass index is 25.36 kg/m.   Physical Exam Vitals reviewed.  Constitutional:      General: She is not in acute distress.    Appearance: Normal appearance. She is not ill-appearing, toxic-appearing or diaphoretic.  HENT:     Head:  Normocephalic and atraumatic.  Eyes:     Pupils: Pupils are equal, round, and reactive to light.  Cardiovascular:     Rate and Rhythm: Normal rate and regular rhythm.  Abdominal:     General: There is no distension.     Palpations: Abdomen is soft. There is no mass.     Tenderness: There is no abdominal tenderness. There is no right CVA tenderness, left CVA tenderness, guarding or rebound.     Hernia: No hernia is present.  Musculoskeletal:        General: Normal range of motion.     Cervical back: Normal range of motion and neck supple.  Skin:    General: Skin is warm and dry.     Capillary Refill: Capillary refill takes less than 2 seconds.  Neurological:     Mental Status: She is alert and oriented to person, place, and time.  Psychiatric:        Mood and Affect: Mood normal.        Behavior: Behavior normal.        Thought Content: Thought content normal.  Judgment: Judgment normal.      Results for orders placed or performed in visit on 06/06/19  POCT Urinalysis Dipstick  Result Value Ref Range   Color, UA dark yellow    Clarity, UA clear    Glucose, UA Negative Negative   Bilirubin, UA negative    Ketones, UA negative    Spec Grav, UA 1.010 1.010 - 1.025   Blood, UA moderate    pH, UA 5.0 5.0 - 8.0   Protein, UA Negative Negative   Urobilinogen, UA 0.2 0.2 or 1.0 E.U./dL   Nitrite, UA negative    Leukocytes, UA Moderate (2+) (A) Negative   Appearance     Odor         Assessment & Plan    Cystitis with hematuria - Plan: POCT Urinalysis Dipstick, Urine Culture  Acute cystitis without hematuria - Plan: nitrofurantoin, macrocrystal-monohydrate, (MACROBID) 100 MG capsule  Meds ordered this encounter  Medications  . nitrofurantoin, macrocrystal-monohydrate, (MACROBID) 100 MG capsule    Sig: Take 1 capsule (100 mg total) by mouth 2 (two) times daily.    Dispense:  20 capsule    Refill:  0       Advised patient call the office or your primary care  doctor for an appointment if no improvement within 72 hours or if any symptoms change or worsen at any time  Advised ER or urgent Care if after hours or on weekend. Call 911 for emergency symptoms at any time.Patinet verbalized understanding of all instructions given/reviewed and treatment plan and has no further questions or concerns at this time.    The entirety of the information documented in the History of Present Illness, Review of Systems and Physical Exam were personally obtained by me. Portions of this information were initially documented by the  Certified Medical Assistant whose name is documented in [place] and reviewed by me for thoroughness and accuracy.  I have personally performed the exam and reviewed the chart and it is accurate to the best of my knowledge.  Haematologist has been used and any errors in dictation or transcription are unintentional.  Kelby Aline. [place], [facility]

## 2019-06-06 NOTE — Progress Notes (Signed)
Treated and sent for culture.

## 2019-06-09 LAB — URINE CULTURE

## 2019-06-11 ENCOUNTER — Telehealth: Payer: Self-pay

## 2019-06-11 NOTE — Telephone Encounter (Signed)
-----   Message from Doreen Beam, [place] sent at 06/10/2019  9:07 PM EST ----- E. coli in urine. Macrobid is appropriate and she could continue and return if symptoms not resolving or persist.

## 2019-06-11 NOTE — Telephone Encounter (Signed)
[id number]

## 2019-06-19 ENCOUNTER — Telehealth (INDEPENDENT_AMBULATORY_CARE_PROVIDER_SITE_OTHER): Payer: Medicare Other

## 2019-06-19 DIAGNOSIS — R300 Dysuria: Secondary | ICD-10-CM

## 2019-06-19 NOTE — Telephone Encounter (Signed)
Yes she would need to provide another specimen, if symptoms are worsening she should be seen.

## 2019-06-19 NOTE — Telephone Encounter (Signed)
Copied from [place] [phone]. Topic: General - Other >> Jun 19, 2019 10:47 AM Greggory Keen D wrote: Reason for CRM: Pt called saying she finished her antibiotic for the UTI on Thursday and is still having symptoms.  She wants to know if she needs another round of the antibiotic.  She uses [facility] [address]  CB# [phone]

## 2019-06-19 NOTE — Telephone Encounter (Signed)
[id number] 

## 2019-06-20 LAB — POCT URINALYSIS DIPSTICK
Bilirubin, UA: NEGATIVE
Blood, UA: NEGATIVE
Glucose, UA: NEGATIVE
Ketones, UA: NEGATIVE
Leukocytes, UA: NEGATIVE
Nitrite, UA: NEGATIVE
Protein, UA: NEGATIVE
Spec Grav, UA: <=1.005 — AB (ref 1.010–1.025)
Urobilinogen, UA: 0.2 E.U./dL
pH, UA: 5 (ref 5.0–8.0)

## 2019-06-20 NOTE — Telephone Encounter (Signed)
Spoke with patient on the phone she complains still of dysuria and frequency but reports that it has improved. Patient will drop in office after lunch to drop off specimen for me to dip. KW

## 2019-06-20 NOTE — Telephone Encounter (Signed)
Pt returned.

## 2019-06-20 NOTE — Telephone Encounter (Signed)
No bacteria or hematuria.  Antibiotics completed. Continue to hydrate. If any symptoms persist/ return to the office for follow up visit 06/22/2019

## 2019-06-20 NOTE — Telephone Encounter (Signed)
Pt called back in returning call. Pt says that her symptoms are getting better they are just not completely gone. Pt would like a call back to discuss further for refill on med

## 2019-06-20 NOTE — Addendum Note (Signed)
Addended by: Minette Headland on: 06/20/2019 03:18 PM   Modules accepted: Orders

## 2019-06-20 NOTE — Telephone Encounter (Signed)
Patient came in to drop of specimen for testing. Urinalysis in office was normal with no signs of bacteria, patient was advised per Laverna Peace to continue drinking fluids if symptoms persisted patient was advised to return back to clinic on 06/22/19 for evaluation. Patient voiced understanding. KW

## 2019-06-20 NOTE — Telephone Encounter (Signed)
Ok

## 2019-06-25 ENCOUNTER — Other Ambulatory Visit: Payer: Self-pay | Admitting: Family Medicine

## 2019-06-25 DIAGNOSIS — K317 Polyp of stomach and duodenum: Secondary | ICD-10-CM

## 2019-06-25 DIAGNOSIS — K2100 Gastro-esophageal reflux disease with esophagitis, without bleeding: Secondary | ICD-10-CM

## 2019-06-27 ENCOUNTER — Ambulatory Visit: Admit: 2019-06-27 | Payer: Medicare Other | Admitting: Internal Medicine

## 2019-06-27 SURGERY — ESOPHAGOGASTRODUODENOSCOPY (EGD) WITH PROPOFOL
Anesthesia: General

## 2019-07-03 DIAGNOSIS — Z9889 Other specified postprocedural states: Secondary | ICD-10-CM | POA: Insufficient documentation

## 2019-08-27 ENCOUNTER — Other Ambulatory Visit: Payer: Self-pay | Admitting: Family Medicine

## 2019-08-27 DIAGNOSIS — Z1231 Encounter for screening mammogram for malignant neoplasm of breast: Secondary | ICD-10-CM

## 2019-09-24 ENCOUNTER — Other Ambulatory Visit: Payer: Self-pay | Admitting: Family Medicine

## 2019-09-24 DIAGNOSIS — K2100 Gastro-esophageal reflux disease with esophagitis, without bleeding: Secondary | ICD-10-CM

## 2019-09-24 DIAGNOSIS — K317 Polyp of stomach and duodenum: Secondary | ICD-10-CM

## 2019-09-24 NOTE — Telephone Encounter (Signed)
Requested Prescriptions  Pending Prescriptions Disp Refills  . pantoprazole (PROTONIX) 40 MG tablet [Pharmacy Med Name: Pantoprazole Sodium 40 MG Oral Tablet Delayed Release] 90 tablet 2    Sig: Take 1 tablet by mouth once daily     Gastroenterology: Proton Pump Inhibitors Passed - 09/24/2019 10:59 AM      Passed - Valid encounter within last 12 months    Recent Outpatient Visits          3 months ago Cystitis with hematuria   [place], FNP   5 months ago Mixed hyperlipidemia   [facility], Vickki Muff, [place]   1 year ago Mixed hyperlipidemia   [facility], Vickki Muff, [place]   1 year ago Subclinical hypothyroidism   [facility], [place]ki Muff, [place]   1 year ago Hair thinning   [facility], [place]ickki Muff, [place]

## 2019-10-01 ENCOUNTER — Other Ambulatory Visit: Payer: Self-pay

## 2019-10-01 ENCOUNTER — Other Ambulatory Visit
Admission: RE | Admit: 2019-10-01 | Discharge: 2019-10-01 | Disposition: A | Discharge status: Home / Self Care | Payer: Medicare Other | Source: Ambulatory Visit | Attending: Internal Medicine | Admitting: Internal Medicine

## 2019-10-01 DIAGNOSIS — Z01812 Encounter for preprocedural laboratory examination: Secondary | ICD-10-CM | POA: Insufficient documentation

## 2019-10-01 DIAGNOSIS — Z20822 Contact with and (suspected) exposure to covid-19: Secondary | ICD-10-CM | POA: Insufficient documentation

## 2019-10-02 ENCOUNTER — Encounter: Payer: Self-pay | Admitting: Internal Medicine

## 2019-10-02 LAB — SARS CORONAVIRUS 2 (TAT 6-24 HRS): SARS Coronavirus 2: NEGATIVE

## 2019-10-03 ENCOUNTER — Ambulatory Visit: Payer: Medicare Other | Admitting: Anesthesiology

## 2019-10-03 ENCOUNTER — Encounter
Admission: EM | Disposition: A | Discharge status: Home / Self Care | Payer: Self-pay | Source: Home / Self Care | Attending: Student

## 2019-10-03 ENCOUNTER — Ambulatory Visit
Admission: RE | Admit: 2019-10-03 | Discharge: 2019-10-03 | Disposition: A | Discharge status: Home / Self Care | Payer: Medicare Other | Source: Ambulatory Visit | Attending: Internal Medicine | Admitting: Internal Medicine

## 2019-10-03 ENCOUNTER — Encounter
Admission: RE | Disposition: A | Discharge status: Home / Self Care | Payer: Self-pay | Source: Ambulatory Visit | Attending: Internal Medicine

## 2019-10-03 ENCOUNTER — Emergency Department
Admission: EM | Admit: 2019-10-03 | Discharge: 2019-10-03 | Disposition: A | Discharge status: Home / Self Care | Payer: Medicare Other | Source: Home / Self Care | Attending: Student | Admitting: Student

## 2019-10-03 ENCOUNTER — Ambulatory Visit: Admission: RE | Admit: 2019-10-03 | Payer: Medicare Other | Source: Home / Self Care | Admitting: Internal Medicine

## 2019-10-03 ENCOUNTER — Encounter: Payer: Self-pay | Admitting: Emergency Medicine

## 2019-10-03 ENCOUNTER — Other Ambulatory Visit: Payer: Self-pay

## 2019-10-03 ENCOUNTER — Encounter: Payer: Self-pay | Admitting: Internal Medicine

## 2019-10-03 DIAGNOSIS — Z85038 Personal history of other malignant neoplasm of large intestine: Secondary | ICD-10-CM | POA: Diagnosis present

## 2019-10-03 DIAGNOSIS — Z980 Intestinal bypass and anastomosis status: Secondary | ICD-10-CM | POA: Diagnosis not present

## 2019-10-03 DIAGNOSIS — K640 First degree hemorrhoids: Secondary | ICD-10-CM | POA: Insufficient documentation

## 2019-10-03 DIAGNOSIS — R112 Nausea with vomiting, unspecified: Secondary | ICD-10-CM

## 2019-10-03 DIAGNOSIS — J45909 Unspecified asthma, uncomplicated: Secondary | ICD-10-CM | POA: Diagnosis not present

## 2019-10-03 DIAGNOSIS — K219 Gastro-esophageal reflux disease without esophagitis: Secondary | ICD-10-CM | POA: Insufficient documentation

## 2019-10-03 DIAGNOSIS — Z1211 Encounter for screening for malignant neoplasm of colon: Secondary | ICD-10-CM | POA: Insufficient documentation

## 2019-10-03 DIAGNOSIS — E876 Hypokalemia: Secondary | ICD-10-CM

## 2019-10-03 DIAGNOSIS — Z8711 Personal history of peptic ulcer disease: Secondary | ICD-10-CM | POA: Diagnosis not present

## 2019-10-03 DIAGNOSIS — Z79899 Other long term (current) drug therapy: Secondary | ICD-10-CM | POA: Insufficient documentation

## 2019-10-03 DIAGNOSIS — F419 Anxiety disorder, unspecified: Secondary | ICD-10-CM | POA: Insufficient documentation

## 2019-10-03 DIAGNOSIS — Z886 Allergy status to analgesic agent status: Secondary | ICD-10-CM | POA: Diagnosis not present

## 2019-10-03 DIAGNOSIS — F329 Major depressive disorder, single episode, unspecified: Secondary | ICD-10-CM | POA: Diagnosis not present

## 2019-10-03 DIAGNOSIS — Z882 Allergy status to sulfonamides status: Secondary | ICD-10-CM | POA: Insufficient documentation

## 2019-10-03 DIAGNOSIS — Z7982 Long term (current) use of aspirin: Secondary | ICD-10-CM | POA: Diagnosis not present

## 2019-10-03 DIAGNOSIS — K5730 Diverticulosis of large intestine without perforation or abscess without bleeding: Secondary | ICD-10-CM | POA: Diagnosis not present

## 2019-10-03 DIAGNOSIS — M1990 Unspecified osteoarthritis, unspecified site: Secondary | ICD-10-CM | POA: Insufficient documentation

## 2019-10-03 DIAGNOSIS — E7800 Pure hypercholesterolemia, unspecified: Secondary | ICD-10-CM | POA: Insufficient documentation

## 2019-10-03 DIAGNOSIS — R1310 Dysphagia, unspecified: Secondary | ICD-10-CM | POA: Insufficient documentation

## 2019-10-03 DIAGNOSIS — K317 Polyp of stomach and duodenum: Secondary | ICD-10-CM | POA: Insufficient documentation

## 2019-10-03 DIAGNOSIS — Z880 Allergy status to penicillin: Secondary | ICD-10-CM | POA: Diagnosis not present

## 2019-10-03 DIAGNOSIS — E871 Hypo-osmolality and hyponatremia: Secondary | ICD-10-CM

## 2019-10-03 DIAGNOSIS — Z881 Allergy status to other antibiotic agents status: Secondary | ICD-10-CM | POA: Diagnosis not present

## 2019-10-03 HISTORY — DX: Gastritis, unspecified, without bleeding: K29.70

## 2019-10-03 HISTORY — DX: Unspecified hemorrhoids: K64.9

## 2019-10-03 HISTORY — DX: Cortical age-related cataract, bilateral: H25.013

## 2019-10-03 HISTORY — DX: Esophagitis, unspecified without bleeding: K20.90

## 2019-10-03 HISTORY — PX: COLONOSCOPY: SHX5424

## 2019-10-03 HISTORY — DX: Other specified bacterial intestinal infections: A04.8

## 2019-10-03 HISTORY — DX: Diaphragmatic hernia without obstruction or gangrene: K44.9

## 2019-10-03 HISTORY — PX: ESOPHAGOGASTRODUODENOSCOPY (EGD) WITH PROPOFOL: SHX5813

## 2019-10-03 HISTORY — DX: Personal history of adenomatous and serrated colon polyps: Z86.0101

## 2019-10-03 HISTORY — DX: Zoster without complications: B02.9

## 2019-10-03 HISTORY — DX: Personal history of colonic polyps: Z86.010

## 2019-10-03 HISTORY — DX: Age-related osteoporosis without current pathological fracture: M81.0

## 2019-10-03 LAB — CBC WITH DIFFERENTIAL/PLATELET
Abs Immature Granulocytes: 0.03 10*3/uL (ref 0.00–0.07)
Basophils Absolute: 0.1 10*3/uL (ref 0.0–0.1)
Basophils Relative: 1 %
Eosinophils Absolute: 0.1 10*3/uL (ref 0.0–0.5)
Eosinophils Relative: 2 %
HCT: 36.6 % (ref 36.0–46.0)
Hemoglobin: 12.6 g/dL (ref 12.0–15.0)
Immature Granulocytes: 0 %
Lymphocytes Relative: 29 %
Lymphs Abs: 2.2 10*3/uL (ref 0.7–4.0)
MCH: 31.7 pg (ref 26.0–34.0)
MCHC: 34.4 g/dL (ref 30.0–36.0)
MCV: 92.2 fL (ref 80.0–100.0)
Monocytes Absolute: 0.6 10*3/uL (ref 0.1–1.0)
Monocytes Relative: 8 %
Neutro Abs: 4.7 10*3/uL (ref 1.7–7.7)
Neutrophils Relative %: 60 %
Platelets: 140 10*3/uL — ABNORMAL LOW (ref 150–400)
RBC: 3.97 MIL/uL (ref 3.87–5.11)
RDW: 11.8 % (ref 11.5–15.5)
WBC: 7.7 10*3/uL (ref 4.0–10.5)
nRBC: 0 % (ref 0.0–0.2)

## 2019-10-03 LAB — COMPREHENSIVE METABOLIC PANEL
ALT: 18 U/L (ref 0–44)
AST: 25 U/L (ref 15–41)
Albumin: 4.6 g/dL (ref 3.5–5.0)
Alkaline Phosphatase: 44 U/L (ref 38–126)
Anion gap: 9 (ref 5–15)
BUN: 14 mg/dL (ref 8–23)
CO2: 26 mmol/L (ref 22–32)
Calcium: 8.7 mg/dL — ABNORMAL LOW (ref 8.9–10.3)
Chloride: 95 mmol/L — ABNORMAL LOW (ref 98–111)
Creatinine, Ser: 0.71 mg/dL (ref 0.44–1.00)
GFR calc Af Amer: >60 mL/min (ref >60)
GFR calc non Af Amer: >60 mL/min (ref >60)
Glucose, Bld: 121 mg/dL — ABNORMAL HIGH (ref 70–99)
Potassium: 3.1 mmol/L — ABNORMAL LOW (ref 3.5–5.1)
Sodium: 130 mmol/L — ABNORMAL LOW (ref 135–145)
Total Bilirubin: 1 mg/dL (ref 0.3–1.2)
Total Protein: 7.6 g/dL (ref 6.5–8.1)

## 2019-10-03 LAB — MAGNESIUM: Magnesium: 2.1 mg/dL (ref 1.7–2.4)

## 2019-10-03 LAB — LIPASE, BLOOD: Lipase: 27 U/L (ref 11–51)

## 2019-10-03 SURGERY — COLONOSCOPY WITH PROPOFOL
Anesthesia: General

## 2019-10-03 SURGERY — ESOPHAGOGASTRODUODENOSCOPY (EGD) WITH PROPOFOL
Anesthesia: General

## 2019-10-03 MED ORDER — ONDANSETRON 4 MG PO TBDP: ORAL_TABLET | ORAL | 0 refills | Status: DC

## 2019-10-03 MED ORDER — SODIUM CHLORIDE 0.9 % IV BOLUS
500.0000 mL | Freq: Once | INTRAVENOUS | Status: AC
  Administered 2019-10-03: 06:00:00 500 mL via INTRAVENOUS

## 2019-10-03 MED ORDER — PROPOFOL 500 MG/50ML IV EMUL
INTRAVENOUS | Status: DC | PRN
  Administered 2019-10-03: 125 ug/kg/min via INTRAVENOUS

## 2019-10-03 MED ORDER — ONDANSETRON HCL 4 MG/2ML IJ SOLN
4.0000 mg | Freq: Once | INTRAMUSCULAR | Status: DC | PRN
  Filled 2019-10-03: qty 2

## 2019-10-03 MED ORDER — POTASSIUM CHLORIDE CRYS ER 20 MEQ PO TBCR: 20.0000 meq | EXTENDED_RELEASE_TABLET | Freq: Every day | ORAL | 0 refills | Status: DC

## 2019-10-03 MED ORDER — POTASSIUM CHLORIDE 10 MEQ/100ML IV SOLN
10.0000 meq | INTRAVENOUS | Status: AC
  Administered 2019-10-03 (×2): 10 meq via INTRAVENOUS
  Filled 2019-10-03 (×2): qty 100

## 2019-10-03 MED ORDER — SODIUM CHLORIDE 0.9 % IV SOLN: INTRAVENOUS | Status: DC

## 2019-10-03 MED ORDER — POTASSIUM CHLORIDE IN NACL 20-0.9 MEQ/L-% IV SOLN
Freq: Once | INTRAVENOUS | Status: AC
  Filled 2019-10-03: qty 1000

## 2019-10-03 MED ORDER — ONDANSETRON HCL 4 MG/2ML IJ SOLN
4.0000 mg | INTRAMUSCULAR | Status: AC
  Administered 2019-10-03: 06:00:00 4 mg via INTRAVENOUS
  Filled 2019-10-03: qty 2

## 2019-10-03 NOTE — ED Provider Notes (Signed)
Patient has remained stable while in the emergency department.  Endoscopy has called and is ready for the patient.  As such, feel she is stable for discharge as planned.   Lilia Pro., MD 10/03/19 1135

## 2019-10-03 NOTE — Op Note (Signed)
[facility] Gastroenterology Patient Name: Ann Young Procedure Date: 10/03/2019 1:15 PM MRN: [id number] Account #: [id number] Date of Birth: 1940/08/01 Admit Type: Outpatient Age: 79 Room: [facility] ENDO ROOM 3 Gender: Female Note Status: Finalized Procedure:             Colonoscopy Indications:           High risk colon cancer surveillance: Personal history                         of colonic polyps, High risk colon cancer                         surveillance: Personal history of sessile serrated                         colon polyp (less than 10 mm in size) with no                         dysplasia, High risk colon cancer surveillance:                         Personal history of colon cancer Providers:             Benay Pike. Alice Reichert MD, MD Referring MD:          Vickki Muff. Chrismon, MD (Referring MD) Medicines:             Propofol per Anesthesia Complications:         No immediate complications. Procedure:             Pre-Anesthesia Assessment:                        - The risks and benefits of the procedure and the                         sedation options and risks were discussed with the                         patient. All questions were answered and informed                         consent was obtained.                        - Patient identification and proposed procedure were                         verified prior to the procedure by the nurse. The                         procedure was verified in the procedure room.                        - ASA Grade Assessment: III - A patient with severe                         systemic disease.                        -  After reviewing the risks and benefits, the patient                         was deemed in satisfactory condition to undergo the                         procedure.                        After obtaining informed consent, the colonoscope was                         passed under direct vision. Throughout the  procedure,                         the patient's blood pressure, pulse, and oxygen                         saturations were monitored continuously. The                         Colonoscope was introduced through the anus and                         advanced to the the cecum, identified by appendiceal                         orifice and ileocecal valve. The colonoscopy was                         performed without difficulty. The patient tolerated                         the procedure well. The quality of the bowel                         preparation was good. The ileocecal valve, appendiceal                         orifice, and rectum were photographed. Findings:      The perianal and digital rectal examinations were normal. Pertinent       negatives include normal sphincter tone and no palpable rectal lesions.      There was evidence of a prior end-to-end colo-colonic anastomosis in the       distal rectum. This was patent and was characterized by healthy       appearing mucosa. The anastomosis was traversed.      A few small-mouthed diverticula were found in the left colon.      Non-bleeding internal hemorrhoids were found during retroflexion. The       hemorrhoids were Grade I (internal hemorrhoids that do not prolapse).      The exam was otherwise without abnormality. Impression:            - Patent end-to-end colo-colonic anastomosis,                         characterized by healthy appearing mucosa.                        -  Diverticulosis in the left colon.                        - Non-bleeding internal hemorrhoids.                        - The examination was otherwise normal.                        - No specimens collected. Recommendation:        - Await pathology results from EGD, also performed                         today.                        - Monitor results to esophageal dilation                        - Patient has a contact number available for                          emergencies. The signs and symptoms of potential                         delayed complications were discussed with the patient.                         Return to normal activities tomorrow. Written                         discharge instructions were provided to the patient.                        - Resume previous diet.                        - Continue present medications.                        - No repeat colonoscopy due to current age (23 years                         or older) and the absence of advanced adenomas.                        - Return to nurse practitioner in 6 weeks.                        - Follow up with Stephens November, GI Nurse                         Practioner, in office to discuss results and monitor                         progress.                        - The findings and recommendations were discussed with  the patient. Procedure Code(s):     --- Professional ---                        [id number], Colorectal cancer screening; colonoscopy on                         individual at high risk Diagnosis Code(s):     --- Professional ---                        K57.30, Diverticulosis of large intestine without                         perforation or abscess without bleeding                        K64.0, First degree hemorrhoids                        Z98.0, Intestinal bypass and anastomosis status                        Z85.038, Personal history of other malignant neoplasm                         of large intestine                        Z86.010, Personal history of colonic polyps CPT copyright 2019 American Medical Association. All rights reserved. The codes documented in this report are preliminary and upon coder review may  be revised to meet current compliance requirements. Efrain Sella MD, MD 10/03/2019 2:09:35 PM This report has been signed electronically. Number of Addenda: 0 Note Initiated On: 10/03/2019 1:15 PM Scope Withdrawal Time:  0 hours 6 minutes 19 seconds  Total Procedure Duration: 0 hours 11 minutes 47 seconds  Estimated Blood Loss:  Estimated blood loss: none.      [facility]

## 2019-10-03 NOTE — Transfer of Care (Signed)
Immediate Anesthesia Transfer of Care Note  Patient: Ann Young  Procedure(s) Performed: ESOPHAGOGASTRODUODENOSCOPY (EGD) WITH PROPOFOL (N/A ) COLONOSCOPY (N/A )  Patient Location: PACU  Anesthesia Type:General  Level of Consciousness: awake and sedated  Airway & Oxygen Therapy: Patient Spontanous Breathing and Patient connected to nasal cannula oxygen  Post-op Assessment: Report given to RN and Post -op Vital signs reviewed and stable  Post vital signs: Reviewed and stable  Last Vitals:  Vitals Value Taken Time  BP    Temp    Pulse    Resp    SpO2      Last Pain:  Vitals:   10/03/19 1217  TempSrc: Temporal  PainSc: 0-No pain         Complications: No apparent anesthesia complications

## 2019-10-03 NOTE — Anesthesia Postprocedure Evaluation (Signed)
Anesthesia Post Note  Patient: Ann Young  Procedure(s) Performed: ESOPHAGOGASTRODUODENOSCOPY (EGD) WITH PROPOFOL (N/A ) COLONOSCOPY (N/A )  Patient location during evaluation: Endoscopy Anesthesia Type: General Level of consciousness: awake and alert and oriented Pain management: pain level controlled Vital Signs Assessment: post-procedure vital signs reviewed and stable Respiratory status: spontaneous breathing, nonlabored ventilation and respiratory function stable Cardiovascular status: blood pressure returned to baseline and stable Postop Assessment: no signs of nausea or vomiting Anesthetic complications: no     Last Vitals:  Vitals:   10/03/19 1428 10/03/19 1438  BP: 128/78 132/78  Pulse: 87 88  Resp: 12 15  Temp:    SpO2: 97% 97%    Last Pain:  Vitals:   10/03/19 1428  TempSrc:   PainSc: 0-No pain                 Ellan Tess

## 2019-10-03 NOTE — Op Note (Signed)
[facility] Gastroenterology Patient Name: Ann Young Procedure Date: 10/03/2019 1:37 PM MRN: [id number] Account #: [id number] Date of Birth: 1940-12-05 Admit Type: Outpatient Age: 79 Room: [facility] ENDO ROOM 3 Gender: Female Note Status: Finalized Procedure:             Upper GI endoscopy Indications:           Dysphagia, Gastro-esophageal reflux disease Providers:             Benay Pike. Alice Reichert MD, MD Referring MD:          Vickki Muff. Chrismon, MD (Referring MD) Medicines:             Propofol per Anesthesia Complications:         No immediate complications. Procedure:             Pre-Anesthesia Assessment:                        - The risks and benefits of the procedure and the                         sedation options and risks were discussed with the                         patient. All questions were answered and informed                         consent was obtained.                        - Patient identification and proposed procedure were                         verified prior to the procedure by the nurse. The                         procedure was verified in the procedure room.                        - ASA Grade Assessment: III - A patient with severe                         systemic disease.                        - After reviewing the risks and benefits, the patient                         was deemed in satisfactory condition to undergo the                         procedure.                        After obtaining informed consent, the endoscope was                         passed under direct vision. Throughout the procedure,                         the patient's blood  pressure, pulse, and oxygen                         saturations were monitored continuously. The Endoscope                         was introduced through the mouth, and advanced to the                         third part of duodenum. The upper GI endoscopy was   accomplished without difficulty. The patient tolerated                         the procedure well. Findings:      No endoscopic abnormality was evident in the esophagus to explain the       patient's complaint of dysphagia. It was decided, however, to proceed       with dilation in the distal esophagus. The scope was withdrawn. Dilation       was attempted, but the lesion was not amenable to treatment with a       Maloney dilator because the dilator could not be passed at 19 Fr. The       scope was withdrawn. Dilation was performed with a Maloney dilator with       no resistance at 22 Fr.      Multiple sessile polyps with no stigmata of recent bleeding were found       in the gastric fundus. Biopsies were taken with a cold forceps for       histology.      The examined duodenum was normal.      The exam was otherwise without abnormality. Impression:            - No endoscopic esophageal abnormality to explain                         patient's dysphagia. Esophagus dilated. Unable to                         dilate. Dilated.                        - Multiple gastric polyps. Biopsied.                        - Normal examined duodenum.                        - The examination was otherwise normal. Recommendation:        - Monitor results to esophageal dilation                        - Proceed with colonoscopy Procedure Code(s):     --- Professional ---                        [id number], Esophagogastroduodenoscopy, flexible,                         transoral; with biopsy, single or multiple  43450, Dilation of esophagus, by unguided sound or                         bougie, single or multiple passes Diagnosis Code(s):     --- Professional ---                        K21.9, Gastro-esophageal reflux disease without                         esophagitis                        K31.7, Polyp of stomach and duodenum                        R13.10, Dysphagia, unspecified CPT copyright  2019 American Medical Association. All rights reserved. The codes documented in this report are preliminary and upon coder review may  be revised to meet current compliance requirements. Efrain Sella MD, MD 10/03/2019 1:51:13 PM This report has been signed electronically. Number of Addenda: 0 Note Initiated On: 10/03/2019 1:37 PM Estimated Blood Loss:  Estimated blood loss was minimal.      [facility]

## 2019-10-03 NOTE — Anesthesia Preprocedure Evaluation (Signed)
Anesthesia Evaluation  Patient identified by MRN, date of birth, ID band Patient awake    Reviewed: Allergy & Precautions, NPO status , Patient's Chart, lab work & pertinent test results  History of Anesthesia Complications Negative for: history of anesthetic complications  Airway Mallampati: III       Dental   Pulmonary neg sleep apnea, neg COPD, Not current smoker,           Cardiovascular (-) hypertension(-) Past MI and (-) CHF (-) dysrhythmias (-) Valvular Problems/Murmurs     Neuro/Psych neg Seizures Anxiety Depression    GI/Hepatic Neg liver ROS, hiatal hernia, PUD, GERD  Medicated,  Endo/Other  neg diabetes  Renal/GU negative Renal ROS     Musculoskeletal   Abdominal   Peds  Hematology   Anesthesia Other Findings   Reproductive/Obstetrics                             Anesthesia Physical Anesthesia Plan  ASA: II  Anesthesia Plan: General   Post-op Pain Management:    Induction: Intravenous  PONV Risk Score and Plan: 3 and Propofol infusion, TIVA and Treatment may vary due to age or medical condition  Airway Management Planned: Nasal Cannula  Additional Equipment:   Intra-op Plan:   Post-operative Plan:   Informed Consent: I have reviewed the patients History and Physical, chart, labs and discussed the procedure including the risks, benefits and alternatives for the proposed anesthesia with the patient or authorized representative who has indicated his/her understanding and acceptance.       Plan Discussed with:   Anesthesia Plan Comments:         Anesthesia Quick Evaluation

## 2019-10-03 NOTE — Discharge Instructions (Addendum)
As we discussed, your potassium and sodium levels were a little bit low tonight as result of your bowel prep and your vomiting.  We provided nausea medicine, IV fluids, and electrolytes to help.  Please go directly to the medical mall to check in for your colonoscopy.  I sent electronic prescriptions for Zofran (nausea medicine) and potassium supplements to your pharmacy that you or your family can pick up later today that you can take as needed over the next few days.  Follow-up with your regular doctor or Dr. Alice Reichert at the next available opportunity and be sure to read through the included information about rehydrating herself after the procedure.

## 2019-10-03 NOTE — H&P (Signed)
Outpatient short stay form Pre-procedure 10/03/2019 1:37 PM Ann Young, M.D.  Primary Physician: Vernie Murders, PA  Reason for visit:  Dysphagia, GERD, Personal hx of colon cancer, personal hx of colon polyps.  History of present illness: Pleasant 79 y/o female with hx of GERD presents with intermittent solid food dysphagia responding favorably to esophageal dilation performed by Dr. Vira Agar around 2017. Patient has remote history of colon cancer in 1999 and adenomatous colon polyps on several colonoscopies, last about 3 years ago. Patient denies change in bowel habits, rectal bleeding, weight loss or abdominal pain.     Current Facility-Administered Medications:  .  0.9 %  sodium chloride infusion, , Intravenous, Continuous, Koppel, Benay Pike, MD, Last Rate: 20 mL/hr at 10/03/19 1239, New Bag at 10/03/19 1239  Medications Prior to Admission  Medication Sig Dispense Refill Last Dose  . alendronate (FOSAMAX) 70 MG tablet Take 1 tablet (70 mg total) by mouth once a week. Reported on 08/18/2015 12 tablet 3 Past Week at Unknown time  . Ascorbic Acid (VITAMIN C) 1000 MG tablet Take 1,000 mg by mouth daily.   Past Week at Unknown time  . aspirin 81 MG tablet Take 81 mg by mouth daily.   Past Week at Unknown time  . busPIRone (BUSPAR) 10 MG tablet Take 10 mg by mouth daily as needed.   Past Week at Unknown time  . Calcium Carb-Cholecalciferol 600-200 MG-UNIT TABS Take 1 tablet by mouth 2 (two) times daily.   Past Week at Unknown time  . cetirizine (ZYRTEC) 10 MG tablet TAKE ONE TABLET BY MOUTH ONCE DAILY FOR ALLERGIES 90 tablet 3 Past Week at Unknown time  . Cholecalciferol (VITAMIN D PO) Take by mouth.   Past Week at Unknown time  . fluticasone (FLONASE) 50 MCG/ACT nasal spray Place 1 spray into both nostrils daily.    Past Week at Unknown time  . lovastatin (MEVACOR) 20 MG tablet Take 20 mg by mouth at bedtime.   Past Week at Unknown time  . mirtazapine (REMERON) 30 MG tablet    Past Week  at Unknown time  . Omega-3 Fatty Acids (FISH OIL PO) Take 1 capsule by mouth daily.    Past Week at Unknown time  . pantoprazole (PROTONIX) 40 MG tablet Take 1 tablet by mouth once daily 90 tablet 2 Past Week at Unknown time  . potassium chloride SA (KLOR-CON M20) 20 MEQ tablet Take 1 tablet (20 mEq total) by mouth daily. 7 tablet 0 Past Week at Unknown time  . triamcinolone lotion (KENALOG) 0.1 % Apply 1 application topically daily as needed.    Past Week at Unknown time  . docusate sodium (COLACE) 100 MG capsule Take by mouth.     . hydrocortisone 2.5 % ointment      . ketoconazole (NIZORAL) 2 % cream      . nitrofurantoin, macrocrystal-monohydrate, (MACROBID) 100 MG capsule Take 1 capsule (100 mg total) by mouth 2 (two) times daily. 20 capsule 0   . ondansetron (ZOFRAN ODT) 4 MG disintegrating tablet Allow 1-2 tablets to dissolve in your mouth every 8 hours as needed for nausea/vomiting 30 tablet 0      Allergies  Allergen Reactions  . Atorvastatin Other (See Comments)    Mayalgia Other reaction(s): Other (See Comments), Other (See Comments) Mayalgia Mayalgia  . Clarithromycin Other (See Comments)    Questionable Biaxin vs Flagyl with chest discomfort & irregular heartbeat (most likely thought to be related to the Biaxin) Other reaction(s): Other (  See Comments) Questionable Biaxin vs Flagyl with chest discomfort & irregular heartbeat (most likely thought to be related to the Biaxin) Questionable Biaxin vs Flagyl with chest discomfort & irregular heartbeat (most likely thought to be related to the Biaxin)  . Fesoterodine     Other reaction(s): Other (See Comments) weakness Other reaction(s): Other (See Comments) Other reaction(s): Other (See Comments) weakness weakness  . Ibuprofen Other (See Comments)    To prevent swelling,increase hr/bp Other reaction(s): Other (See Comments) To prevent swelling,increase hr/b PATIENT STATES NOT ALLERGIC TO IBUPROFEN  . Lamotrigine Other (See  Comments)    Hair thinning Hair thinning Other reaction(s): Unknown Hair thinning Hair thinning   . Metronidazole Other (See Comments)    Questionable Biaxin vs Flagyl with chest discomfort & irregular heartbeat (most likely thought to be related to the Biaxin) Other reaction(s): Other (See Comments), Other (See Comments) Questionable Biaxin vs Flagyl with chest discomfort & irregular heartbeat (most likely thought to be related to the Biaxin) Questionable Biaxin vs Flagyl with chest discomfort & irregular heartbeat (most likely thought to be related to the Biaxin)  . Other Other (See Comments)    decongestants  . Penicillins Nausea Only  . Simvastatin Other (See Comments)    Hari Loss Other reaction(s): Other (See Comments), Other (See Comments) Hari Loss Hari Loss  . Sulfa Antibiotics     Other reaction(s): Unknown Other reaction(s): Unknown Patient cant remember  . Toviaz  [Fesoterodine Fumarate Er] Other (See Comments)    weakness     Past Medical History:  Diagnosis Date  . Anxiety   . Arthritis   . Asthma   . Cancer ([place])    colorectal  . Cataract cortical, senile, bilateral   . Depression   . Dyspepsia   . Dyspepsia   . Elevated cholesterol   . Esophagitis   . Gastritis   . GERD (gastroesophageal reflux disease)   . Helicobacter pylori (H. pylori) infection   . Hemorrhoids   . Hiatal hernia   . History of hiatal hernia   . Hx of adenomatous colonic polyps   . Osteoporosis   . Peptic ulcer disease   . Peptic ulcer disease   . Shingles     Review of systems:  Otherwise negative.    Physical Exam  Gen: Alert, oriented. Appears stated age.  HEENT: [place]/AT. PERRLA. Lungs: CTA, no wheezes. CV: RR nl S1, S2. Abd: soft, benign, no masses. BS+ Ext: No edema. Pulses 2+    Planned procedures: Proceed with EGD and colonoscopy. The patient understands the nature of the planned procedure, indications, risks, alternatives and potential complications  including but not limited to bleeding, infection, perforation, damage to internal organs and possible oversedation/side effects from anesthesia. The patient agrees and gives consent to proceed.  Please refer to procedure notes for findings, recommendations and patient disposition/instructions.     Minnie Legros K. Alice Young, M.D. Gastroenterology 10/03/2019  1:37 PM

## 2019-10-03 NOTE — Interval H&P Note (Signed)
History and Physical Interval Note:  10/03/2019 1:39 PM  Ann Young  has presented today for surgery, with the diagnosis of Hx Colon Cancer; Dysphagia.  The various methods of treatment have been discussed with the patient and family. After consideration of risks, benefits and other options for treatment, the patient has consented to  Procedure(s): ESOPHAGOGASTRODUODENOSCOPY (EGD) WITH PROPOFOL (N/A) COLONOSCOPY (N/A) as a surgical intervention.  The patient's history has been reviewed, patient examined, no change in status, stable for surgery.  I have reviewed the patient's chart and labs.  Questions were answered to the patient's satisfaction.     [place], [place]

## 2019-10-03 NOTE — ED Triage Notes (Signed)
Patient ambulatory to triage with steady gait, without difficulty or distress noted, mask in place; pt performing colonoscopy prep since yesterday (sched for 1230 today); began having N/V at 3am, HA and abd "burning"; st hx of same with prep and was dx with ?hyponatremia

## 2019-10-03 NOTE — ED Provider Notes (Signed)
[facility] Emergency Department Provider Note  ____________________________________________   First MD Initiated Contact with Patient 10/03/19 0600     (approximate)  I have reviewed the triage vital signs and the nursing notes.   HISTORY  Chief Complaint Emesis    HPI Ann Young is a 79 y.o. female with medical history as listed below who has plans for a colonoscopy by Dr. Alice Reichert at 12:30 PM today.  She presents tonight for evaluation of persistent nausea and vomiting over the last couple of hours.  She began bowel prep as per the provided instructions using MiraLAX, stool softeners, and bowel stimulants, at about noon yesterday.  She seemed to be doing well until about 3:00 this morning when she developed nausea and vomiting.  She reports that she vomited twice at home with persistent nausea and then had a large volume emesis episode on the way into the emergency department.  She reports that 3 years ago during another colonoscopy prep she had a similar experience and had to go to the emergency department and was told that she had hyponatremia or hypokalemia (she cannot remember which) as a result of the bowel prep.  She still feels nauseated at this time.  She is having a little bit of burning upper abdominal discomfort which she says may be the result of all of the vomiting.  No lower abdominal pain or cramping.  She is continued to have liquid bowel movements which she says have turned to clear.  She denies fever/chills, sore throat, chest pain, shortness of breath, cough.  She describes the symptoms as severe and relatively acute in onset during the night as described above.  Nothing in particular makes them better or worse.         Past Medical History:  Diagnosis Date  . Anxiety   . Arthritis   . Asthma   . Cancer ([place])    colorectal  . Cataract cortical, senile, bilateral   . Depression   . Dyspepsia   . Dyspepsia   . Elevated  cholesterol   . Esophagitis   . Gastritis   . GERD (gastroesophageal reflux disease)   . Helicobacter pylori (H. pylori) infection   . Hemorrhoids   . Hiatal hernia   . History of hiatal hernia   . Hx of adenomatous colonic polyps   . Osteoporosis   . Peptic ulcer disease   . Peptic ulcer disease   . Shingles     Patient Active Problem List   Diagnosis Date Noted  . Pelvic pain in female 06/06/2019  . Thickened endometrium 07/04/2017  . Ketonuria 09/03/2016  . Chronic reflux esophagitis 07/20/2016  . Multiple gastric polyps 07/20/2016  . History of allergy 08/18/2015  . Osteoporosis 05/07/2015  . Constipation, chronic 05/07/2015  . History of colorectal cancer 05/07/2015  . Depression with anxiety 05/07/2015  . Arthritis 05/07/2015  . Mixed hyperlipidemia 05/07/2015  . GERD with stricture 05/07/2015  . Low back pain 02/10/2015  . Problems influencing health status 10/28/2014  . Malignant neoplasm ([place]) 10/28/2014  . History of colonic polyps 10/28/2014  . Anxiety and depression 10/28/2014  . Acid reflux 10/28/2014  . History of malignant neoplasm of large intestine 10/28/2014  . 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 Gastric ulcer 10/28/2014  . 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 Awareness of heartbeats 03/25/2014    Past Surgical History:  Procedure Laterality Date  . BREAST BIOPSY Left 2000   benign  .  BREAST CYST ASPIRATION    . CARDIAC CATHETERIZATION  2015   WNL  . CATARACT EXTRACTION Bilateral 2013  . CATARACT EXTRACTION, BILATERAL Bilateral 2013   Dr. Willy Eddy  . COLON SURGERY    . COLONOSCOPY WITH PROPOFOL N/A 02/04/2016   Procedure: COLONOSCOPY WITH PROPOFOL;  Surgeon: Manya Silvas, MD;  Location: [facility] ENDOSCOPY;  Service: Endoscopy;  Laterality: N/A;  . ESOPHAGOGASTRODUODENOSCOPY (EGD) WITH PROPOFOL N/A 02/04/2016   Procedure: ESOPHAGOGASTRODUODENOSCOPY (EGD) WITH PROPOFOL;  Surgeon: Manya Silvas, MD;  Location: [facility] ENDOSCOPY;  Service: Endoscopy;  Laterality: N/A;  . HYSTEROSCOPY WITH D & C N/A 02/04/2017   Procedure: DILATATION AND CURETTAGE /HYSTEROSCOPY;  Surgeon: Schermerhorn, Gwen Her, MD;  Location: [facility] ORS;  Service: Gynecology;  Laterality: N/A;  . resection of colorectal cancer without sequela  1999  . resection of colorectal cancer without sequela    . TUBAL LIGATION  1976    Prior to Admission medications   Medication Sig Start Date End Date Taking? Authorizing Provider  alendronate (FOSAMAX) 70 MG tablet Take 1 tablet (70 mg total) by mouth once a week. Reported on 08/18/2015 11/14/18   Chrismon, Vickki Muff, PA  Ascorbic Acid (VITAMIN C) 1000 MG tablet Take 1,000 mg by mouth daily.    [provider]  aspirin 81 MG tablet Take 81 mg by mouth daily.    [provider]  Calcium Carb-Cholecalciferol 600-200 MG-UNIT TABS Take 1 tablet by mouth 2 (two) times daily.    [provider]  cetirizine (ZYRTEC) 10 MG tablet TAKE ONE TABLET BY MOUTH ONCE DAILY FOR ALLERGIES 11/02/18   Chrismon, Vickki Muff, PA  Cholecalciferol (VITAMIN D PO) Take by mouth.    [provider]  docusate sodium (COLACE) 100 MG capsule Take by mouth.    [provider]  fluticasone (FLONASE) 50 MCG/ACT nasal spray Place 1 spray into both nostrils daily.  10/08/16   [provider]  hydrocortisone 2.5 % ointment  03/02/19   [provider]  ketoconazole (NIZORAL) 2 % cream  03/02/19   [provider]  mirtazapine (REMERON) 30 MG tablet  01/04/18   [provider]  nitrofurantoin, macrocrystal-monohydrate, (MACROBID) 100 MG capsule Take 1 capsule (100 mg total) by mouth 2 (two) times daily. 06/06/19   Flinchum, Kelby Aline, FNP  Omega-3 Fatty Acids (FISH OIL PO) Take 1 capsule by mouth daily.     [provider]  omeprazole (PRILOSEC) 40 MG capsule Take by mouth.    [provider]  ondansetron (ZOFRAN ODT) 4 MG  disintegrating tablet Allow 1-2 tablets to dissolve in your mouth every 8 hours as needed for nausea/vomiting 10/03/19   Hinda Kehr, MD  pantoprazole (PROTONIX) 40 MG tablet Take 1 tablet by mouth once daily 09/24/19   Chrismon, Simona Huh E, PA  potassium chloride SA (KLOR-CON M20) 20 MEQ tablet Take 1 tablet (20 mEq total) by mouth daily. 10/03/19   Hinda Kehr, MD  solifenacin (VESICARE) 5 MG tablet Take by mouth. 11/02/18   [provider]  triamcinolone lotion (KENALOG) 0.1 % Apply 1 application topically daily as needed.     [provider]    Allergies Atorvastatin, Clarithromycin, Fesoterodine, Ibuprofen, Lamotrigine, Metronidazole, Other, Penicillins, Simvastatin, Sulfa antibiotics, and Toviaz  [fesoterodine fumarate er]  Family History  Problem Relation Age of Onset  . Cirrhosis Sister   . Colon polyps Sister   . Leukemia Mother   . Depression Mother   . Heart disease Father   . Depression  Father   . Bipolar disorder Daughter   . Prostate cancer Neg Hx   . Kidney cancer Neg Hx   . Breast cancer Neg Hx     Social History Social History   Tobacco Use  . Smoking status: Never Smoker  . Smokeless tobacco: Never Used  Substance Use Topics  . Alcohol use: No    Alcohol/week: 0.0 standard drinks  . Drug use: No    Review of Systems Constitutional: No fever/chills Eyes: No visual changes. ENT: No sore throat. Cardiovascular: Denies chest pain. Respiratory: Denies shortness of breath. Gastrointestinal: Nausea and vomiting in the setting of bowel prep for colonoscopy.  Some burning upper abdominal discomfort after multiple episodes of vomiting. Genitourinary: Negative for dysuria. Musculoskeletal: Negative for neck pain.  Negative for back pain. Integumentary: Negative for rash. Neurological: Negative for headaches, focal weakness or numbness.   ____________________________________________   PHYSICAL EXAM:  VITAL SIGNS: ED Triage Vitals [10/03/19  0541]  Enc Vitals Group     BP (!) 142/57     Pulse Rate 79     Resp 18     Temp 97.8 F (36.6 C)     Temp Source Oral     SpO2 95 %     Weight 58.5 kg (129 lb)     Height 1.524 m (5')     Head Circumference      Peak Flow      Pain Score 8     Pain Loc      Pain Edu?      Excl. in [place]?     Constitutional: Alert and oriented.  Appears uncomfortable but not in severe distress. Eyes: Conjunctivae are normal.  Head: Atraumatic. Nose: No congestion/rhinnorhea. Mouth/Throat: Patient is wearing a mask. Neck: No stridor.  No meningeal signs.   Cardiovascular: Normal rate, regular rhythm. Good peripheral circulation. Grossly normal heart sounds. Respiratory: Normal respiratory effort.  No retractions. Gastrointestinal: Soft and nontender, even in the epigastrium.  No distention. Musculoskeletal: No lower extremity tenderness nor edema. No gross deformities of extremities. Neurologic:  Normal speech and language. No gross focal neurologic deficits are appreciated.  Skin:  Skin is warm, dry and intact. Psychiatric: Mood and affect are normal. Speech and behavior are normal.  ____________________________________________   LABS (all labs ordered are listed, but only abnormal results are displayed)  Labs Reviewed  CBC WITH DIFFERENTIAL/PLATELET - Abnormal; Notable for the following components:      Result Value   Platelets 140 (*)    All other components within normal limits  COMPREHENSIVE METABOLIC PANEL - Abnormal; Notable for the following components:   Sodium 130 (*)    Potassium 3.1 (*)    Chloride 95 (*)    Glucose, Bld 121 (*)    Calcium 8.7 (*)    All other components within normal limits  MAGNESIUM  LIPASE, BLOOD   ____________________________________________  EKG  No indication for emergent EKG.  I reviewed the last EKG on record which was from 2018 and she had no evidence of QTC prolongation at that  time. ____________________________________________  RADIOLOGY Ursula Alert, personally viewed and evaluated these images (plain radiographs) as part of my medical decision making, as well as reviewing the written report by the radiologist.  ED MD interpretation: No indication for emergent imaging  Official radiology report(s): No results found.  ____________________________________________   PROCEDURES   Procedure(s) performed (including Critical Care):  Procedures   ____________________________________________   INITIAL IMPRESSION / MDM / ASSESSMENT AND  PLAN / ED COURSE  As part of my medical decision making, I reviewed the following data within the [place] History obtained from family, Nursing notes reviewed and incorporated, Labs reviewed , Old EKG reviewed, Old chart reviewed, Patient signed out to Dr. Joan Mayans and reviewed Notes from prior ED visits   Differential diagnosis includes, but is not limited to, gastritis secondary to bowel prep, SBO/ileus, medication or drug side effect, less likely acute intra-abdominal infection.  The patient is awake and alert and not in severe distress even though she appears uncomfortable from the nausea.  I have ordered 500 mL normal saline bolus, Zofran 4 mg IV, and basic lab work mostly to assess her electrolytes.  She does not want to stay in the hospital and agrees it would be best if she feels better and can go to her colonoscopy, particularly because she has been successful with the bowel prep and is passing only clear liquid.  There is no indication of an emergent medical condition at this time but I will reassess after her lab work is back.   Clinical Course as of Oct 02 708  Wed Oct 03, 2019  0619 Reassuring CBC  CBC with Differential/PlateletMarland Kitchen [CF]  [id number] Transferred ED care to Dr. Joan Mayans and updated charge RN Marya Amsler about the plan of care and disposition.   [CF]  [phone] Patient has a normal magnesium but a  decreased sodium and potassium level at 130 and 3.1 respectively.  Labs are otherwise generally reassuring.This may be essentially normal for an elderly patient undergoing a bowel prep who is also having nausea and vomiting and I do not think it represents the need for inpatient treatment.  I discussed it with the patient and with her daughter (with the patient's permission), and we decided on the following plan: I will start an infusion of normal saline with KCl 20 mEq/L at a rate of 100 mL/h after the completion of the normal saline 500 mL bolus.  I have also ordered 4 doses of potassium chloride 10 mEq IV to be administered over the next 4 hours.  The plan will be to discharge the patient around 11:30 AM - noon so that she has plenty of time to get to the medical mall for her procedure which is scheduled for a check-in at 12:30 PM.  I discussed this by phone with the daughter who agrees with the plan and I will update the daytime doctor, Dr. Joan Mayans, as well as the ED charge nurse.   [CF]    Clinical Course User Index [CF] Hinda Kehr, MD     ____________________________________________  FINAL CLINICAL IMPRESSION(S) / ED DIAGNOSES  Final diagnoses:  Nausea and vomiting, intractability of vomiting not specified, unspecified vomiting type  Hyponatremia  Hypokalemia     MEDICATIONS GIVEN DURING THIS VISIT:  Medications  potassium chloride 10 mEq in 100 mL IVPB (10 mEq Intravenous New Bag/Given 10/03/19 0650)  ondansetron (ZOFRAN) injection 4 mg (has no administration in time range)  ondansetron (ZOFRAN) injection 4 mg (4 mg Intravenous Given 10/03/19 0556)  sodium chloride 0.9 % bolus 500 mL (0 mLs Intravenous Stopped 10/03/19 0650)  0.9 % NaCl with KCl 20 mEq/ L  infusion ( Intravenous New Bag/Given 10/03/19 0650)     ED Discharge Orders         Ordered    ondansetron (ZOFRAN ODT) 4 MG disintegrating tablet     10/03/19 0609    potassium chloride SA (KLOR-CON M20) 20 MEQ tablet  Daily      10/03/19 0703          *Please note:  Jyra E Rylander was evaluated in Emergency Department on 10/03/2019 for the symptoms described in the history of present illness. She was evaluated in the context of the global COVID-19 pandemic, which necessitated consideration that the patient might be at risk for infection with the SARS-CoV-2 virus that causes COVID-19. Institutional protocols and algorithms that pertain to the evaluation of patients at risk for COVID-19 are in a state of rapid change based on information released by regulatory bodies including the CDC and federal and state organizations. These policies and algorithms were followed during the patient's care in the ED.  Some ED evaluations and interventions may be delayed as a result of limited staffing during the pandemic.*  Note:  This document was prepared using Dragon voice recognition software and may include unintentional dictation errors.   Hinda Kehr, MD 10/03/19 [phone]

## 2019-10-05 LAB — SURGICAL PATHOLOGY

## 2019-10-16 ENCOUNTER — Ambulatory Visit
Admission: RE | Admit: 2019-10-16 | Discharge: 2019-10-16 | Disposition: A | Discharge status: Home / Self Care | Payer: Medicare Other | Source: Ambulatory Visit | Attending: Family Medicine | Admitting: Family Medicine

## 2019-10-16 DIAGNOSIS — Z1231 Encounter for screening mammogram for malignant neoplasm of breast: Secondary | ICD-10-CM | POA: Insufficient documentation

## 2019-11-11 ENCOUNTER — Other Ambulatory Visit: Payer: Self-pay | Admitting: Family Medicine

## 2019-11-11 DIAGNOSIS — Z889 Allergy status to unspecified drugs, medicaments and biological substances status: Secondary | ICD-10-CM

## 2019-11-11 DIAGNOSIS — E782 Mixed hyperlipidemia: Secondary | ICD-10-CM

## 2019-11-11 NOTE — Telephone Encounter (Signed)
Requested Prescriptions  Pending Prescriptions Disp Refills  . EQ ALLERGY RELIEF, CETIRIZINE, 10 MG tablet [Pharmacy Med Name: EQ Allergy Relief (Cetirizine) 10 MG Oral Tablet] 90 tablet 2    Sig: TAKE 1 TABLET BY MOUTH ONCE DAILY FOR ALLERGIES     Ear, Nose, and Throat:  Antihistamines Passed - 11/11/2019 11:56 AM      Passed - Valid encounter within last 12 months    Recent Outpatient Visits          5 months ago Cystitis with hematuria   [facility], F[facility]   6 months ago Mixed hyperlipidemia   [facility], Vickki Muff, [place]   1 year ago Mixed hyperlipidemia   [facility], [place], [place]   1 year ago Subclinical hypothyroidism   [facility], Vickki Muff, [place]   2 years ago Hair thinning   [facility], [place], [place]             . lovastatin (MEVACOR) 40 MG tablet [Pharmacy Med Name: Lovastatin 40 MG Oral Tablet] 90 tablet     Sig: TAKE 1 TABLET BY MOUTH AT BEDTIME     Cardiovascular:  Antilipid - Statins Failed - 11/11/2019 11:56 AM      Failed - LDL in normal range and within 360 days    LDL Chol Calc (NIH)  Date Value Ref Range Status  04/23/2019 117 (H) 0 - 99 mg/dL Final         Passed - Total Cholesterol in normal range and within 360 days    Cholesterol, Total  Date Value Ref Range Status  04/23/2019 196 100 - 199 mg/dL Final         Passed - HDL in normal range and within 360 days    HDL  Date Value Ref Range Status  04/23/2019 63 >39 mg/dL Final         Passed - Triglycerides in normal range and within 360 days    Triglycerides  Date Value Ref Range Status  04/23/2019 86 0 - 149 mg/dL Final         Passed - Patient is not pregnant      Passed - Valid encounter within last 12 months    Recent Outpatient Visits          5 months ago Cystitis with hematuria   [facility], Kelby Aline, FNP   6 months ago Mixed  hyperlipidemia   [facility], Vickki Muff, [place]   1 year ago Mixed hyperlipidemia   [facility], Vickki Muff, [place]   1 year ago Subclinical hypothyroidism   [facility], Vickki Muff, [place]   2 years ago Hair thinning   [facility], [place], [place]

## 2019-11-19 NOTE — Progress Notes (Addendum)
Established patient visit   Patient: Ann Young   DOB: 1941/03/06   79 y.o. Female  MRN: [id number] Visit Date: 11/20/2019  Today's healthcare provider: Vernie Murders, PA   Chief Complaint  Patient presents with   Medicare Wellness   Subjective    HPI  The patient is a 79 year old female who presents today for her Medicare wellness exam.    Past Medical History:  Diagnosis Date   Anxiety    Arthritis    Asthma    Cancer ([place])    colorectal   Cataract cortical, senile, bilateral    Depression    Dyspepsia    Dyspepsia    Elevated cholesterol    Esophagitis    Gastritis    GERD (gastroesophageal reflux disease)    Helicobacter pylori (H. pylori) infection    Hemorrhoids    Hiatal hernia    History of hiatal hernia    Hx of adenomatous colonic polyps    Osteoporosis    Peptic ulcer disease    Peptic ulcer disease    Shingles    Past Surgical History:  Procedure Laterality Date   BREAST BIOPSY Left 2000   benign   BREAST CYST ASPIRATION     CARDIAC CATHETERIZATION  2015   WNL   CATARACT EXTRACTION Bilateral 2013   CATARACT EXTRACTION, BILATERAL Bilateral 2013   Dr. Willy Eddy   COLON SURGERY     COLONOSCOPY N/A 10/03/2019   Procedure: COLONOSCOPY;  Surgeon: Toledo, Benay Pike, MD;  Location: [facility] ENDOSCOPY;  Service: Gastroenterology;  Laterality: N/A;   COLONOSCOPY WITH PROPOFOL N/A 02/04/2016   Procedure: COLONOSCOPY WITH PROPOFOL;  Surgeon: Manya Silvas, MD;  Location: [facility] ENDOSCOPY;  Service: Endoscopy;  Laterality: N/A;   ESOPHAGOGASTRODUODENOSCOPY (EGD) WITH PROPOFOL N/A 02/04/2016   Procedure: ESOPHAGOGASTRODUODENOSCOPY (EGD) WITH PROPOFOL;  Surgeon: Manya Silvas, MD;  Location: [facility] ENDOSCOPY;  Service: Endoscopy;  Laterality: N/A;   ESOPHAGOGASTRODUODENOSCOPY (EGD) WITH PROPOFOL N/A 10/03/2019   Procedure: ESOPHAGOGASTRODUODENOSCOPY (EGD) WITH PROPOFOL;  Surgeon: Toledo, Benay Pike, MD;  Location: [facility] ENDOSCOPY;  Service:  Gastroenterology;  Laterality: N/A;   HYSTEROSCOPY WITH D & C N/A 02/04/2017   Procedure: DILATATION AND CURETTAGE /HYSTEROSCOPY;  Surgeon: Schermerhorn, Gwen Her, MD;  Location: [facility] ORS;  Service: Gynecology;  Laterality: N/A;   resection of colorectal cancer without sequela  1999   resection of colorectal cancer without sequela     TUBAL LIGATION  1976   Family History  Problem Relation Age of Onset   Cirrhosis Sister    Colon polyps Sister    Leukemia Mother    Depression Mother    Heart disease Father    Depression Father    Bipolar disorder Daughter    Prostate cancer Neg Hx    Kidney cancer Neg Hx    Breast cancer Neg Hx    Allergies  Allergen Reactions   Atorvastatin Other (See Comments)    Mayalgia Other reaction(s): Other (See Comments), Other (See Comments) Mayalgia Mayalgia   Clarithromycin Other (See Comments)    Questionable Biaxin vs Flagyl with chest discomfort & irregular heartbeat (most likely thought to be related to the Biaxin) Other reaction(s): Other (See Comments) Questionable Biaxin vs Flagyl with chest discomfort & irregular heartbeat (most likely thought to be related to the Biaxin) Questionable Biaxin vs Flagyl with chest discomfort & irregular heartbeat (most likely thought to be related to the Biaxin)   Fesoterodine     Other reaction(s): Other (See Comments)  weakness Other reaction(s): Other (See Comments) Other reaction(s): Other (See Comments) weakness weakness   Ibuprofen Other (See Comments)    To prevent swelling,increase hr/bp Other reaction(s): Other (See Comments) To prevent swelling,increase hr/b PATIENT STATES NOT ALLERGIC TO IBUPROFEN   Lamotrigine Other (See Comments)    Hair thinning Hair thinning Other reaction(s): Unknown Hair thinning Hair thinning    Metronidazole Other (See Comments)    Questionable Biaxin vs Flagyl with chest discomfort & irregular heartbeat (most likely thought to be related to the Biaxin) Other  reaction(s): Other (See Comments), Other (See Comments) Questionable Biaxin vs Flagyl with chest discomfort & irregular heartbeat (most likely thought to be related to the Biaxin) Questionable Biaxin vs Flagyl with chest discomfort & irregular heartbeat (most likely thought to be related to the Biaxin)   Other Other (See Comments)    decongestants   Penicillins Nausea Only   Simvastatin Other (See Comments)    Hari Loss Other reaction(s): Other (See Comments), Other (See Comments) Hari Loss Hari Loss   Sulfa Antibiotics     Other reaction(s): Unknown Other reaction(s): Unknown Patient cant remember   Toviaz  [Fesoterodine Fumarate Er] Other (See Comments)    weakness   Social History   Tobacco Use   Smoking status: Never Smoker   Smokeless tobacco: Never Used  Substance Use Topics   Alcohol use: No    Alcohol/week: 0.0 standard drinks   Drug use: No    Medications: Outpatient Medications Prior to Visit  Medication Sig   alendronate (FOSAMAX) 70 MG tablet Take 1 tablet (70 mg total) by mouth once a week. Reported on 08/18/2015   Ascorbic Acid (VITAMIN C) 1000 MG tablet Take 1,000 mg by mouth daily.   aspirin 81 MG tablet Take 81 mg by mouth daily.   busPIRone (BUSPAR) 10 MG tablet Take 10 mg by mouth daily as needed.   Calcium Carb-Cholecalciferol 600-200 MG-UNIT TABS Take 1 tablet by mouth 2 (two) times daily.   Cholecalciferol (VITAMIN D PO) Take by mouth.   EQ ALLERGY RELIEF, CETIRIZINE, 10 MG tablet TAKE 1 TABLET BY MOUTH ONCE DAILY FOR ALLERGIES   fluticasone (FLONASE) 50 MCG/ACT nasal spray Place 1 spray into both nostrils daily.    hydrocortisone 2.5 % ointment    lovastatin (MEVACOR) 20 MG tablet Take 20 mg by mouth at bedtime.   mirtazapine (REMERON) 30 MG tablet    Omega-3 Fatty Acids (FISH OIL PO) Take 1 capsule by mouth daily.    ondansetron (ZOFRAN ODT) 4 MG disintegrating tablet Allow 1-2 tablets to dissolve in your mouth every 8 hours as needed for  nausea/vomiting   pantoprazole (PROTONIX) 40 MG tablet Take 1 tablet by mouth once daily   triamcinolone lotion (KENALOG) 0.1 % Apply 1 application topically daily as needed.    docusate sodium (COLACE) 100 MG capsule Take by mouth.   ketoconazole (NIZORAL) 2 % cream    [DISCONTINUED] nitrofurantoin, macrocrystal-monohydrate, (MACROBID) 100 MG capsule Take 1 capsule (100 mg total) by mouth 2 (two) times daily. (Patient not taking: Reported on 11/20/2019)   [DISCONTINUED] potassium chloride SA (KLOR-CON M20) 20 MEQ tablet Take 1 tablet (20 mEq total) by mouth daily. (Patient not taking: Reported on 11/20/2019)   No facility-administered medications prior to visit.    Review of Systems  Constitutional: Positive for fatigue.  HENT: Negative.   Eyes: Positive for itching.  Respiratory: Negative.   Cardiovascular: Negative.   Gastrointestinal: Positive for constipation.  Endocrine: Negative.   Genitourinary: Negative.  Musculoskeletal: Positive for back pain.  Skin: Negative.   Allergic/Immunologic: Negative.   Neurological: Negative.   Hematological: Negative.   Psychiatric/Behavioral: The patient is nervous/anxious.       Objective    BP 110/62 (BP Location: Right Arm, Patient Position: Sitting, Cuff Size: Normal)   Pulse 80   Temp (!) 97.1 F (36.2 C) (Skin)   Ht 5' (1.524 m)   Wt 128 lb (58.1 kg)   SpO2 95%   BMI 25.00 kg/m    Physical Exam Constitutional:      Appearance: Normal appearance. She is normal weight.  HENT:     Head: Normocephalic and atraumatic.     Right Ear: Tympanic membrane, ear canal and external ear normal.     Left Ear: Tympanic membrane, ear canal and external ear normal.     Nose: Nose normal.     Mouth/Throat:     Mouth: Mucous membranes are moist.     Pharynx: Oropharynx is clear.  Eyes:     Extraocular Movements: Extraocular movements intact.     Conjunctiva/sclera: Conjunctivae normal.     Pupils: Pupils are equal, round, and reactive to  light.  Cardiovascular:     Rate and Rhythm: Normal rate and regular rhythm.     Pulses: Normal pulses.     Heart sounds: Normal heart sounds.  Pulmonary:     Effort: Pulmonary effort is normal.     Breath sounds: Normal breath sounds.  Abdominal:     General: Abdomen is flat. Bowel sounds are normal.     Palpations: Abdomen is soft.  Musculoskeletal:        General: Normal range of motion.     Cervical back: Normal range of motion and neck supple.  Skin:    General: Skin is warm and dry.  Neurological:     General: No focal deficit present.     Mental Status: She is alert and oriented to person, place, and time. Mental status is at baseline.  Psychiatric:        Mood and Affect: Mood normal.        Behavior: Behavior normal.        Thought Content: Thought content normal.        Judgment: Judgment normal.      No results found for any visits on 11/20/19.    Assessment & Plan     1. Mixed hyperlipidemia Still on the Lovastatin without side effects. Continues to control weight well. LDL was only 117 on 04-23-19 with this regimen. Recheck labs and follow up pending reports. Saw her cardiologist (Dr. Nehemiah Massed) on 10-21-19) who recommended moderate to high intensity cholesterol therapy to reduce CVD and complications. - TSH - Lipid panel - Comprehensive metabolic panel  2. Osteoporosis, unspecified osteoporosis type, unspecified pathological fracture presence Tolerating Alendronate 70 mg q week. BMD in 2013 showed a lumbar T score of -2.9. Will recheck labs with vitamin D level and encouraged to continue Alendronate. Should consider recheck of BMD but reluctant at this time. - CBC with Differential/Platelet - VITAMIN D 25 Hydroxy (Vit-D Deficiency, Fractures)  3. History of colorectal cancer  History of colorectal cancer in 1999 with anastomosis of distal colon. Last colonoscopy was 10-03-19 by Dr. Alice Reichert without signs of recurrence.  4. Chronic reflux esophagitis Continues  Protonix 40 mg qd for dyspepsia. Upper endoscopy on 10-03-19 by Dr. Alice Reichert showed multiple benign gastric polyps. Recheck CBC and follow up with GI as planned. - CBC with Differential/Platelet  5. Anxiety and depression Followed by psychiatrist (Dr. Nicolasa Ducking). Continues Buspar and Remeron with fair control. Recheck follow up labs. - CBC with Differential/Platelet - TSH - Comprehensive metabolic panel  6. Subclinical hypothyroidism History of elevated TSH in 2019. Last check on 04-23-19 was normal and not taking any thyroid supplement. Recheck level. - TSH   No follow-ups on file.      Andres Shad, PA, have reviewed all documentation for this visit. The documentation on 11/20/19 for the exam, diagnosis, procedures, and orders are all accurate and complete.    Vernie Murders, [place] [phone] (phone) [phone] (fax)  [place]

## 2019-11-20 ENCOUNTER — Ambulatory Visit (INDEPENDENT_AMBULATORY_CARE_PROVIDER_SITE_OTHER): Payer: Medicare Other | Admitting: Family Medicine

## 2019-11-20 ENCOUNTER — Other Ambulatory Visit: Payer: Self-pay

## 2019-11-20 VITALS — BP 110/62 | HR 80 | Temp 97.1°F | Ht 60.0 in | Wt 128.0 lb

## 2019-11-20 DIAGNOSIS — F329 Major depressive disorder, single episode, unspecified: Secondary | ICD-10-CM

## 2019-11-20 DIAGNOSIS — E782 Mixed hyperlipidemia: Secondary | ICD-10-CM

## 2019-11-20 DIAGNOSIS — F419 Anxiety disorder, unspecified: Secondary | ICD-10-CM

## 2019-11-20 DIAGNOSIS — Z85048 Personal history of other malignant neoplasm of rectum, rectosigmoid junction, and anus: Secondary | ICD-10-CM

## 2019-11-20 DIAGNOSIS — M810 Age-related osteoporosis without current pathological fracture: Secondary | ICD-10-CM

## 2019-11-20 DIAGNOSIS — K2100 Gastro-esophageal reflux disease with esophagitis, without bleeding: Secondary | ICD-10-CM | POA: Diagnosis not present

## 2019-11-20 DIAGNOSIS — E038 Other specified hypothyroidism: Secondary | ICD-10-CM

## 2019-11-20 DIAGNOSIS — F32A Depression, unspecified: Secondary | ICD-10-CM

## 2019-11-20 DIAGNOSIS — E039 Hypothyroidism, unspecified: Secondary | ICD-10-CM

## 2019-11-21 LAB — VITAMIN D 25 HYDROXY (VIT D DEFICIENCY, FRACTURES): Vit D, 25-Hydroxy: 44.6 ng/mL (ref 30.0–100.0)

## 2019-11-21 LAB — COMPREHENSIVE METABOLIC PANEL
ALT: 15 IU/L (ref 0–32)
AST: 20 IU/L (ref 0–40)
Albumin/Globulin Ratio: 2.5 — ABNORMAL HIGH (ref 1.2–2.2)
Albumin: 4.7 g/dL (ref 3.7–4.7)
Alkaline Phosphatase: 50 IU/L (ref 48–121)
BUN/Creatinine Ratio: 22 (ref 12–28)
BUN: 18 mg/dL (ref 8–27)
Bilirubin Total: 0.3 mg/dL (ref 0.0–1.2)
CO2: 26 mmol/L (ref 20–29)
Calcium: 9.9 mg/dL (ref 8.7–10.3)
Chloride: 104 mmol/L (ref 96–106)
Creatinine, Ser: 0.83 mg/dL (ref 0.57–1.00)
GFR calc Af Amer: 79 mL/min/{1.73_m2} (ref >59)
GFR calc non Af Amer: 69 mL/min/{1.73_m2} (ref >59)
Globulin, Total: 1.9 g/dL (ref 1.5–4.5)
Glucose: 86 mg/dL (ref 65–99)
Potassium: 4 mmol/L (ref 3.5–5.2)
Sodium: 141 mmol/L (ref 134–144)
Total Protein: 6.6 g/dL (ref 6.0–8.5)

## 2019-11-21 LAB — CBC WITH DIFFERENTIAL/PLATELET
Basophils Absolute: 0.1 10*3/uL (ref 0.0–0.2)
Basos: 1 %
EOS (ABSOLUTE): 0.1 10*3/uL (ref 0.0–0.4)
Eos: 2 %
Hematocrit: 39.2 % (ref 34.0–46.6)
Hemoglobin: 12.8 g/dL (ref 11.1–15.9)
Immature Grans (Abs): 0 10*3/uL (ref 0.0–0.1)
Immature Granulocytes: 0 %
Lymphocytes Absolute: 1.8 10*3/uL (ref 0.7–3.1)
Lymphs: 33 %
MCH: 31.1 pg (ref 26.6–33.0)
MCHC: 32.7 g/dL (ref 31.5–35.7)
MCV: 95 fL (ref 79–97)
Monocytes Absolute: 0.5 10*3/uL (ref 0.1–0.9)
Monocytes: 9 %
Neutrophils Absolute: 3 10*3/uL (ref 1.4–7.0)
Neutrophils: 55 %
Platelets: 167 10*3/uL (ref 150–450)
RBC: 4.11 x10E6/uL (ref 3.77–5.28)
RDW: 11.8 % (ref 11.7–15.4)
WBC: 5.4 10*3/uL (ref 3.4–10.8)

## 2019-11-21 LAB — LIPID PANEL
Chol/HDL Ratio: 3.3 ratio (ref 0.0–4.4)
Cholesterol, Total: 194 mg/dL (ref 100–199)
HDL: 59 mg/dL (ref >39)
LDL Chol Calc (NIH): 115 mg/dL — ABNORMAL HIGH (ref 0–99)
Triglycerides: 114 mg/dL (ref 0–149)
VLDL Cholesterol Cal: 20 mg/dL (ref 5–40)

## 2019-11-21 LAB — TSH: TSH: 3.94 u[IU]/mL (ref 0.450–4.500)

## 2019-11-22 ENCOUNTER — Telehealth: Payer: Self-pay

## 2019-11-22 ENCOUNTER — Other Ambulatory Visit: Payer: Self-pay

## 2019-11-22 MED ORDER — LOVASTATIN 40 MG PO TABS: 40.0000 mg | ORAL_TABLET | Freq: Every day | ORAL | 3 refills | Status: DC

## 2019-11-22 NOTE — Telephone Encounter (Signed)
Completed.

## 2019-11-22 NOTE — Telephone Encounter (Signed)
Attempted to contact patient, no answer or voicemail. Okay for PEC to advise patient.  

## 2019-11-22 NOTE — Telephone Encounter (Signed)
Patient returned call and was read lab note by dennis Crismon written 11/22/19. She states she will need now RX for increased dose of Lovastatin. Please send to [facility] on [address]

## 2019-11-22 NOTE — Telephone Encounter (Signed)
-----   Message from Margo Common, [place] sent at 11/22/2019 10:56 AM EDT ----- All blood tests are normal except LDL cholesterol still above goal of <100. If still taking Lovastatin at bedtime, increase to 40 mg and recheck lipids in 3 months.

## 2019-11-28 ENCOUNTER — Telehealth: Payer: Self-pay

## 2019-11-28 NOTE — Telephone Encounter (Signed)
Patient reports that she has been taking Lovastatin 40 mg daily for several months. Please advise.

## 2019-11-28 NOTE — Telephone Encounter (Signed)
Copied from [place] [id number]. Topic: General - Other >> Nov 28, 2019  3:51 PM Rainey Pines A wrote: Patient is requesting a callback from [place] on clarification on the dose and directions for her cholesterol medication. Please advise

## 2019-11-29 ENCOUNTER — Other Ambulatory Visit: Payer: Self-pay | Admitting: Gastroenterology

## 2019-11-29 DIAGNOSIS — R1013 Epigastric pain: Secondary | ICD-10-CM

## 2019-11-29 NOTE — Telephone Encounter (Signed)
Continue the 40 mg each evening and follow low fat diet. Recheck lipids in 3 months to see if additional medications or change to Crestor will be needed.

## 2019-11-30 NOTE — Telephone Encounter (Signed)
Patient advised.

## 2019-12-06 ENCOUNTER — Ambulatory Visit
Admission: RE | Admit: 2019-12-06 | Discharge: 2019-12-06 | Disposition: A | Discharge status: Home / Self Care | Payer: Medicare Other | Source: Ambulatory Visit | Attending: Gastroenterology | Admitting: Gastroenterology

## 2019-12-06 ENCOUNTER — Other Ambulatory Visit: Payer: Self-pay

## 2019-12-06 DIAGNOSIS — R1013 Epigastric pain: Secondary | ICD-10-CM | POA: Diagnosis present

## 2019-12-07 ENCOUNTER — Ambulatory Visit: Payer: Medicare Other | Admitting: Family Medicine

## 2020-03-11 ENCOUNTER — Telehealth: Payer: Self-pay

## 2020-03-11 ENCOUNTER — Other Ambulatory Visit: Payer: Self-pay | Admitting: Family Medicine

## 2020-03-11 MED ORDER — LOVASTATIN 40 MG PO TABS: 40.0000 mg | ORAL_TABLET | Freq: Every day | ORAL | 3 refills | Status: DC

## 2020-03-11 NOTE — Telephone Encounter (Signed)
Schedule appointment in the office and get fasting labs Friday morning. Sent refill to her pharmacy.

## 2020-03-11 NOTE — Telephone Encounter (Signed)
Copied from [place] [id number]. Topic: General - Inquiry >> Mar 11, 2020  9:06 AM Scherrie Gerlach wrote: Reason for CRM: pt instructed to repeat lipid panel in 3 months from 6/10.  Pt wants to come Friday. Pt will need a refill of lovastatin (MEVACOR) 40 MG tablet after that because Simona Huh only gave her a 3 mo supply in June. Can you please put the order in?

## 2020-03-12 NOTE — Telephone Encounter (Signed)
Please schedule, refill done

## 2020-03-14 ENCOUNTER — Other Ambulatory Visit: Payer: Self-pay | Admitting: Family Medicine

## 2020-03-14 DIAGNOSIS — E782 Mixed hyperlipidemia: Secondary | ICD-10-CM

## 2020-03-15 LAB — LIPID PANEL
Chol/HDL Ratio: 3.1 ratio (ref 0.0–4.4)
Cholesterol, Total: 191 mg/dL (ref 100–199)
HDL: 61 mg/dL (ref >39)
LDL Chol Calc (NIH): 115 mg/dL — ABNORMAL HIGH (ref 0–99)
Triglycerides: 80 mg/dL (ref 0–149)
VLDL Cholesterol Cal: 15 mg/dL (ref 5–40)

## 2020-03-18 ENCOUNTER — Other Ambulatory Visit: Payer: Self-pay

## 2020-03-18 ENCOUNTER — Encounter: Payer: Self-pay | Admitting: Family Medicine

## 2020-03-18 ENCOUNTER — Ambulatory Visit (INDEPENDENT_AMBULATORY_CARE_PROVIDER_SITE_OTHER): Payer: Medicare Other | Admitting: Family Medicine

## 2020-03-18 VITALS — BP 118/60 | HR 77 | Temp 98.4°F | Wt 128.0 lb

## 2020-03-18 DIAGNOSIS — E782 Mixed hyperlipidemia: Secondary | ICD-10-CM

## 2020-03-18 DIAGNOSIS — F419 Anxiety disorder, unspecified: Secondary | ICD-10-CM | POA: Diagnosis not present

## 2020-03-18 DIAGNOSIS — F32A Depression, unspecified: Secondary | ICD-10-CM | POA: Diagnosis not present

## 2020-03-18 DIAGNOSIS — Z23 Encounter for immunization: Secondary | ICD-10-CM

## 2020-03-18 MED ORDER — BUSPIRONE HCL 10 MG PO TABS: 10.0000 mg | ORAL_TABLET | Freq: Every day | ORAL | 1 refills | Status: DC | PRN

## 2020-03-18 NOTE — Progress Notes (Signed)
Established patient visit   Patient: Ann Young   DOB: May 15, 1941   79 y.o. Female  MRN: [id number] Visit Date: 03/18/2020  Today's healthcare provider: Vernie Murders, PA   No chief complaint on file.  Subjective    HPI  The patient is a 79 year old female who presents today for 3 month follow up of her cholesterol.  She had her labs done and results are in chart.  She is having no problems taking her medications or tolerating medications.   She also has requested that you take over the care and prescribing of her Mirtazapine and Buspirone.  She states that her psychiatrist has told her that she has been stable enough that she no longer had to come to her.   Past Medical History:  Diagnosis Date   Anxiety    Arthritis    Asthma    Cancer ([place])    colorectal   Cataract cortical, senile, bilateral    Depression    Dyspepsia    Dyspepsia    Elevated cholesterol    Esophagitis    Gastritis    GERD (gastroesophageal reflux disease)    Helicobacter pylori (H. pylori) infection    Hemorrhoids    Hiatal hernia    History of hiatal hernia    Hx of adenomatous colonic polyps    Osteoporosis    Peptic ulcer disease    Peptic ulcer disease    Shingles    Past Surgical History:  Procedure Laterality Date   BREAST BIOPSY Left 2000   benign   BREAST CYST ASPIRATION     CARDIAC CATHETERIZATION  2015   WNL   CATARACT EXTRACTION Bilateral 2013   CATARACT EXTRACTION, BILATERAL Bilateral 2013   Dr. Willy Eddy   COLON SURGERY     COLONOSCOPY N/A 10/03/2019   Procedure: COLONOSCOPY;  Surgeon: Toledo, Benay Pike, MD;  Location: [facility] ENDOSCOPY;  Service: Gastroenterology;  Laterality: N/A;   COLONOSCOPY WITH PROPOFOL N/A 02/04/2016   Procedure: COLONOSCOPY WITH PROPOFOL;  Surgeon: Manya Silvas, MD;  Location: [facility] ENDOSCOPY;  Service: Endoscopy;  Laterality: N/A;   ESOPHAGOGASTRODUODENOSCOPY (EGD) WITH PROPOFOL N/A 02/04/2016   Procedure: ESOPHAGOGASTRODUODENOSCOPY  (EGD) WITH PROPOFOL;  Surgeon: Manya Silvas, MD;  Location: [facility] ENDOSCOPY;  Service: Endoscopy;  Laterality: N/A;   ESOPHAGOGASTRODUODENOSCOPY (EGD) WITH PROPOFOL N/A 10/03/2019   Procedure: ESOPHAGOGASTRODUODENOSCOPY (EGD) WITH PROPOFOL;  Surgeon: Toledo, Benay Pike, MD;  Location: [facility] ENDOSCOPY;  Service: Gastroenterology;  Laterality: N/A;   HYSTEROSCOPY WITH D & C N/A 02/04/2017   Procedure: DILATATION AND CURETTAGE /HYSTEROSCOPY;  Surgeon: Schermerhorn, Gwen Her, MD;  Location: [facility] ORS;  Service: Gynecology;  Laterality: N/A;   resection of colorectal cancer without sequela  1999   resection of colorectal cancer without sequela     TUBAL LIGATION  1976   Social History   Tobacco Use   Smoking status: Never Smoker   Smokeless tobacco: Never Used  Vaping Use   Vaping Use: Never used  Substance Use Topics   Alcohol use: No    Alcohol/week: 0.0 standard drinks   Drug use: No   Family Status  Relation Name Status   Sister  Alive   Mother  Deceased at age 17   Father  Deceased at age 13   Brother  Deceased at age 94       suicide   Daughter  Alive   Son  Alive   Neg Hx  (Not Specified)   Allergies  Allergen Reactions   Atorvastatin Other (See Comments)    Mayalgia Other reaction(s): Other (See Comments), Other (See Comments) Mayalgia Mayalgia   Clarithromycin Other (See Comments)    Questionable Biaxin vs Flagyl with chest discomfort & irregular heartbeat (most likely thought to be related to the Biaxin) Other reaction(s): Other (See Comments) Questionable Biaxin vs Flagyl with chest discomfort & irregular heartbeat (most likely thought to be related to the Biaxin) Questionable Biaxin vs Flagyl with chest discomfort & irregular heartbeat (most likely thought to be related to the Biaxin)   Fesoterodine     Other reaction(s): Other (See Comments) weakness Other reaction(s): Other (See Comments) Other reaction(s): Other (See Comments) weakness weakness   Ibuprofen  Other (See Comments)    To prevent swelling,increase hr/bp Other reaction(s): Other (See Comments) To prevent swelling,increase hr/b PATIENT STATES NOT ALLERGIC TO IBUPROFEN   Lamotrigine Other (See Comments)    Hair thinning Hair thinning Other reaction(s): Unknown Hair thinning Hair thinning    Metronidazole Other (See Comments)    Questionable Biaxin vs Flagyl with chest discomfort & irregular heartbeat (most likely thought to be related to the Biaxin) Other reaction(s): Other (See Comments), Other (See Comments) Questionable Biaxin vs Flagyl with chest discomfort & irregular heartbeat (most likely thought to be related to the Biaxin) Questionable Biaxin vs Flagyl with chest discomfort & irregular heartbeat (most likely thought to be related to the Biaxin)   Other Other (See Comments)    decongestants   Penicillins Nausea Only   Simvastatin Other (See Comments)    Hari Loss Other reaction(s): Other (See Comments), Other (See Comments) Hari Loss Hari Loss   Sulfa Antibiotics     Other reaction(s): Unknown Other reaction(s): Unknown Patient cant remember   Toviaz  [Fesoterodine Fumarate Er] Other (See Comments)    weakness   Medications: Outpatient Medications Prior to Visit  Medication Sig   polyethylene glycol (MIRALAX / GLYCOLAX) 17 g packet Take 17 g by mouth daily.   alendronate (FOSAMAX) 70 MG tablet Take 1 tablet (70 mg total) by mouth once a week. Reported on 08/18/2015 (Patient not taking: Reported on 03/18/2020)   Ascorbic Acid (VITAMIN C) 1000 MG tablet Take 1,000 mg by mouth daily.   aspirin 81 MG tablet Take 81 mg by mouth daily.   busPIRone (BUSPAR) 10 MG tablet Take 10 mg by mouth daily as needed.   Calcium Carb-Cholecalciferol 600-200 MG-UNIT TABS Take 1 tablet by mouth 2 (two) times daily.   Cholecalciferol (VITAMIN D PO) Take by mouth.   docusate sodium (COLACE) 100 MG capsule Take by mouth.   EQ ALLERGY RELIEF, CETIRIZINE, 10 MG tablet TAKE 1 TABLET BY  MOUTH ONCE DAILY FOR ALLERGIES   fluticasone (FLONASE) 50 MCG/ACT nasal spray Place 1 spray into both nostrils daily.    hydrocortisone 2.5 % ointment  (Patient not taking: Reported on 03/18/2020)   ketoconazole (NIZORAL) 2 % cream  (Patient not taking: Reported on 03/18/2020)   lovastatin (MEVACOR) 40 MG tablet Take 1 tablet (40 mg total) by mouth at bedtime.   mirtazapine (REMERON) 30 MG tablet    Omega-3 Fatty Acids (FISH OIL PO) Take 1 capsule by mouth daily.    ondansetron (ZOFRAN ODT) 4 MG disintegrating tablet Allow 1-2 tablets to dissolve in your mouth every 8 hours as needed for nausea/vomiting (Patient not taking: Reported on 03/18/2020)   pantoprazole (PROTONIX) 40 MG tablet Take 1 tablet by mouth once daily   triamcinolone lotion (KENALOG) 0.1 % Apply 1 application topically  daily as needed.  (Patient not taking: Reported on 03/18/2020)   No facility-administered medications prior to visit.    Review of Systems  Respiratory: Negative for cough and shortness of breath.   Cardiovascular: Negative for chest pain, palpitations and leg swelling.  Gastrointestinal: Negative for abdominal pain.  Psychiatric/Behavioral: Positive for sleep disturbance (mild). Negative for agitation, confusion and decreased concentration. The patient is nervous/anxious.       Objective    BP 118/60 (BP Location: Right Arm, Patient Position: Sitting, Cuff Size: Normal)   Pulse 77   Temp 98.4 F (36.9 C) (Oral)   Wt 128 lb (58.1 kg)   SpO2 96%   BMI 25.00 kg/m  BP Readings from Last 3 Encounters:  03/18/20 118/60  11/20/19 110/62  10/03/19 132/78   Wt Readings from Last 3 Encounters:  03/18/20 128 lb (58.1 kg)  11/20/19 128 lb (58.1 kg)  10/03/19 128 lb 15.5 oz (58.5 kg)      Physical Exam Constitutional:      General: She is not in acute distress.    Appearance: She is well-developed.  HENT:     Head: Normocephalic and atraumatic.     Right Ear: Hearing normal.     Left Ear: Hearing  normal.     Nose: Nose normal.  Eyes:     General: Lids are normal. No scleral icterus.       Right eye: No discharge.        Left eye: No discharge.     Conjunctiva/sclera: Conjunctivae normal.  Pulmonary:     Effort: Pulmonary effort is normal. No respiratory distress.  Musculoskeletal:        General: Normal range of motion.  Skin:    Findings: No lesion or rash.  Neurological:     Mental Status: She is alert and oriented to person, place, and time.  Psychiatric:        Speech: Speech normal.        Behavior: Behavior normal.        Thought Content: Thought content normal.       No results found for any visits on 03/18/20.  Assessment & Plan     1. Anxiety and depression Dr. Nicolasa Ducking (psychiatrist) feels her depression and anxiety has been stable for a long time now. We will monitor for changes and maintain medications. Needs refill of Buspar 10 mg qd. Also, takes Remeron 30 mg hs. Recheck in 3-6 months.  2. Mixed hyperlipidemia Lipid panel on 03-14-20 showed LDL 100. Presently on Lovastatin 40 mg qd. Wants to continue this dosage and work on low fat diet with regular exercise and increased water intake. Recheck in 3-6 months.  3. Need for influenza vaccination - Flu Vaccine QUAD High Dose(Fluad)   No follow-ups on file.      Andres Shad, PA, have reviewed all documentation for this visit. The documentation on 03/18/20 for the exam, diagnosis, procedures, and orders are all accurate and complete.    Vernie Murders, [place] [phone] (phone) [phone] (fax)  [place]

## 2020-03-19 ENCOUNTER — Telehealth: Payer: Self-pay | Admitting: Family Medicine

## 2020-03-19 NOTE — Telephone Encounter (Signed)
Pt is calling to let dennis know that atorvastatin is covered on her [employer] medical insurance. Pt does not know which other chole med is on her formulary plan. [facility] [address]

## 2020-03-21 NOTE — Telephone Encounter (Signed)
Chart has this listed an a drug you had a muscle pain reaction to in the past and we had to stop it. Still recommend Zetia with your Lovastatin and recheck levels in 3 months.

## 2020-03-24 NOTE — Telephone Encounter (Signed)
Patient states that the Lovastatin is covered but the Zetia is not.  She said that it is very very expensive and she could not get it.   Is there anything else she can do in place of the Zetia

## 2020-03-24 NOTE — Telephone Encounter (Signed)
Patient advised and verbalized understanding.  Patient states she is on Miralax daily.  Instructed to hold the Miralax for a few days to see how she does with the Metamucil.  She is to call back for any problems.

## 2020-03-24 NOTE — Telephone Encounter (Signed)
Sorry Rosuvastatin is not covered she said

## 2020-03-24 NOTE — Telephone Encounter (Signed)
Could try to switch from the Lovastatin to Rosuvastatin (Crestor) 10 mg qd #30 & 3 RF and see if this is tolerated. Recheck blood levels as planned in 3 months.

## 2020-03-24 NOTE — Telephone Encounter (Signed)
Should continue the Lovastatin 40 mg qd and add Metamucil 3 teaspoon in a glass of water each evening.

## 2020-04-09 ENCOUNTER — Ambulatory Visit (INDEPENDENT_AMBULATORY_CARE_PROVIDER_SITE_OTHER): Payer: Medicare Other | Admitting: Dermatology

## 2020-04-09 ENCOUNTER — Other Ambulatory Visit: Payer: Self-pay

## 2020-04-09 ENCOUNTER — Encounter: Payer: Self-pay | Admitting: Dermatology

## 2020-04-09 DIAGNOSIS — L720 Epidermal cyst: Secondary | ICD-10-CM | POA: Diagnosis not present

## 2020-04-09 DIAGNOSIS — R202 Paresthesia of skin: Secondary | ICD-10-CM

## 2020-04-09 DIAGNOSIS — L219 Seborrheic dermatitis, unspecified: Secondary | ICD-10-CM

## 2020-04-09 DIAGNOSIS — L820 Inflamed seborrheic keratosis: Secondary | ICD-10-CM | POA: Diagnosis not present

## 2020-04-09 DIAGNOSIS — L821 Other seborrheic keratosis: Secondary | ICD-10-CM | POA: Diagnosis not present

## 2020-04-09 DIAGNOSIS — Z86007 Personal history of in-situ neoplasm of skin: Secondary | ICD-10-CM

## 2020-04-09 NOTE — Progress Notes (Signed)
   New Patient Visit  Subjective  Ann Young is a 79 y.o. female who presents for the following: check spot (R forehead, yrs, gets crusty), check spot (L leg, >1 yr, no symptoms), check spot (L upper arm, >78yr, itchy prn), check spots (back, ), and growth (L upper lip, growing).  She states she had a skin cancer treated on her R temple with topical 5FU cream x 4-6 weeks.   The following portions of the chart were reviewed this encounter and updated as appropriate:      Review of Systems:  No other skin or systemic complaints except as noted in HPI or Assessment and Plan.  Objective  Well appearing patient in no apparent distress; mood and affect are within normal limits.  A focused examination was performed including face, arms, legs, back. Relevant physical exam findings are noted in the Assessment and Plan.  Objective  L medial lower malar cheek: Smooth white papule(s).   Objective  R frontal hairline x 1, L lower shoulder x 1, R mid back x 1 (3): Erythematous keratotic or waxy stuck-on papule or plaque.   Objective  L upper lip: 4.59mm cystic pap  Objective  Mid Back: Back clear  Objective  Head - Anterior (Face): Mild scale hairline   Assessment & Plan  Milia L medial lower malar cheek  Benign, observe, discussed extraction if irritated  Inflamed seborrheic keratosis (3) R frontal hairline x 1, L lower shoulder x 1, R mid back x 1  Destruction of lesion - R frontal hairline x 1, L lower shoulder x 1, R mid back x 1  Destruction method: cryotherapy   Informed consent: discussed and consent obtained   Lesion destroyed using liquid nitrogen: Yes   Region frozen until ice ball extended beyond lesion: Yes   Outcome: patient tolerated procedure well with no complications   Post-procedure details: wound care instructions given    Epidermal cyst L upper lip  Benign, observe Discussed extraction if bothersome, pt will call and schedule when ready to  treat.  Notalgia paresthetica Mid Back  Chronic, recurrent Start TMC 0.1% cr qd/bid to itchy spot on back prn itch, avoid f/g/a (pt has tube)  Seborrheic dermatitis Head - Anterior (Face)  Chronic controlled Cont Ketoconazole 2% cream qd Cont HC 2.5% oint qd prn flares, then d/c   Seborrheic Keratoses - Stuck-on, waxy, tan-brown papules and plaques  - Discussed benign etiology and prognosis. - Observe - Call for any changes  Lentigines - Scattered tan macules - Discussed due to sun exposure - Benign, observe - Call for any changes   History of Squamous Cell Carcinoma in Situ of the Skin - No evidence of recurrence today - Recommend regular full body skin exams - Recommend daily broad spectrum sunscreen SPF 30+ to sun-exposed areas, reapply every 2 hours as needed.  - Call if any new or changing lesions are noted between office visits - R temple, clear  Return if symptoms worsen or fail to improve, for pt will call to schedule for appt to remove cyst.   I, Sonya Hupman, RMA, am acting as scribe for Brendolyn Patty, MD .  Documentation: I have reviewed the above documentation for accuracy and completeness, and I agree with the above.  Brendolyn Patty MD

## 2020-04-09 NOTE — Patient Instructions (Addendum)
For itchy spot on back start the Triamcinolone 0.1% cream 1-3 times a day when itching, avoid using on face, under arms and in groin.    Seborrheic Dermatitis face Use Ketoconazole 2% cream once daily to face Use Hydrocortisone 2.5% ointment once daily only when you have flares, once flare cleared stop the Hydrocortisone ointment and just use the Ketoconazole cream

## 2020-05-13 ENCOUNTER — Other Ambulatory Visit: Payer: Self-pay | Admitting: Family Medicine

## 2020-05-13 MED ORDER — MIRTAZAPINE 30 MG PO TABS: 30.0000 mg | ORAL_TABLET | Freq: Every day | ORAL | 3 refills | Status: DC

## 2020-05-13 NOTE — Telephone Encounter (Signed)
Requested medication (s) are due for refill today:   Provider to determine  Requested medication (s) are on the active medication list:   Yes from 2 yrs ago  Future visit scheduled:   No   Last ordered: 2 yrs ago by a historical provider reason returned.   Requested Prescriptions  Pending Prescriptions Disp Refills   mirtazapine (REMERON) 30 MG tablet        Psychiatry: Antidepressants - mirtazapine Passed - 05/13/2020  9:36 AM      Passed - AST in normal range and within 360 days    AST  Date Value Ref Range Status  11/20/2019 20 0 - 40 IU/L Final          Passed - ALT in normal range and within 360 days    ALT  Date Value Ref Range Status  11/20/2019 15 0 - 32 IU/L Final          Passed - Triglycerides in normal range and within 360 days    Triglycerides  Date Value Ref Range Status  03/14/2020 80 0 - 149 mg/dL Final          Passed - Total Cholesterol in normal range and within 360 days    Cholesterol, Total  Date Value Ref Range Status  03/14/2020 191 100 - 199 mg/dL Final          Passed - WBC in normal range and within 360 days    WBC  Date Value Ref Range Status  11/20/2019 5.4 3.4 - 10.8 x10E3/uL Final  10/03/2019 7.7 4.0 - 10.5 K/uL Final          Passed - Completed PHQ-2 or PHQ-9 in the last 360 days      Passed - Valid encounter within last 6 months    Recent Outpatient Visits           1 month ago Anxiety and depression   [facility], Vickki Muff, [place]   11 months ago Cystitis with hematuria   [place], Kelby Aline, FNP   1 year ago Mixed hyperlipidemia   [facility], Vickki Muff, [place]   1 year ago Mixed hyperlipidemia   [facility], [place], [place]   2 years ago Subclinical hypothyroidism   [facility], Vickki Muff, [place]       Future Appointments             In 3 months Brendolyn Patty, MD [place]

## 2020-05-13 NOTE — Telephone Encounter (Signed)
Copied from [place] [phone]. Topic: Quick Communication - Rx Refill/Question >> May 13, 2020  9:27 AM Leward Quan A wrote: Medication: mirtazapine (REMERON) 30 MG tablet   Has the patient contacted their pharmacy? Yes.   (Agent: If no, request that the patient contact the pharmacy for the refill.) (Agent: If yes, when and what did the pharmacy advise?)  Preferred Pharmacy (with phone number or street name): [facility] [place]  Phone:  [phone] Fax:  [phone]     Agent: Please be advised that RX refills may take up to 3 business days. We ask that you follow-up with your pharmacy.

## 2020-06-02 ENCOUNTER — Telehealth: Payer: Self-pay | Admitting: Family Medicine

## 2020-06-02 NOTE — Telephone Encounter (Signed)
Patient declined the Medicare Wellness Visit with [facility] - does not feel it is necessary - she stated she would call if she changes her mind to schedule

## 2020-06-11 ENCOUNTER — Other Ambulatory Visit: Payer: Self-pay | Admitting: Family Medicine

## 2020-06-11 MED ORDER — LOVASTATIN 40 MG PO TABS: 40.0000 mg | ORAL_TABLET | Freq: Every day | ORAL | 3 refills | Status: DC

## 2020-06-11 NOTE — Telephone Encounter (Signed)
Medication: lovastatin (MEVACOR) 40 MG tablet [[id number]]   Has the patient contacted their pharmacy? YES (Agent: If no, request that the patient contact the pharmacy for the refill.) (Agent: If yes, when and what did the pharmacy advise?)  Preferred Pharmacy (with phone number or street name): [facility] [address] [place]), [place] - [address]  Phone:  [phone] Fax:  [phone]     Agent: Please be advised that RX refills may take up to 3 business days. We ask that you follow-up with your pharmacy.

## 2020-07-11 DIAGNOSIS — N318 Other neuromuscular dysfunction of bladder: Secondary | ICD-10-CM | POA: Insufficient documentation

## 2020-08-11 ENCOUNTER — Other Ambulatory Visit: Payer: Self-pay | Admitting: Family Medicine

## 2020-08-11 DIAGNOSIS — Z889 Allergy status to unspecified drugs, medicaments and biological substances status: Secondary | ICD-10-CM

## 2020-08-26 ENCOUNTER — Other Ambulatory Visit: Payer: Self-pay | Admitting: Family Medicine

## 2020-08-26 DIAGNOSIS — Z889 Allergy status to unspecified drugs, medicaments and biological substances status: Secondary | ICD-10-CM

## 2020-08-26 NOTE — Telephone Encounter (Signed)
Requested Prescriptions  Pending Prescriptions Disp Refills  . cetirizine (ZYRTEC) 10 MG tablet [Pharmacy Med Name: Cetirizine HCl 10 MG Oral Tablet] 90 tablet 0    Sig: TAKE 1 TABLET BY MOUTH ONCE DAILY FOR ALLERGIES     Ear, Nose, and Throat:  Antihistamines Passed - 08/26/2020  2:32 PM      Passed - Valid encounter within last 12 months    Recent Outpatient Visits          5 months ago Anxiety and depression   [place], PA-C   1 year ago Cystitis with hematuria   [place] [facility], Kelby Aline, FNP   1 year ago Mixed hyperlipidemia   [facility], Vickki Muff, PA-C   2 years ago Mixed hyperlipidemia   [facility], Vickki Muff, PA-C   2 years ago Subclinical hypothyroidism   [facility], Vickki Muff, PA-C      Future Appointments            In 2 weeks Brendolyn Patty, MD [place]

## 2020-09-09 ENCOUNTER — Other Ambulatory Visit: Payer: Self-pay

## 2020-09-09 ENCOUNTER — Ambulatory Visit (INDEPENDENT_AMBULATORY_CARE_PROVIDER_SITE_OTHER): Payer: Medicare Other | Admitting: Dermatology

## 2020-09-09 DIAGNOSIS — L720 Epidermal cyst: Secondary | ICD-10-CM | POA: Diagnosis not present

## 2020-09-09 DIAGNOSIS — L578 Other skin changes due to chronic exposure to nonionizing radiation: Secondary | ICD-10-CM | POA: Diagnosis not present

## 2020-09-09 DIAGNOSIS — L820 Inflamed seborrheic keratosis: Secondary | ICD-10-CM

## 2020-09-09 DIAGNOSIS — L821 Other seborrheic keratosis: Secondary | ICD-10-CM

## 2020-09-09 NOTE — Progress Notes (Signed)
Follow-Up Visit   Subjective  Ann Young is a 80 y.o. female who presents for the following: milia  (Patient here today to have spot above left upper lip removed. She also states she has a rough spot on back of scalp she noticed and would like checked. ).  She picks at it. Patient reports area above lip is irritated and itches. She also has some brown spots she would like removed. The following portions of the chart were reviewed this encounter and updated as appropriate:      Objective  Well appearing patient in no apparent distress; mood and affect are within normal limits.  A focused examination was performed including posterior scalp, left chin, left upper lip, right lateral cheek. Relevant physical exam findings are noted in the Assessment and Plan.  Objective  left chin and left upper lip: Firm white papules   Objective  occipital hairline x 1: Erythematous keratotic or waxy stuck-on papule   Objective  Right cheek x 3 (3): Waxy tan patches  Images      Assessment & Plan  Epidermoid cyst of skin left chin and left upper lip  Milia, symptomatic- Irritated and itchy   Acne/Milia surgery - left chin and left upper lip Procedure risks and benefits were discussed with the patient and verbal consent was obtained. Following prep of the skin on the mid forehead with an alcohol swab, extraction of milia was performed with a comedone extractor following superficial incision made over their surfaces with a #15 surgical blade. Capillary hemostasis was achieved with 20% aluminum chloride solution. Vaseline ointment was applied to each site. The patient tolerated the procedure well.   Inflamed seborrheic keratosis occipital hairline x 1  Prior to procedure, discussed risks of blister formation, small wound, skin dyspigmentation, or rare scar following cryotherapy.    Destruction of lesion - occipital hairline x 1  Destruction method: cryotherapy   Informed  consent: discussed and consent obtained   Timeout:  patient name, date of birth, surgical site, and procedure verified Lesion destroyed using liquid nitrogen: Yes   Region frozen until ice ball extended beyond lesion: Yes   Cryotherapy cycles:  1 Outcome: patient tolerated procedure well with no complications   Post-procedure details: wound care instructions given    Seborrheic keratosis (3) Right cheek x 3  Discussed cosmetic procedure cryotherapy, noncovered.  $60 for 1st lesion and $15 for each additional lesion if done on the same day.  Maximum charge $350.  One touch-up treatment included no charge. Discussed risks of treatment including dyspigmentation, small scar, and/or recurrence. Recommend daily broad spectrum sunscreen SPF 30+/photoprotection to treated areas once healed.   Prior to procedure, discussed risks of blister formation, small wound, skin dyspigmentation, or rare scar following cryotherapy.    Sks x 3 treated R cheek, $90  Destruction of lesion - Right cheek x 3  Destruction method: cryotherapy   Informed consent: discussed and consent obtained   Timeout:  patient name, date of birth, surgical site, and procedure verified Lesion destroyed using liquid nitrogen: Yes   Region frozen until ice ball extended beyond lesion: Yes   Outcome: patient tolerated procedure well with no complications   Post-procedure details: wound care instructions given    Seborrheic Keratoses - Stuck-on, waxy, tan-brown papules and/or plaques   - Benign-appearing - Discussed benign etiology and prognosis. - Observe - Call for any changes  Actinic Damage - chronic, secondary to cumulative UV radiation exposure/sun exposure over time - diffuse scaly  erythematous macules with underlying dyspigmentation - Recommend daily broad spectrum sunscreen SPF 30+ to sun-exposed areas, reapply every 2 hours as needed.  - Recommend staying in the shade or wearing long sleeves, sun glasses (UVA+UVB  protection) and wide brim hats (4-inch brim around the entire circumference of the hat). - Call for new or changing lesions.  Return in about 2 months (around 11/09/2020), or if symptoms worsen or fail to improve, for SK follow up.   I, Ruthell Rummage, CMA, am acting as scribe for Brendolyn Patty, MD.  Documentation: I have reviewed the above documentation for accuracy and completeness, and I agree with the above.  Brendolyn Patty MD

## 2020-09-09 NOTE — Patient Instructions (Addendum)

## 2020-09-11 ENCOUNTER — Telehealth: Payer: Self-pay | Admitting: Family Medicine

## 2020-09-11 NOTE — Telephone Encounter (Signed)
[facility] faxed refill request for the following medications:  lovastatin (MEVACOR) 40 MG tablet  90 day supply  Last Rx: 06/11/20 30 day supply with 3 refills LOV: 03/18/20 Please advise. Thanks TNP

## 2020-09-12 MED ORDER — LOVASTATIN 40 MG PO TABS: 40.0000 mg | ORAL_TABLET | Freq: Every day | ORAL | 3 refills | Status: DC

## 2020-09-18 NOTE — Telephone Encounter (Signed)
Opened in error. KW °

## 2020-10-09 NOTE — Progress Notes (Signed)
I,April Miller,acting as a Education administrator for [employer], PA-C.,have documented all relevant documentation on the behalf of Vernie Murders, PA-C,as directed by  [employer], PA-C while in the presence of [employer], PA-C.   Established patient visit   Patient: Ann Young   DOB: 1941/06/10   80 y.o. Female  MRN: [id number] Visit Date: 10/10/2020  Today's healthcare provider: Vernie Murders, PA-C   Chief Complaint  Patient presents with  . Follow-up  . Hyperlipidemia   Subjective    HPI  Lipid/Cholesterol, Follow-up  Last lipid panel Other pertinent labs  Lab Results  Component Value Date   CHOL 191 03/14/2020   HDL 61 03/14/2020   LDLCALC 115 (H) 03/14/2020   TRIG 80 03/14/2020   CHOLHDL 3.1 03/14/2020   Lab Results  Component Value Date   ALT 15 11/20/2019   AST 20 11/20/2019   PLT 167 11/20/2019   TSH 3.940 11/20/2019     She was last seen for this 6 months ago.  Management since that visit includes continue Lovastatin 40mg  daily and add Metamucil.  She reports good compliance with treatment. She is not having side effects. none Current diet: well balanced Current exercise: walking the dog and house work  The 10-year ASCVD risk score Mikey Bussing DC Brooke Bonito., et al., 2013) is: 13.7%  --------------------------------------------------------------------   Patient Active Problem List   Diagnosis Date Noted  . Pelvic pain in female 06/06/2019  . Thickened endometrium 07/04/2017  . Ketonuria 09/03/2016  . Chronic reflux esophagitis 07/20/2016  . Multiple gastric polyps 07/20/2016  . History of allergy 08/18/2015  . Osteoporosis 05/07/2015  . Constipation, chronic 05/07/2015  . History of colorectal cancer 05/07/2015  . Depression with anxiety 05/07/2015  . Arthritis 05/07/2015  . Mixed hyperlipidemia 05/07/2015  . GERD with stricture 05/07/2015  . Low back pain 02/10/2015  . Problems influencing health status 10/28/2014  . Malignant neoplasm ([place])  10/28/2014  . History of colonic polyps 10/28/2014  . Anxiety and depression 10/28/2014  . Acid reflux 10/28/2014  . History of malignant neoplasm of large intestine 10/28/2014  . 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 Gastric ulcer 10/28/2014  . 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 Awareness of heartbeats 03/25/2014   Social History   Tobacco Use  . Smoking status: Never Smoker  . Smokeless tobacco: Never Used  Vaping Use  . Vaping Use: Never used  Substance Use Topics  . Alcohol use: No    Alcohol/week: 0.0 standard drinks  . Drug use: No   Allergies  Allergen Reactions  . Atorvastatin Other (See Comments)    Mayalgia Other reaction(s): Other (See Comments), Other (See Comments) Mayalgia Mayalgia  . Clarithromycin Other (See Comments)    Questionable Biaxin vs Flagyl with chest discomfort & irregular heartbeat (most likely thought to be related to the Biaxin) Other reaction(s): Other (See Comments) Questionable Biaxin vs Flagyl with chest discomfort & irregular heartbeat (most likely thought to be related to the Biaxin) Questionable Biaxin vs Flagyl with chest discomfort & irregular heartbeat (most likely thought to be related to the Biaxin)  . Fesoterodine     Other reaction(s): Other (See Comments) weakness Other reaction(s): Other (See Comments) Other reaction(s): Other (See Comments) weakness weakness  . Ibuprofen Other (See Comments)    To prevent swelling,increase hr/bp Other reaction(s): Other (See Comments) To prevent swelling,increase hr/b PATIENT STATES NOT ALLERGIC TO IBUPROFEN  . Lamotrigine Other (See Comments)    Hair thinning Hair thinning Other  reaction(s): Unknown Hair thinning Hair thinning   . Metronidazole Other (See Comments)    Questionable Biaxin vs Flagyl with chest discomfort & irregular heartbeat (most likely thought to be related to the Biaxin) Other reaction(s): Other (See  Comments), Other (See Comments) Questionable Biaxin vs Flagyl with chest discomfort & irregular heartbeat (most likely thought to be related to the Biaxin) Questionable Biaxin vs Flagyl with chest discomfort & irregular heartbeat (most likely thought to be related to the Biaxin)  . Other Other (See Comments)    decongestants  . Penicillins Nausea Only  . Simvastatin Other (See Comments)    Hari Loss Other reaction(s): Other (See Comments), Other (See Comments) Hari Loss Hari Loss  . Sulfa Antibiotics     Other reaction(s): Unknown Other reaction(s): Unknown Patient cant remember  . Toviaz  [Fesoterodine Fumarate Er] Other (See Comments)    weakness       Medications: Outpatient Medications Prior to Visit  Medication Sig  . Ascorbic Acid (VITAMIN C) 1000 MG tablet Take 1,000 mg by mouth daily.  Marland Kitchen aspirin 81 MG tablet Take 81 mg by mouth daily.  . busPIRone (BUSPAR) 10 MG tablet Take 1 tablet (10 mg total) by mouth daily as needed. (Patient taking differently: Take 5 mg by mouth daily as needed.)  . Calcium Carb-Cholecalciferol 600-200 MG-UNIT TABS Take 1 tablet by mouth 2 (two) times daily.  . cetirizine (ZYRTEC) 10 MG tablet TAKE 1 TABLET BY MOUTH ONCE DAILY FOR ALLERGIES  . Cholecalciferol (VITAMIN D PO) Take by mouth.  . docusate sodium (COLACE) 100 MG capsule Take by mouth 2 (two) times daily.  . fluticasone (FLONASE) 50 MCG/ACT nasal spray Place 1 spray into both nostrils daily.   . hydrocortisone 2.5 % ointment   . ketoconazole (NIZORAL) 2 % cream   . lovastatin (MEVACOR) 40 MG tablet Take 1 tablet (40 mg total) by mouth at bedtime.  . mirtazapine (REMERON) 30 MG tablet Take 1 tablet (30 mg total) by mouth at bedtime.  . Omega-3 Fatty Acids (FISH OIL PO) Take 1 capsule by mouth daily.   . pantoprazole (PROTONIX) 40 MG tablet Take 1 tablet by mouth once daily  . polyethylene glycol (MIRALAX / GLYCOLAX) 17 g packet Take 17 g by mouth daily.  Marland Kitchen triamcinolone lotion (KENALOG)  0.1 % Apply 1 application topically daily as needed.  Marland Kitchen alendronate (FOSAMAX) 70 MG tablet Take 1 tablet (70 mg total) by mouth once a week. Reported on 08/18/2015 (Patient not taking: No sig reported)  . ondansetron (ZOFRAN ODT) 4 MG disintegrating tablet Allow 1-2 tablets to dissolve in your mouth every 8 hours as needed for nausea/vomiting (Patient not taking: No sig reported)   No facility-administered medications prior to visit.    Review of Systems  Constitutional: Negative for appetite change, chills, fatigue and fever.  Respiratory: Negative for chest tightness and shortness of breath.   Cardiovascular: Negative for chest pain and palpitations.  Gastrointestinal: Negative for abdominal pain, nausea and vomiting.  Neurological: Negative for dizziness and weakness.        Objective    BP 104/67 (BP Location: Right Arm, Patient Position: Sitting, Cuff Size: Normal)   Pulse 66   Temp 97.9 F (36.6 C) (Oral)   Resp 18   Ht 5' (1.524 m)   Wt 133 lb (60.3 kg)   SpO2 96%   BMI 25.97 kg/m  BP Readings from Last 3 Encounters:  10/10/20 104/67  03/18/20 118/60  11/20/19 110/62   Wt Readings  from Last 3 Encounters:  10/10/20 133 lb (60.3 kg)  03/18/20 128 lb (58.1 kg)  11/20/19 128 lb (58.1 kg)      Physical Exam Constitutional:      General: She is not in acute distress.    Appearance: She is well-developed.  HENT:     Head: Normocephalic and atraumatic.     Right Ear: Hearing and tympanic membrane normal.     Left Ear: Hearing and tympanic membrane normal.     Nose: Nose normal.  Eyes:     General: Lids are normal. No scleral icterus.       Right eye: No discharge.        Left eye: No discharge.     Conjunctiva/sclera: Conjunctivae normal.  Cardiovascular:     Rate and Rhythm: Normal rate.  Pulmonary:     Effort: Pulmonary effort is normal. No respiratory distress.     Breath sounds: Normal breath sounds.  Abdominal:     General: Bowel sounds are normal.      Palpations: Abdomen is soft.  Musculoskeletal:        General: Normal range of motion.     Cervical back: Neck supple.  Skin:    Findings: No lesion or rash.  Neurological:     Mental Status: She is alert and oriented to person, place, and time.  Psychiatric:        Speech: Speech normal.        Behavior: Behavior normal.        Thought Content: Thought content normal.       No results found for any visits on 10/10/20.  Assessment & Plan     1. Mixed hyperlipidemia Tolerating the Lovastatin without myalgias like she had with the Atorvastatin. Unable to get Zetia due to insurance not covering it. Follow up labs and continue present diet with Lovastatin. - CBC with Differential/Platelet - Comprehensive metabolic panel - Lipid panel - TSH  2. Depression with anxiety Stable mood without panic or sense of helplessness. Tolerating the Remeron well. Continue present dosage. - CBC with Differential/Platelet - Comprehensive metabolic panel - TSH - mirtazapine (REMERON) 30 MG tablet; Take 1 tablet (30 mg total) by mouth at bedtime.  Dispense: 30 tablet; Refill: 3  3. Seasonal allergic rhinitis due to pollen Watery eyes and rhinorrhea in the mornings. Occasionally uses Flonase and takes Zyrtec daily. Refill med. - CBC with Differential/Platelet - cetirizine (ZYRTEC) 10 MG tablet; TAKE 1 TABLET BY MOUTH ONCE DAILY FOR ALLERGIES  Dispense: 90 tablet; Refill: 3   No follow-ups on file.      I, Renlee Floor, PA-C, have reviewed all documentation for this visit. The documentation on 10/10/20 for the exam, diagnosis, procedures, and orders are all accurate and complete.    Vernie Murders, PA-C  [employer] [phone] (phone) [phone] (fax)  [place]

## 2020-10-09 NOTE — Patient Instructions (Signed)
High Cholesterol  High cholesterol is a condition in which the blood has high levels of a white, waxy substance similar to fat (cholesterol). The liver makes all the cholesterol that the body needs. The human body needs small amounts of cholesterol to help build cells. A person gets extra or excess cholesterol from the food that he or she eats. The blood carries cholesterol from the liver to the rest of the body. If you have high cholesterol, deposits (plaques) may build up on the walls of your arteries. Arteries are the blood vessels that carry blood away from your heart. These plaques make the arteries narrow and stiff. Cholesterol plaques increase your risk for heart attack and stroke. Work with your health care provider to keep your cholesterol levels in a healthy range. What increases the risk? The following factors may make you more likely to develop this condition:  Eating foods that are high in animal fat (saturated fat) or cholesterol.  Being overweight.  Not getting enough exercise.  A family history of high cholesterol (familial hypercholesterolemia).  Use of tobacco products.  Having diabetes. What are the signs or symptoms? There are no symptoms of this condition. How is this diagnosed? This condition may be diagnosed based on the results of a blood test.  If you are older than 78 years of age, your health care provider may check your cholesterol levels every 4-6 years.  You may be checked more often if you have high cholesterol or other risk factors for heart disease. The blood test for cholesterol measures:  "Bad" cholesterol, or LDL cholesterol. This is the main type of cholesterol that causes heart disease. The desired level is less than 100 mg/dL.  "Good" cholesterol, or HDL cholesterol. HDL helps protect against heart disease by cleaning the arteries and carrying the LDL to the liver for processing. The desired level for HDL is 60 mg/dL or higher.  Triglycerides.  These are fats that your body can store or burn for energy. The desired level is less than 150 mg/dL.  Total cholesterol. This measures the total amount of cholesterol in your blood and includes LDL, HDL, and triglycerides. The desired level is less than 200 mg/dL. How is this treated? This condition may be treated with:  Diet changes. You may be asked to eat foods that have more fiber and less saturated fats or added sugar.  Lifestyle changes. These may include regular exercise, maintaining a healthy weight, and quitting use of tobacco products.  Medicines. These are given when diet and lifestyle changes have not worked. You may be prescribed a statin medicine to help lower your cholesterol levels. Follow these instructions at home: Eating and drinking  Eat a healthy, balanced diet. This diet includes: ? Daily servings of a variety of fresh, frozen, or canned fruits and vegetables. ? Daily servings of whole grain foods that are rich in fiber. ? Foods that are low in saturated fats and trans fats. These include poultry and fish without skin, lean cuts of meat, and low-fat dairy products. ? A variety of fish, especially oily fish that contain omega-3 fatty acids. Aim to eat fish at least 2 times a week.  Avoid foods and drinks that have added sugar.  Use healthy cooking methods, such as roasting, grilling, broiling, baking, poaching, steaming, and stir-frying. Do not fry your food except for stir-frying.   Lifestyle  Get regular exercise. Aim to exercise for a total of 150 minutes a week. Increase your activity level by doing activities   such as gardening, walking, and taking the stairs.  Do not use any products that contain nicotine or tobacco, such as cigarettes, e-cigarettes, and chewing tobacco. If you need help quitting, ask your health care provider.   General instructions  Take over-the-counter and prescription medicines only as told by your health care provider.  Keep all  follow-up visits as told by your health care provider. This is important. Where to find more information  American Heart Association: www.heart.org  National Heart, Lung, and Blood Institute: www.nhlbi.nih.gov Contact a health care provider if:  You have trouble achieving or maintaining a healthy diet or weight.  You are starting an exercise program.  You are unable to stop smoking. Get help right away if:  You have chest pain.  You have trouble breathing.  You have any symptoms of a stroke. "BE FAST" is an easy way to remember the main warning signs of a stroke: ? B - Balance. Signs are dizziness, sudden trouble walking, or loss of balance. ? E - Eyes. Signs are trouble seeing or a sudden change in vision. ? F - Face. Signs are sudden weakness or numbness of the face, or the face or eyelid drooping on one side. ? A - Arms. Signs are weakness or numbness in an arm. This happens suddenly and usually on one side of the body. ? S - Speech. Signs are sudden trouble speaking, slurred speech, or trouble understanding what people say. ? T - Time. Time to call emergency services. Write down what time symptoms started.  You have other signs of a stroke, such as: ? A sudden, severe headache with no known cause. ? Nausea or vomiting. ? Seizure. These symptoms may represent a serious problem that is an emergency. Do not wait to see if the symptoms will go away. Get medical help right away. Call your local emergency services (911 in the [place] Do not drive yourself to the hospital. Summary  Cholesterol plaques increase your risk for heart attack and stroke. Work with your health care provider to keep your cholesterol levels in a healthy range.  Eat a healthy, balanced diet, get regular exercise, and maintain a healthy weight.  Do not use any products that contain nicotine or tobacco, such as cigarettes, e-cigarettes, and chewing tobacco.  Get help right away if you have any symptoms of a  stroke. This information is not intended to replace advice given to you by your health care provider. Make sure you discuss any questions you have with your health care provider. Document Revised: 04/30/2019 Document Reviewed: 04/30/2019 Elsevier Patient Education  2021 Elsevier Inc.  

## 2020-10-10 ENCOUNTER — Other Ambulatory Visit: Payer: Self-pay

## 2020-10-10 ENCOUNTER — Encounter: Payer: Self-pay | Admitting: Family Medicine

## 2020-10-10 ENCOUNTER — Ambulatory Visit (INDEPENDENT_AMBULATORY_CARE_PROVIDER_SITE_OTHER): Payer: Medicare Other | Admitting: Family Medicine

## 2020-10-10 VITALS — BP 104/67 | HR 66 | Temp 97.9°F | Resp 18 | Ht 60.0 in | Wt 133.0 lb

## 2020-10-10 DIAGNOSIS — E782 Mixed hyperlipidemia: Secondary | ICD-10-CM | POA: Diagnosis not present

## 2020-10-10 DIAGNOSIS — J301 Allergic rhinitis due to pollen: Secondary | ICD-10-CM | POA: Diagnosis not present

## 2020-10-10 DIAGNOSIS — F418 Other specified anxiety disorders: Secondary | ICD-10-CM | POA: Diagnosis not present

## 2020-10-10 MED ORDER — MIRTAZAPINE 30 MG PO TABS: 30.0000 mg | ORAL_TABLET | Freq: Every day | ORAL | 3 refills | Status: DC

## 2020-10-10 MED ORDER — CETIRIZINE HCL 10 MG PO TABS: ORAL_TABLET | ORAL | 3 refills | Status: DC

## 2020-10-11 LAB — CBC WITH DIFFERENTIAL/PLATELET
Basophils Absolute: 0 10*3/uL (ref 0.0–0.2)
Basos: 1 %
EOS (ABSOLUTE): 0.2 10*3/uL (ref 0.0–0.4)
Eos: 4 %
Hematocrit: 38.5 % (ref 34.0–46.6)
Hemoglobin: 12.4 g/dL (ref 11.1–15.9)
Immature Grans (Abs): 0 10*3/uL (ref 0.0–0.1)
Immature Granulocytes: 0 %
Lymphocytes Absolute: 1.6 10*3/uL (ref 0.7–3.1)
Lymphs: 35 %
MCH: 30.6 pg (ref 26.6–33.0)
MCHC: 32.2 g/dL (ref 31.5–35.7)
MCV: 95 fL (ref 79–97)
Monocytes Absolute: 0.4 10*3/uL (ref 0.1–0.9)
Monocytes: 9 %
Neutrophils Absolute: 2.4 10*3/uL (ref 1.4–7.0)
Neutrophils: 51 %
Platelets: 154 10*3/uL (ref 150–450)
RBC: 4.05 x10E6/uL (ref 3.77–5.28)
RDW: 11.6 % — ABNORMAL LOW (ref 11.7–15.4)
WBC: 4.7 10*3/uL (ref 3.4–10.8)

## 2020-10-11 LAB — COMPREHENSIVE METABOLIC PANEL
ALT: 17 IU/L (ref 0–32)
AST: 23 IU/L (ref 0–40)
Albumin/Globulin Ratio: 2.5 — ABNORMAL HIGH (ref 1.2–2.2)
Albumin: 4.8 g/dL — ABNORMAL HIGH (ref 3.7–4.7)
Alkaline Phosphatase: 62 IU/L (ref 44–121)
BUN/Creatinine Ratio: 24 (ref 12–28)
BUN: 21 mg/dL (ref 8–27)
Bilirubin Total: 0.2 mg/dL (ref 0.0–1.2)
CO2: 27 mmol/L (ref 20–29)
Calcium: 9.8 mg/dL (ref 8.7–10.3)
Chloride: 105 mmol/L (ref 96–106)
Creatinine, Ser: 0.87 mg/dL (ref 0.57–1.00)
Globulin, Total: 1.9 g/dL (ref 1.5–4.5)
Glucose: 91 mg/dL (ref 65–99)
Potassium: 4 mmol/L (ref 3.5–5.2)
Sodium: 145 mmol/L — ABNORMAL HIGH (ref 134–144)
Total Protein: 6.7 g/dL (ref 6.0–8.5)
eGFR: 69 mL/min/{1.73_m2} (ref >59)

## 2020-10-11 LAB — LIPID PANEL
Chol/HDL Ratio: 3 ratio (ref 0.0–4.4)
Cholesterol, Total: 201 mg/dL — ABNORMAL HIGH (ref 100–199)
HDL: 66 mg/dL (ref >39)
LDL Chol Calc (NIH): 122 mg/dL — ABNORMAL HIGH (ref 0–99)
Triglycerides: 69 mg/dL (ref 0–149)
VLDL Cholesterol Cal: 13 mg/dL (ref 5–40)

## 2020-10-11 LAB — TSH: TSH: 4.79 u[IU]/mL — ABNORMAL HIGH (ref 0.450–4.500)

## 2020-10-13 ENCOUNTER — Ambulatory Visit: Payer: Medicare Other | Admitting: Family Medicine

## 2020-10-20 ENCOUNTER — Telehealth: Payer: Self-pay | Admitting: Family Medicine

## 2020-10-20 ENCOUNTER — Other Ambulatory Visit: Payer: Self-pay | Admitting: Family Medicine

## 2020-10-20 MED ORDER — ROSUVASTATIN CALCIUM 10 MG PO TABS: 10.0000 mg | ORAL_TABLET | Freq: Every day | ORAL | 1 refills | Status: DC

## 2020-10-20 NOTE — Telephone Encounter (Signed)
Patient is calling to check the status of her cholesterol medication which was changed by the doctor.  She stated that her pharmacy does not have an order for the medication.  Please advise and call patient to discuss at [phone]

## 2020-10-21 ENCOUNTER — Other Ambulatory Visit: Payer: Self-pay | Admitting: Family Medicine

## 2020-10-21 DIAGNOSIS — Z1231 Encounter for screening mammogram for malignant neoplasm of breast: Secondary | ICD-10-CM

## 2020-10-28 ENCOUNTER — Ambulatory Visit
Admission: RE | Admit: 2020-10-28 | Discharge: 2020-10-28 | Disposition: A | Discharge status: Home / Self Care | Payer: Medicare Other | Source: Ambulatory Visit | Attending: Family Medicine | Admitting: Family Medicine

## 2020-10-28 ENCOUNTER — Other Ambulatory Visit: Payer: Self-pay

## 2020-10-28 DIAGNOSIS — Z1231 Encounter for screening mammogram for malignant neoplasm of breast: Secondary | ICD-10-CM | POA: Insufficient documentation

## 2020-11-11 ENCOUNTER — Ambulatory Visit: Payer: PRIVATE HEALTH INSURANCE | Admitting: Dermatology

## 2020-11-26 ENCOUNTER — Other Ambulatory Visit: Payer: Self-pay

## 2020-11-26 ENCOUNTER — Ambulatory Visit (INDEPENDENT_AMBULATORY_CARE_PROVIDER_SITE_OTHER): Payer: Medicare Other | Admitting: Dermatology

## 2020-11-26 DIAGNOSIS — L820 Inflamed seborrheic keratosis: Secondary | ICD-10-CM

## 2020-11-26 DIAGNOSIS — L821 Other seborrheic keratosis: Secondary | ICD-10-CM

## 2020-11-26 DIAGNOSIS — L814 Other melanin hyperpigmentation: Secondary | ICD-10-CM | POA: Diagnosis not present

## 2020-11-26 NOTE — Patient Instructions (Signed)

## 2020-11-26 NOTE — Progress Notes (Signed)
   Follow-Up Visit   Subjective  Ann Young is a 80 y.o. female who presents for the following: Follow-up (Patient here for 2 month follow-up cosmetic SKs treated on the right cheek. One area may be coming back a little. She also has a scab in her left ear x several months. Inflamed SK treated on the occipital hairline last visit didn't clear. ).    The following portions of the chart were reviewed this encounter and updated as appropriate:        Review of Systems:  No other skin or systemic complaints except as noted in HPI or Assessment and Plan.  Objective  Well appearing patient in no apparent distress; mood and affect are within normal limits.  A focused examination was performed including face, scalp. Relevant physical exam findings are noted in the Assessment and Plan.  Occipital Scalp x 1, L antihelix x 1 (2) Small erythematous waxy stuck-on papule of the occipital scalp; excoriated papule of the left antihelix.  Right Cheek x 3 (3) Small residual stuck-on, waxy, tan-brown papules.  Discussed benign etiology and prognosis.    Assessment & Plan  Inflamed seborrheic keratosis Occipital Scalp x 1, L antihelix x 1  Residual   Destruction of lesion - Occipital Scalp x 1, L antihelix x 1  Destruction method: cryotherapy   Informed consent: discussed and consent obtained   Lesion destroyed using liquid nitrogen: Yes   Region frozen until ice ball extended beyond lesion: Yes   Outcome: patient tolerated procedure well with no complications   Post-procedure details: wound care instructions given   Additional details:  Prior to procedure, discussed risks of blister formation, small wound, skin dyspigmentation, or rare scar following cryotherapy. Recommend Vaseline ointment to treated areas while healing.   Seborrheic keratosis (3) Right Cheek x 3  Residual.  Cosmetic touch-up today x 3, no charge.     Destruction of lesion - Right Cheek x 3  Destruction  method: cryotherapy   Informed consent: discussed and consent obtained   Lesion destroyed using liquid nitrogen: Yes   Region frozen until ice ball extended beyond lesion: Yes   Outcome: patient tolerated procedure well with no complications   Post-procedure details: wound care instructions given   Additional details:  Prior to procedure, discussed risks of blister formation, small wound, skin dyspigmentation, or rare scar following cryotherapy. Recommend Vaseline ointment to treated areas while healing.   Lentigines - Scattered tan macules face - Due to sun exposure - Benign-appering, observe - Recommend daily broad spectrum sunscreen SPF 30+ to sun-exposed areas, reapply every 2 hours as needed. - Call for any changes   Return in about 6 months (around 05/28/2021) for TBSE.  IJamesetta Orleans, CMA, am acting as scribe for Brendolyn Patty, MD . Documentation: I have reviewed the above documentation for accuracy and completeness, and I agree with the above.  Brendolyn Patty MD

## 2020-12-08 ENCOUNTER — Other Ambulatory Visit: Payer: Self-pay

## 2020-12-08 DIAGNOSIS — F418 Other specified anxiety disorders: Secondary | ICD-10-CM

## 2020-12-08 MED ORDER — MIRTAZAPINE 30 MG PO TABS: 30.0000 mg | ORAL_TABLET | Freq: Every day | ORAL | 3 refills | Status: DC

## 2020-12-08 NOTE — Telephone Encounter (Signed)
Patient would like 3 month supply at a time    Copied from [place] [id number]. Topic: General - Other >> Dec 08, 2020  3:19 PM Lenon Curt, Everette A wrote: Reason for CRM: Patient has an upcoming refill of their medication due   Patient would like to be prescribed a three month supply of their mirtazapine (REMERON) 30 MG tablet to save money as well as time going to the pharmacy   Please contact to further advise

## 2021-01-01 ENCOUNTER — Other Ambulatory Visit: Payer: Self-pay | Admitting: Family Medicine

## 2021-01-01 DIAGNOSIS — F419 Anxiety disorder, unspecified: Secondary | ICD-10-CM

## 2021-01-01 DIAGNOSIS — F32A Depression, unspecified: Secondary | ICD-10-CM

## 2021-03-26 ENCOUNTER — Telehealth: Payer: Self-pay

## 2021-03-26 ENCOUNTER — Ambulatory Visit: Payer: Self-pay | Admitting: *Deleted

## 2021-03-26 NOTE — Telephone Encounter (Signed)
Pt reports urinary frequency at night x 2 months. States presently resolved but "Feel like I have to strain to empty bladder " States feels mild pressure at bladder area. Denies dysuria, no back pain, afebrile. States urine is not discolored "But looks like tiny white spots in commode sometimes." Not red, denies hematuria. Pt also reports runny nose, onset  last Friday. Clear, "Little stuffy."  States did 2 covid home tests, last one Monday, negative.  NT called practice, [facility], for consult. Pt scheduled for Monday. Pt aware.  Advised UC/ED for worsening symptoms. Pt verbalizes understanding.

## 2021-03-26 NOTE — Telephone Encounter (Signed)
Copied from [place] [id number]. Topic: Appointment Scheduling - Scheduling Inquiry for Clinic >> Mar 26, 2021  9:17 AM Ann Young wrote: Reason for CRM: Pt wants to get an appt for a possible kidney infection/ she stated she has been urinating frequently and lower abdominal pressure for a month

## 2021-03-26 NOTE — Telephone Encounter (Signed)
Paitent has scheduled appt with Ria Comment, see previous telephone encounter. KW

## 2021-03-26 NOTE — Telephone Encounter (Signed)
Reason for Disposition . Urination is difficult to start (i.e., hesitancy) or straining  Answer Assessment - Initial Assessment Questions 1. SYMPTOM: "What's the main symptom you're concerned about?" (e.g., frequency, incontinence)     Bladder pressure 2. ONSET: "When did the  *No Answer*  start?"     2 months ago 3. PAIN: "Is there any pain?" If Yes, ask: "How bad is it?" (Scale: 1-10; mild, moderate, severe)     no 4. CAUSE: "What do you think is causing the symptoms?"     Unsure 5. OTHER SYMPTOMS: "Do you have any other symptoms?" (e.g., fever, flank pain, blood in urine, pain with urination)     "Maybe tiny spots in commode after urinating"  not red, not blood  Protocols used: Urinary Symptoms-A-AH

## 2021-03-30 ENCOUNTER — Encounter: Payer: Self-pay | Admitting: Physician Assistant

## 2021-03-30 ENCOUNTER — Other Ambulatory Visit: Payer: Self-pay

## 2021-03-30 ENCOUNTER — Ambulatory Visit (INDEPENDENT_AMBULATORY_CARE_PROVIDER_SITE_OTHER): Payer: Medicare Other | Admitting: Physician Assistant

## 2021-03-30 VITALS — BP 126/67 | HR 70 | Temp 97.3°F | Resp 15 | Wt 129.4 lb

## 2021-03-30 DIAGNOSIS — Z23 Encounter for immunization: Secondary | ICD-10-CM | POA: Diagnosis not present

## 2021-03-30 DIAGNOSIS — K2100 Gastro-esophageal reflux disease with esophagitis, without bleeding: Secondary | ICD-10-CM | POA: Diagnosis not present

## 2021-03-30 DIAGNOSIS — J301 Allergic rhinitis due to pollen: Secondary | ICD-10-CM

## 2021-03-30 DIAGNOSIS — F3341 Major depressive disorder, recurrent, in partial remission: Secondary | ICD-10-CM | POA: Diagnosis not present

## 2021-03-30 DIAGNOSIS — E038 Other specified hypothyroidism: Secondary | ICD-10-CM | POA: Insufficient documentation

## 2021-03-30 DIAGNOSIS — E7849 Other hyperlipidemia: Secondary | ICD-10-CM

## 2021-03-30 DIAGNOSIS — N3000 Acute cystitis without hematuria: Secondary | ICD-10-CM

## 2021-03-30 LAB — POCT URINALYSIS DIPSTICK
Bilirubin, UA: NEGATIVE
Blood, UA: NEGATIVE
Glucose, UA: NEGATIVE
Ketones, UA: NEGATIVE
Nitrite, UA: NEGATIVE
Protein, UA: NEGATIVE
Spec Grav, UA: <=1.005 — AB (ref 1.010–1.025)
Urobilinogen, UA: 0.2 E.U./dL
pH, UA: 5 (ref 5.0–8.0)

## 2021-03-30 MED ORDER — CETIRIZINE HCL 10 MG PO TABS
ORAL_TABLET | ORAL | 3 refills | Status: DC
Start: 2021-03-30 — End: 2021-03-31

## 2021-03-30 MED ORDER — FLUTICASONE PROPIONATE 50 MCG/ACT NA SUSP: 1.0000 | Freq: Every day | NASAL | 3 refills | Status: DC

## 2021-03-30 MED ORDER — PANTOPRAZOLE SODIUM 40 MG PO TBEC: 40.0000 mg | DELAYED_RELEASE_TABLET | Freq: Every day | ORAL | 2 refills | Status: DC

## 2021-03-30 MED ORDER — BUSPIRONE HCL 10 MG PO TABS: 10.0000 mg | ORAL_TABLET | Freq: Every day | ORAL | 0 refills | Status: DC | PRN

## 2021-03-30 MED ORDER — ROSUVASTATIN CALCIUM 10 MG PO TABS: 10.0000 mg | ORAL_TABLET | Freq: Every day | ORAL | 1 refills | Status: DC

## 2021-03-30 MED ORDER — NITROFURANTOIN MONOHYD MACRO 100 MG PO CAPS: 100.0000 mg | ORAL_CAPSULE | Freq: Two times a day (BID) | ORAL | 0 refills | Status: DC

## 2021-03-30 NOTE — Assessment & Plan Note (Signed)
Feels stable on buspar, refilled. Advised taper of Remeron, and to eventually d/c to see how she feels. If she has difficult sleeping with no bedtime medication we can try Trazodone.

## 2021-03-30 NOTE — Assessment & Plan Note (Signed)
Pt self d/c crestor 2-3 mo ago and restarted last week. Will recheck lipids, cmp in 3 mo

## 2021-03-30 NOTE — Assessment & Plan Note (Signed)
4/22 TSH was elevated, will recheck TSH w/ reflex T4, especially w/ new complaint of hair loss

## 2021-03-30 NOTE — Progress Notes (Signed)
Established patient visit   Patient: Ann Young   DOB: 1941-02-10   80 y.o. Female  MRN: [id number] Visit Date: 03/30/2021  Today's healthcare provider: Mikey Kirschner, PA-C   Chief Complaint  Patient presents with   Urinary Retention    Patient reports that for the past two months she has had difficulty emptying bladder and states at times when she sneezes she leaks. Patient states that over the past two months she has noticed frequency of urination at night. Patient denies dysuria, flank pain, fever, nausea, vomiting, sweats or hematuria. Reports urinating every 30 minutes at night. She currently uses a pessary without issue, cleans every month.   Sinus Problem   Subjective     HPI     Urinary Retention    Additional comments: Patient reports that for the past two months she has had difficulty emptying bladder and states at times when she sneezes she leaks. Patient states that over the past two months she has noticed frequency of urination at night. Patient denies dysuria, flank pain, fever, nausea, vomiting, sweats or hematuria. Reports urinating every 30 minutes at night. She currently uses a pessary without issue, cleans every month.      Last edited by Mikey Kirschner, PA-C on 03/30/2021 11:35 AM.    Sinus Congestion Pt reports increased nasal congestion x 7 days, feels more sinus pressure on the right side. Denies HA, fevers, chills, SOB, cough. Reports improvement over the past few days.   Hair loss She also reports increased hair loss over the past few months. She stopped her Crestor as she though this was causing the hair loss, but as the hair loss did not improve, she restarted Crestor a week ago.   Depression She reports overall balanced mood, does not feel depressed currently. She dislikes Remeron as it makes her very hungry and she feels she has put on weight. Feels no difference in sleep quality on or off Remeron.   Medications: Outpatient Medications  Prior to Visit  Medication Sig   Ascorbic Acid (VITAMIN C) 1000 MG tablet Take 1,000 mg by mouth daily.   aspirin 81 MG tablet Take 81 mg by mouth daily.   Calcium Carb-Cholecalciferol 600-200 MG-UNIT TABS Take 1 tablet by mouth 2 (two) times daily.   Cholecalciferol (VITAMIN D PO) Take by mouth.   docusate sodium (COLACE) 100 MG capsule Take by mouth 2 (two) times daily.   hydrocortisone 2.5 % ointment    ketoconazole (NIZORAL) 2 % cream    Omega-3 Fatty Acids (FISH OIL PO) Take 1 capsule by mouth daily.    polyethylene glycol (MIRALAX / GLYCOLAX) 17 g packet Take 17 g by mouth daily.   triamcinolone lotion (KENALOG) 0.1 % Apply 1 application topically daily as needed.   [DISCONTINUED] busPIRone (BUSPAR) 10 MG tablet TAKE 1 TABLET BY MOUTH ONCE DAILY AS NEEDED   [DISCONTINUED] cetirizine (ZYRTEC) 10 MG tablet TAKE 1 TABLET BY MOUTH ONCE DAILY FOR ALLERGIES   [DISCONTINUED] fluticasone (FLONASE) 50 MCG/ACT nasal spray Place 1 spray into both nostrils daily.    [DISCONTINUED] mirtazapine (REMERON) 30 MG tablet Take 1 tablet (30 mg total) by mouth at bedtime.   [DISCONTINUED] pantoprazole (PROTONIX) 40 MG tablet Take 1 tablet by mouth once daily   [DISCONTINUED] rosuvastatin (CRESTOR) 10 MG tablet Take 1 tablet (10 mg total) by mouth daily.   [DISCONTINUED] alendronate (FOSAMAX) 70 MG tablet Take 1 tablet (70 mg total) by mouth once a week. Reported on 08/18/2015 (  Patient not taking: No sig reported)   [DISCONTINUED] ondansetron (ZOFRAN ODT) 4 MG disintegrating tablet Allow 1-2 tablets to dissolve in your mouth every 8 hours as needed for nausea/vomiting (Patient not taking: No sig reported)   No facility-administered medications prior to visit.    Review of Systems  Constitutional:  Positive for fatigue. Negative for chills and diaphoresis.  HENT:  Positive for congestion, sinus pressure (right side) and sneezing. Negative for ear pain (patient reports ear popping on right side) and sore  throat.   Respiratory:  Negative for cough and shortness of breath.   Gastrointestinal:  Positive for nausea.  Musculoskeletal:  Negative for neck pain.  Neurological:  Negative for headaches.      Objective    BP 126/67   Pulse 70   Temp (!) 97.3 F (36.3 C) (Oral)   Resp 15   Wt 129 lb 6.4 oz (58.7 kg)   BMI 25.27 kg/m    Physical Exam Constitutional:      Appearance: Normal appearance.  HENT:     Head: Normocephalic.     Nose: Congestion present.  Cardiovascular:     Rate and Rhythm: Normal rate and regular rhythm.     Heart sounds: Normal heart sounds.  Pulmonary:     Effort: Pulmonary effort is normal.     Breath sounds: Normal breath sounds.  Abdominal:     Palpations: Abdomen is soft.     Tenderness: There is no abdominal tenderness.  Neurological:     Mental Status: She is alert.     Results for orders placed or performed in visit on 03/30/21  POCT urinalysis dipstick  Result Value Ref Range   Color, UA yellow    Clarity, UA clear    Glucose, UA Negative Negative   Bilirubin, UA negative    Ketones, UA negative    Spec Grav, UA <=1.005 (A) 1.010 - 1.025   Blood, UA negative    pH, UA 5.0 5.0 - 8.0   Protein, UA Negative Negative   Urobilinogen, UA 0.2 0.2 or 1.0 E.U./dL   Nitrite, UA negative    Leukocytes, UA Large (3+) (A) Negative   Appearance     Odor      Assessment & Plan    Acute cystitis UA + leuk, Urine culture ordered Rx macrobid 100 mg bid x 5 days  Advised to f/u if urinary frequency is not improved, may have chronic bladder/pelvic floor issues and need ref back to gyn or preferably urology.   Sinus congestion Advised flonase once daily x 2 weeks straight to see if this brings any improvement. Continue Zyrtec as needed.  Other hyperlipidemia Pt self d/c crestor 2-3 mo ago and restarted last week. Will recheck lipids, cmp in 3 mo  Other specified hypothyroidism 4/22 TSH was elevated, will recheck TSH w/ reflex T4, especially w/  new complaint of hair loss  MDD (major depressive disorder), recurrent, in partial remission ([place]) Feels stable on buspar, refilled. Advised taper of Remeron, and to eventually d/c to see how she feels. If she has difficult sleeping with no bedtime medication we can try Trazodone.        I, Mikey Kirschner, PA-C have reviewed all documentation for this visit. The documentation on 03/30/2021 for the exam, diagnosis, procedures, and orders are all accurate and complete.    Mikey Kirschner, PA-C  [facility] [phone] (phone) [phone] (fax)  [place]

## 2021-03-31 ENCOUNTER — Other Ambulatory Visit: Payer: Self-pay | Admitting: Physician Assistant

## 2021-03-31 DIAGNOSIS — N3000 Acute cystitis without hematuria: Secondary | ICD-10-CM

## 2021-03-31 DIAGNOSIS — K2100 Gastro-esophageal reflux disease with esophagitis, without bleeding: Secondary | ICD-10-CM

## 2021-03-31 DIAGNOSIS — J301 Allergic rhinitis due to pollen: Secondary | ICD-10-CM

## 2021-03-31 DIAGNOSIS — F3341 Major depressive disorder, recurrent, in partial remission: Secondary | ICD-10-CM

## 2021-03-31 DIAGNOSIS — E7849 Other hyperlipidemia: Secondary | ICD-10-CM

## 2021-03-31 LAB — TSH+FREE T4
Free T4: 1.11 ng/dL (ref 0.82–1.77)
TSH: 2.17 u[IU]/mL (ref 0.450–4.500)

## 2021-03-31 MED ORDER — CETIRIZINE HCL 10 MG PO TABS: ORAL_TABLET | ORAL | 3 refills | Status: DC

## 2021-03-31 MED ORDER — NITROFURANTOIN MONOHYD MACRO 100 MG PO CAPS: 100.0000 mg | ORAL_CAPSULE | Freq: Two times a day (BID) | ORAL | 0 refills | Status: DC

## 2021-03-31 MED ORDER — PANTOPRAZOLE SODIUM 40 MG PO TBEC: 40.0000 mg | DELAYED_RELEASE_TABLET | Freq: Every day | ORAL | 2 refills | Status: AC

## 2021-03-31 MED ORDER — ROSUVASTATIN CALCIUM 10 MG PO TABS: 10.0000 mg | ORAL_TABLET | Freq: Every day | ORAL | 1 refills | Status: DC

## 2021-03-31 MED ORDER — BUSPIRONE HCL 10 MG PO TABS: 10.0000 mg | ORAL_TABLET | Freq: Every day | ORAL | 0 refills | Status: AC | PRN

## 2021-03-31 MED ORDER — FLUTICASONE PROPIONATE 50 MCG/ACT NA SUSP: 1.0000 | Freq: Every day | NASAL | 0 refills | Status: DC

## 2021-03-31 NOTE — Progress Notes (Signed)
Re sent rx due to e prescribing issue

## 2021-04-01 ENCOUNTER — Other Ambulatory Visit: Payer: Self-pay | Admitting: Physician Assistant

## 2021-04-01 DIAGNOSIS — T3695XA Adverse effect of unspecified systemic antibiotic, initial encounter: Secondary | ICD-10-CM

## 2021-04-01 DIAGNOSIS — N3000 Acute cystitis without hematuria: Secondary | ICD-10-CM

## 2021-04-01 DIAGNOSIS — B379 Candidiasis, unspecified: Secondary | ICD-10-CM

## 2021-04-01 LAB — URINE CULTURE

## 2021-04-01 MED ORDER — FLUCONAZOLE 150 MG PO TABS: 150.0000 mg | ORAL_TABLET | Freq: Once | ORAL | 0 refills | Status: AC

## 2021-04-01 MED ORDER — PENICILLIN V POTASSIUM 500 MG PO TABS: 500.0000 mg | ORAL_TABLET | Freq: Three times a day (TID) | ORAL | 0 refills | Status: DC

## 2021-04-01 NOTE — Progress Notes (Signed)
Was present when Ann Young spoke to pt about PCN allergy--not a true allergy she will take with food/ probiotic Often gets yeast infections from PCN, will rx Diflucan, she can take if symptoms start.

## 2021-04-15 ENCOUNTER — Other Ambulatory Visit: Payer: Self-pay

## 2021-04-15 ENCOUNTER — Ambulatory Visit (INDEPENDENT_AMBULATORY_CARE_PROVIDER_SITE_OTHER): Payer: Medicare Other | Admitting: Physician Assistant

## 2021-04-15 ENCOUNTER — Encounter: Payer: Self-pay | Admitting: Physician Assistant

## 2021-04-15 VITALS — BP 123/64 | HR 72 | Temp 97.9°F | Wt 129.4 lb

## 2021-04-15 DIAGNOSIS — N3000 Acute cystitis without hematuria: Secondary | ICD-10-CM | POA: Diagnosis not present

## 2021-04-15 LAB — POCT URINALYSIS DIPSTICK
Bilirubin, UA: NEGATIVE
Glucose, UA: NEGATIVE
Ketones, UA: NEGATIVE
Nitrite, UA: NEGATIVE
Protein, UA: NEGATIVE
Spec Grav, UA: 1.015 (ref 1.010–1.025)
Urobilinogen, UA: 0.2 E.U./dL
pH, UA: 5 (ref 5.0–8.0)

## 2021-04-15 NOTE — Progress Notes (Signed)
Established patient visit   Patient: Ann Young   DOB: 1940/10/08   80 y.o. Female  MRN: [id number] Visit Date: 04/15/2021  Today's healthcare provider: Mikey Kirschner, PA-C   Chief Complaint  Patient presents with   Urinary Tract Infection   Subjective    HPI  Shade is a 80 year old female who presents today for continued UTI symptoms.  Reports consistent urinary frequency and increased difficulty fully voiding.  She removed her pessary to see if this improved anything, no change.  Denies any dysuria, fever, body aches, chills.  Original culture grew beta moderate strep, original antibiotic of Macrobid was changed to penicillin.  She has completed her course.     Medications: Outpatient Medications Prior to Visit  Medication Sig   Ascorbic Acid (VITAMIN C) 1000 MG tablet Take 1,000 mg by mouth daily.   aspirin 81 MG tablet Take 81 mg by mouth daily.   busPIRone (BUSPAR) 10 MG tablet Take 1 tablet (10 mg total) by mouth daily as needed.   Calcium Carb-Cholecalciferol 600-200 MG-UNIT TABS Take 1 tablet by mouth 2 (two) times daily.   cetirizine (ZYRTEC) 10 MG tablet TAKE 1 TABLET BY MOUTH ONCE DAILY FOR ALLERGIES   Cholecalciferol (VITAMIN D PO) Take by mouth.   docusate sodium (COLACE) 100 MG capsule Take by mouth 2 (two) times daily.   fluticasone (FLONASE) 50 MCG/ACT nasal spray Place 1 spray into both nostrils daily.   hydrocortisone 2.5 % ointment    ketoconazole (NIZORAL) 2 % cream    Omega-3 Fatty Acids (FISH OIL PO) Take 1 capsule by mouth daily.    pantoprazole (PROTONIX) 40 MG tablet Take 1 tablet (40 mg total) by mouth daily.   polyethylene glycol (MIRALAX / GLYCOLAX) 17 g packet Take 17 g by mouth daily.   rosuvastatin (CRESTOR) 10 MG tablet Take 1 tablet (10 mg total) by mouth daily.   triamcinolone lotion (KENALOG) 0.1 % Apply 1 application topically daily as needed.   No facility-administered medications prior to visit.    Review of Systems   Constitutional:  Negative for fever.  Endocrine: Positive for polyuria.  Genitourinary:  Positive for difficulty urinating and frequency. Negative for dysuria, flank pain and hematuria.  All other systems reviewed and are negative.  Last CBC Lab Results  Component Value Date   WBC 4.7 10/10/2020   HGB 12.4 10/10/2020   HCT 38.5 10/10/2020   MCV 95 10/10/2020   MCH 30.6 10/10/2020   RDW 11.6 (L) 10/10/2020   PLT 154 93/90/3009   Last metabolic panel Lab Results  Component Value Date   GLUCOSE 91 10/10/2020   NA 145 (H) 10/10/2020   K 4.0 10/10/2020   CL 105 10/10/2020   CO2 27 10/10/2020   BUN 21 10/10/2020   CREATININE 0.87 10/10/2020   EGFR 69 10/10/2020   CALCIUM 9.8 10/10/2020   PROT 6.7 10/10/2020   ALBUMIN 4.8 (H) 10/10/2020   LABGLOB 1.9 10/10/2020   AGRATIO 2.5 (H) 10/10/2020   BILITOT 0.2 10/10/2020   ALKPHOS 62 10/10/2020   AST 23 10/10/2020   ALT 17 10/10/2020   ANIONGAP 9 10/03/2019   Last lipids Lab Results  Component Value Date   CHOL 201 (H) 10/10/2020   HDL 66 10/10/2020   LDLCALC 122 (H) 10/10/2020   TRIG 69 10/10/2020   CHOLHDL 3.0 10/10/2020   Last hemoglobin A1c No results found for: HGBA1C Last thyroid functions Lab Results  Component Value Date   TSH 2.170  03/30/2021   T4TOTAL 6.6 02/10/2018   Last vitamin D Lab Results  Component Value Date   VD25OH 44.6 11/20/2019   Last vitamin B12 and Folate No results found for: VITAMINB12, FOLATE     Objective    BP 123/64 (BP Location: Right Arm, Patient Position: Sitting, Cuff Size: Normal)   Pulse 72   Temp 97.9 F (36.6 C) (Oral)   Wt 129 lb 6.4 oz (58.7 kg)   SpO2 92% Comment: patient washed hands in cold water  BMI 25.27 kg/m  BP Readings from Last 3 Encounters:  04/15/21 123/64  03/30/21 126/67  10/10/20 104/67   Wt Readings from Last 3 Encounters:  04/15/21 129 lb 6.4 oz (58.7 kg)  03/30/21 129 lb 6.4 oz (58.7 kg)  10/10/20 133 lb (60.3 kg)      Physical  Exam Constitutional:      General: She is awake.     Appearance: She is well-developed.  HENT:     Head: Normocephalic.  Eyes:     Conjunctiva/sclera: Conjunctivae normal.  Cardiovascular:     Rate and Rhythm: Normal rate and regular rhythm.     Heart sounds: Normal heart sounds.  Pulmonary:     Effort: Pulmonary effort is normal.     Breath sounds: Normal breath sounds.  Abdominal:     General: Abdomen is flat. There is no distension.     Palpations: Abdomen is soft.     Tenderness: There is no abdominal tenderness. There is no guarding or rebound.  Skin:    General: Skin is warm.  Neurological:     Mental Status: She is alert and oriented to person, place, and time.  Psychiatric:        Attention and Perception: Attention normal.        Mood and Affect: Mood normal.        Speech: Speech normal.        Behavior: Behavior is cooperative.     Results for orders placed or performed in visit on 04/15/21  POCT Urinalysis Dipstick  Result Value Ref Range   Color, UA yellow    Clarity, UA cloudy    Glucose, UA Negative Negative   Bilirubin, UA negative    Ketones, UA negative    Spec Grav, UA 1.015 1.010 - 1.025   Blood, UA small    pH, UA 5.0 5.0 - 8.0   Protein, UA Negative Negative   Urobilinogen, UA 0.2 0.2 or 1.0 E.U./dL   Nitrite, UA negative    Leukocytes, UA Moderate (2+) (A) Negative   Appearance     Odor      Assessment & Plan     recurrent urinary tract infection UA dip positive for leukocytes negative for nitrates negative for blood Will repeat culture and decide if additional antibiotic therapy is needed.  Patient has chronic pelvic floor and bladder issues, recommend she follow-up with her GYN who manages her pessary.  She is having increased difficulty fully voiding, if culture is negative and no further treatment is needed we will consider bladder ultrasound if she cannot see her gynecologist in a timely manner.  Return if symptoms worsen or fail to  improve.     I, Mikey Kirschner, PA-C have reviewed all documentation for this visit. The documentation on  04/15/2021  for the exam, diagnosis, procedures, and orders are all accurate and complete.    Mikey Kirschner, PA-C  [facility] [phone] (phone) [phone] (fax)  [place]

## 2021-04-15 NOTE — Patient Instructions (Signed)
Follow up with GYN for chronic pelvic floor and bladder issues.

## 2021-04-20 ENCOUNTER — Telehealth: Payer: Self-pay

## 2021-04-20 LAB — SPECIMEN STATUS REPORT

## 2021-04-20 LAB — URINE CULTURE

## 2021-04-20 NOTE — Telephone Encounter (Signed)
Copied from [place] [id number]. Topic: General - Other >> Apr 20, 2021 11:26 AM Celene Kras wrote: Reason for CRM: Pt calling and is requesting to have a nurse give a call back to go over her culture results with her. Please advise.

## 2021-04-21 NOTE — Telephone Encounter (Signed)
Patient called in to inform Ann Young that she need to get a call back with the result of her urine culture. Need this call so that she can know if she need to call her GYN. Can be reached at Ph# [phone]

## 2021-04-21 NOTE — Telephone Encounter (Signed)
Can you review urine culture it dosent look like it has been viewed, I believe it was mixed urogenital flora. KW

## 2021-04-22 ENCOUNTER — Other Ambulatory Visit: Payer: Self-pay | Admitting: Physician Assistant

## 2021-04-22 DIAGNOSIS — M6289 Other specified disorders of muscle: Secondary | ICD-10-CM

## 2021-04-22 DIAGNOSIS — R350 Frequency of micturition: Secondary | ICD-10-CM

## 2021-04-22 NOTE — Progress Notes (Signed)
Pt was seen 10/19 for UTI, tx w/ abx course and symptoms only improved slightly, repeat culture negative. History of pelvic floor and bladder dysfunction, has pessary managed by GYN but no longer controlling symptoms. Advised gyn/uro consult.

## 2021-06-10 ENCOUNTER — Other Ambulatory Visit
Admission: RE | Admit: 2021-06-10 | Discharge: 2021-06-10 | Disposition: A | Discharge status: Home / Self Care | Payer: Medicare Other | Source: Ambulatory Visit | Attending: Student | Admitting: Student

## 2021-06-10 DIAGNOSIS — Z03818 Encounter for observation for suspected exposure to other biological agents ruled out: Secondary | ICD-10-CM | POA: Diagnosis present

## 2021-06-10 DIAGNOSIS — R0602 Shortness of breath: Secondary | ICD-10-CM | POA: Insufficient documentation

## 2021-06-10 DIAGNOSIS — R9389 Abnormal findings on diagnostic imaging of other specified body structures: Secondary | ICD-10-CM | POA: Diagnosis present

## 2021-06-10 LAB — TROPONIN I (HIGH SENSITIVITY): Troponin I (High Sensitivity): 6 ng/L (ref <18)

## 2021-06-10 LAB — D-DIMER, QUANTITATIVE: D-Dimer, Quant: 0.36 ug/mL-FEU (ref 0.00–0.50)

## 2021-06-11 ENCOUNTER — Ambulatory Visit: Payer: Medicare Other | Admitting: Physician Assistant

## 2021-06-12 ENCOUNTER — Emergency Department: Payer: Medicare Other

## 2021-06-12 ENCOUNTER — Other Ambulatory Visit: Payer: Self-pay

## 2021-06-12 ENCOUNTER — Emergency Department
Admission: EM | Admit: 2021-06-12 | Discharge: 2021-06-12 | Disposition: A | Discharge status: Home / Self Care | Payer: Medicare Other | Attending: Emergency Medicine | Admitting: Emergency Medicine

## 2021-06-12 DIAGNOSIS — J45909 Unspecified asthma, uncomplicated: Secondary | ICD-10-CM | POA: Insufficient documentation

## 2021-06-12 DIAGNOSIS — Z85038 Personal history of other malignant neoplasm of large intestine: Secondary | ICD-10-CM | POA: Diagnosis not present

## 2021-06-12 DIAGNOSIS — G479 Sleep disorder, unspecified: Secondary | ICD-10-CM | POA: Insufficient documentation

## 2021-06-12 DIAGNOSIS — R059 Cough, unspecified: Secondary | ICD-10-CM | POA: Diagnosis present

## 2021-06-12 DIAGNOSIS — J101 Influenza due to other identified influenza virus with other respiratory manifestations: Secondary | ICD-10-CM | POA: Diagnosis not present

## 2021-06-12 DIAGNOSIS — Z7982 Long term (current) use of aspirin: Secondary | ICD-10-CM | POA: Diagnosis not present

## 2021-06-12 LAB — CBC
HCT: 36.1 % (ref 36.0–46.0)
Hemoglobin: 11.8 g/dL — ABNORMAL LOW (ref 12.0–15.0)
MCH: 31.3 pg (ref 26.0–34.0)
MCHC: 32.7 g/dL (ref 30.0–36.0)
MCV: 95.8 fL (ref 80.0–100.0)
Platelets: 129 10*3/uL — ABNORMAL LOW (ref 150–400)
RBC: 3.77 MIL/uL — ABNORMAL LOW (ref 3.87–5.11)
RDW: 11.9 % (ref 11.5–15.5)
WBC: 4.8 10*3/uL (ref 4.0–10.5)
nRBC: 0 % (ref 0.0–0.2)

## 2021-06-12 LAB — BASIC METABOLIC PANEL
Anion gap: 6 (ref 5–15)
BUN: 13 mg/dL (ref 8–23)
CO2: 28 mmol/L (ref 22–32)
Calcium: 9.4 mg/dL (ref 8.9–10.3)
Chloride: 105 mmol/L (ref 98–111)
Creatinine, Ser: 0.82 mg/dL (ref 0.44–1.00)
GFR, Estimated: >60 mL/min (ref >60)
Glucose, Bld: 92 mg/dL (ref 70–99)
Potassium: 3.3 mmol/L — ABNORMAL LOW (ref 3.5–5.1)
Sodium: 139 mmol/L (ref 135–145)

## 2021-06-12 LAB — TROPONIN I (HIGH SENSITIVITY): Troponin I (High Sensitivity): 6 ng/L (ref <18)

## 2021-06-12 MED ORDER — GUAIFENESIN-CODEINE 100-10 MG/5ML PO SOLN: 5.0000 mL | Freq: Four times a day (QID) | ORAL | 0 refills | Status: DC | PRN

## 2021-06-12 MED ORDER — GUAIFENESIN ER 600 MG PO TB12: 600.0000 mg | ORAL_TABLET | Freq: Every day | ORAL | 0 refills | Status: DC | PRN

## 2021-06-12 NOTE — ED Provider Notes (Signed)
[facility] Emergency Department Provider Note  Time seen: 1:22 PM  I have reviewed the triage vital signs and the nursing notes.   HISTORY  Chief Complaint Chest Pain and Shortness of Breath   HPI Ann Young is a 80 y.o. female with a past medical history of anxiety, depression, gastric reflux, presents to the emergency department for shortness of breath and difficulty sleeping.  According to the patient she tested positive for influenza 2 days ago.  Has had symptoms for approximately 1 week.  States she is experiencing a lot of congestion and cough at night that is keeping her awake, largely improved during the day patient was seen by her doctor and prescribed albuterol inhaler, prednisone, Levaquin and benzonatate.  States medication is helping but she feels like she cannot sleep at night.  States she sat in a recliner last night which did help somewhat.  States she feels very jittery as well.  Denies any chest pain.  Denies any shortness of breath.   Past Medical History:  Diagnosis Date   Actinic keratosis    Anxiety    Arthritis    Asthma    Cancer ([place])    colorectal   Cataract cortical, senile, bilateral    Depression    Dyspepsia    Dyspepsia    Elevated cholesterol    Esophagitis    Gastritis    GERD (gastroesophageal reflux disease)    Helicobacter pylori (H. pylori) infection    Hemorrhoids    Hiatal hernia    History of hiatal hernia    Hx of adenomatous colonic polyps    Osteoporosis    Peptic ulcer disease    Peptic ulcer disease    Shingles    Squamous cell carcinoma of skin    R temple, SCC IS txted with LN2 and 5FU from [facility]    Patient Active Problem List   Diagnosis Date Noted   Other specified hypothyroidism 03/30/2021   MDD (major depressive disorder), recurrent, in partial remission ([place]) 03/30/2021   Pelvic pain in female 06/06/2019   Thickened endometrium 07/04/2017   Ketonuria 09/03/2016   Chronic reflux  esophagitis 07/20/2016   Multiple gastric polyps 07/20/2016   Osteoporosis 05/07/2015   Constipation, chronic 05/07/2015   History of colorectal cancer 05/07/2015   Arthritis 05/07/2015   Other hyperlipidemia 05/07/2015   GERD with stricture 05/07/2015   Low back pain 02/10/2015   Malignant neoplasm ([place]) 10/28/2014   History of colonic polyps 10/28/2014   History of malignant neoplasm of large intestine 10/28/2014   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 Willy Eddy   COLON SURGERY     COLONOSCOPY N/A 10/03/2019   Procedure: COLONOSCOPY;  Surgeon: Toledo, Benay Pike, MD;  Location: [facility] ENDOSCOPY;  Service: Gastroenterology;  Laterality: N/A;   COLONOSCOPY WITH PROPOFOL N/A 02/04/2016   Procedure: COLONOSCOPY WITH PROPOFOL;  Surgeon: Manya Silvas, MD;  Location[facility] ENDOSCOPY;  Service: Endoscopy;  Laterality: N/A;   ESOPHAGOGASTRODUODENOSCOPY (EGD) WITH PROPOFOL N/A 02/04/2016   Procedure: ESOPHAGOGASTRODUODENOSCOPY (EGD) WITH PROPOFOL;  Surgeon: Manya Silvas, MD;  Location: [facility] ENDOSCOPY;  Service: Endoscopy;  Laterality: N/A;   ESOPHAGOGASTRODUODENOSCOPY (EGD) WITH PROPOFOL N/A 10/03/2019   Procedure: ESOPHAGOGASTRODUODENOSCOPY (EGD) WITH PROPOFOL;  Surgeon: Toledo, Benay Pike, MD;  Location: [facility] ENDOSCOPY;  Service: Gastroenterology;  Laterality: N/A;   HYSTEROSCOPY WITH D & C N/A 02/04/2017   Procedure: DILATATION AND CURETTAGE /HYSTEROSCOPY;  Surgeon: Schermerhorn, Gwen Her, MD;  Location: [facility] ORS;  Service: Gynecology;  Laterality: N/A;   resection of colorectal cancer without sequela  1999   resection of colorectal  cancer without sequela     TUBAL LIGATION  1976    Prior to Admission medications   Medication Sig Start Date End Date Taking? Authorizing Provider  Ascorbic Acid (VITAMIN C) 1000 MG tablet Take 1,000 mg by mouth daily.    [provider]  aspirin 81 MG tablet Take 81 mg by mouth daily.    [provider]  busPIRone (BUSPAR) 10 MG tablet Take 1 tablet (10 mg total) by mouth daily as needed. 03/31/21 06/29/21  Mikey Kirschner, PA-C  Calcium Carb-Cholecalciferol 600-200 MG-UNIT TABS Take 1 tablet by mouth 2 (two) times daily.    [provider]  cetirizine (ZYRTEC) 10 MG tablet TAKE 1 TABLET BY MOUTH ONCE DAILY FOR ALLERGIES 03/31/21   Mikey Kirschner, PA-C  Cholecalciferol (VITAMIN D PO) Take by mouth.    [provider]  docusate sodium (COLACE) 100 MG capsule Take by mouth 2 (two) times daily.    [provider]  fluticasone (FLONASE) 50 MCG/ACT nasal spray Place 1 spray into both nostrils daily. 03/31/21 04/30/21  Mikey Kirschner, PA-C  hydrocortisone 2.5 % ointment  03/02/19   [provider]  ketoconazole (NIZORAL) 2 % cream  03/02/19   [provider]  Omega-3 Fatty Acids (FISH OIL PO) Take 1 capsule by mouth daily.     [provider]  pantoprazole (PROTONIX) 40 MG tablet Take 1 tablet (40 mg total) by mouth daily. 03/31/21 06/29/21  Mikey Kirschner, PA-C  polyethylene glycol (MIRALAX / GLYCOLAX) 17 g packet Take 17 g by mouth daily.    [provider]  rosuvastatin (CRESTOR) 10 MG tablet Take 1 tablet (10 mg total) by mouth daily. 03/31/21   Mikey Kirschner, PA-C  triamcinolone lotion (KENALOG) 0.1 % Apply 1 application topically daily as needed.    [provider]    Allergies  Allergen Reactions   Clarithromycin Other (See Comments)    Questionable Biaxin vs Flagyl with chest discomfort & irregular heartbeat (most likely thought to be related to the Biaxin) Other reaction(s): Other (See  Comments) Questionable Biaxin vs Flagyl with chest discomfort & irregular heartbeat (most likely thought to be related to the Biaxin) Questionable Biaxin vs Flagyl with chest discomfort & irregular heartbeat (most likely thought to be related to the Biaxin)   Fesoterodine     Other reaction(s): Other (See Comments) weakness Other reaction(s): Other (See Comments) Other reaction(s): Other (See Comments) weakness weakness   Ibuprofen Other (See Comments)    To prevent swelling,increase hr/bp Other reaction(s): Other (See Comments) To prevent swelling,increase hr/b PATIENT STATES NOT ALLERGIC TO IBUPROFEN   Lamotrigine Other (See Comments)    Hair thinning Hair thinning Other reaction(s): Unknown Hair thinning Hair thinning    Metronidazole Other (See Comments)    Questionable Biaxin vs Flagyl with chest discomfort & irregular heartbeat (most likely thought to be related to the Biaxin) Other reaction(s): Other (See Comments), Other (See Comments) Questionable Biaxin vs Flagyl with chest discomfort & irregular heartbeat (most likely thought to be related to the  Biaxin) Questionable Biaxin vs Flagyl with chest discomfort & irregular heartbeat (most likely thought to be related to the Biaxin)   Other Other (See Comments)    decongestants   Penicillins Nausea Only   Simvastatin Other (See Comments)    Hari Loss Other reaction(s): Other (See Comments), Other (See Comments) Hari Loss Hari Loss   Sulfa Antibiotics     Other reaction(s): Unknown Other reaction(s): Unknown Patient cant remember   Toviaz  [Fesoterodine Fumarate Er] Other (See Comments)    weakness    Family History  Problem Relation Age of Onset   Cirrhosis Sister    Colon polyps Sister    Leukemia Mother    Depression Mother    Heart disease Father    Depression Father    Bipolar disorder Daughter    Prostate cancer Neg Hx    Kidney cancer Neg Hx    Breast cancer Neg Hx     Social History Social History    Tobacco Use   Smoking status: Never   Smokeless tobacco: Never  Vaping Use   Vaping Use: Never used  Substance Use Topics   Alcohol use: No    Alcohol/week: 0.0 standard drinks   Drug use: No    Review of Systems Constitutional: Negative for fever. ENT: Positive for continued congestion Cardiovascular: Negative for chest pain. Respiratory: Negative for shortness of breath.  Positive for cough, worse at night. Gastrointestinal: Negative for abdominal pain, vomiting  Musculoskeletal: Negative for musculoskeletal complaints Skin: Negative for skin complaints  Neurological: Negative for headache All other ROS negative  ____________________________________________   PHYSICAL EXAM:  VITAL SIGNS: ED Triage Vitals  Enc Vitals Group     BP 06/12/21 1239 137/67     Pulse Rate 06/12/21 1239 68     Resp 06/12/21 1239 18     Temp 06/12/21 1239 98.2 F (36.8 C)     Temp src --      SpO2 06/12/21 1239 95 %     Weight 06/12/21 1312 129 lb 6.6 oz (58.7 kg)     Height 06/12/21 1312 5' (1.524 m)     Head Circumference --      Peak Flow --      Pain Score 06/12/21 1235 7     Pain Loc --      Pain Edu? --      Excl. in [place]? --    Constitutional: Alert and oriented. Well appearing and in no distress. Eyes: Normal exam ENT      Head: Normocephalic and atraumatic.      Mouth/Throat: Mucous membranes are moist. Cardiovascular: Normal rate, regular rhythm.  Respiratory: Normal respiratory effort without tachypnea nor retractions. Breath sounds are clear and equal bilaterally. No wheezes/rales/rhonchi. Gastrointestinal: Soft and nontender. No distention.  Musculoskeletal: Nontender with normal range of motion in all extremities.  Neurologic:  Normal speech and language. No gross focal neurologic deficits Skin:  Skin is warm, dry and intact.  Psychiatric: Mood and affect are normal  ____________________________________________    EKG  EKG viewed and interpreted by myself shows  a normal sinus rhythm at 71 bpm with a narrow QRS, normal axis, normal intervals, no concerning ST changes.  ____________________________________________    RADIOLOGY  Chest x-ray shows stable findings no acute abnormality.  ____________________________________________   INITIAL IMPRESSION / ASSESSMENT AND PLAN / ED COURSE  Pertinent labs & imaging results that were available during my care of the patient were reviewed by me and considered in my medical  decision making (see chart for details).   Patient presents emergency department for cough congestion difficulty sleeping at night.  Patient was diagnosed influenza 2 days ago symptoms x1 week.  Taking medications prescribed by her doctor.  I discussed with the patient discontinuing prednisone as I believe this is likely causing her to feel anxious/jittery and likely keeping her awake at night as well.  We will obtain a CT scan of the chest as a precaution to rule out pleural effusion or occult pneumonia however I feel the patient would likely be able to discontinue Levaquin as well.  Patient's lab work is overall reassuring.  Chest x-ray is stable.  Lungs the patient CT scan and lab work are within normal limits anticipate discharge home placing the patient on a codeine-based cough medication to take at night.  She could use the benzonatate and guaifenesin during the day and discontinue use of Levaquin and prednisone.  CT scan shows scattered tree-in-bud opacities consistent with infectious/inflammatory etiology.  We will place the patient on guaifenesin.  I discussed with the patient discontinuing prednisone.  We will continue Levaquin given the mucus impaction/plugging.  We will place the patient on guaifenesin during the daytime and guaifenesin/codeine at night.  Patient agreeable to plan of care.  Ann Young was evaluated in Emergency Department on 06/12/2021 for the symptoms described in the history of present illness. She was  evaluated in the context of the global COVID-19 pandemic, which necessitated consideration that the patient might be at risk for infection with the SARS-CoV-2 virus that causes COVID-19. Institutional protocols and algorithms that pertain to the evaluation of patients at risk for COVID-19 are in a state of rapid change based on information released by regulatory bodies including the CDC and federal and state organizations. These policies and algorithms were followed during the patient's care in the ED.  ____________________________________________   FINAL CLINICAL IMPRESSION(S) / ED DIAGNOSES  Influenza A   Harvest Dark, MD 06/12/21 1444

## 2021-06-12 NOTE — ED Triage Notes (Signed)
Pt c/o increasing center chest pain and SOB increasing at night x 1 week.  Pt reports productive cough.  Sts mucus is clear.  Pt was diagnosed w/ the flu x 2 days and prescribed an inhaler, prednisone, Levaquin, and Benzonate.  Pt has not taken any medication today.   Pt able to easliy speak full sentences.

## 2021-06-12 NOTE — Discharge Instructions (Addendum)
Your work-up in the emergency department is overall reassuring.  As we discussed please discontinue use of prednisone.  Please begin taking your medications as follows. In the morning take: -Levaquin -Guaifenesin/Mucinex -Tessalon/benzonatate (every 6 hours as needed for cough) -Albuterol, 2 puffs every 6 hours as needed for trouble breathing/mucous  In the evening take: -Guaifenesin/codeine (as needed for cough/congestion) -Albuterol, 2 puffs every 6 hours as needed for trouble breathing/mucous  You may stop these medications when your symptoms improved/resolve.

## 2021-06-23 ENCOUNTER — Ambulatory Visit (INDEPENDENT_AMBULATORY_CARE_PROVIDER_SITE_OTHER): Payer: Medicare Other | Admitting: Dermatology

## 2021-06-23 ENCOUNTER — Other Ambulatory Visit: Payer: Self-pay

## 2021-06-23 DIAGNOSIS — L821 Other seborrheic keratosis: Secondary | ICD-10-CM

## 2021-06-23 DIAGNOSIS — R202 Paresthesia of skin: Secondary | ICD-10-CM | POA: Diagnosis not present

## 2021-06-23 DIAGNOSIS — L814 Other melanin hyperpigmentation: Secondary | ICD-10-CM | POA: Diagnosis not present

## 2021-06-23 DIAGNOSIS — Z86007 Personal history of in-situ neoplasm of skin: Secondary | ICD-10-CM | POA: Diagnosis not present

## 2021-06-23 DIAGNOSIS — L820 Inflamed seborrheic keratosis: Secondary | ICD-10-CM | POA: Diagnosis not present

## 2021-06-23 NOTE — Patient Instructions (Addendum)
Cryotherapy Aftercare  Wash gently with soap and water everyday.   Apply Vaseline and Band-Aid daily until healed.   Notalgia paresthetica is a chronic condition affecting the skin of the back in which a pinched nerve along the spine causes itching or changes in sensation in an area of skin. This is usually accompanied by chronic rubbing or scratching. There is no cure, but there are some treatments which may help control the itch.   Over the counter (non-prescription) treatments for notalgia paresthetica include numbing creams like pramoxine or lidocaine which temporarily reduce itch or Capsaicin-containing creams which cause a burning sensation but which sometimes over time will reset the nerves to stop producing itch.   If you choose to use Capsaicin cream, it is recommended to use it 5 times daily for 1 week followed by 3 times daily for 3-6 weeks. You may have to continue using it long-term. For severe cases, there are some prescription cream or pill options which may help.  Recommend OTC Gold Bond Rapid Relief Anti-Itch cream (pramoxine + menthol), CeraVe Anti-itch cream or lotion (pramoxine), Sarna lotion (Original- menthol + camphor or Sensitive- pramoxine) or Eucerin 12 hour Itch Relief lotion (menthol) up to 3 times per day to areas on body that are itchy.  If You Need Anything After Your Visit  If you have any questions or concerns for your doctor, please call our main line at [phone] and press option 4 to reach your doctor's medical assistant. If no one answers, please leave a voicemail as directed and we will return your call as soon as possible. Messages left after 4 pm will be answered the following business day.   You may also send [place] a message via [place]. We typically respond to MyChart messages within 1-2 business days.  For prescription refills, please ask your pharmacy to contact our office. Our fax number is [phone].  If you have an urgent issue when the clinic is  closed that cannot wait until the next business day, you can page your doctor at the number below.    Please note that while we do our best to be available for urgent issues outside of office hours, we are not available 24/7.   If you have an urgent issue and are unable to reach [place], you may choose to seek medical care at your doctor's office, retail clinic, urgent care center, or emergency room.  If you have a medical emergency, please immediately call 911 or go to the emergency department.  Pager Numbers  - Dr. Nehemiah Massed: [phone]  - Dr. Laurence Ferrari: [phone]  - Dr. Nicole Kindred: [phone]  In the event of inclement weather, please call our main line at [phone] for an update on the status of any delays or closures.  Dermatology Medication Tips: Please keep the boxes that topical medications come in in order to help keep track of the instructions about where and how to use these. Pharmacies typically print the medication instructions only on the boxes and not directly on the medication tubes.   If your medication is too expensive, please contact our office at [phone] option 4 or send [place] a message through [place].   We are unable to tell what your co-pay for medications will be in advance as this is different depending on your insurance coverage. However, we may be able to find a substitute medication at lower cost or fill out paperwork to get insurance to cover a needed medication.   If a prior authorization is required to  get your medication covered by your insurance company, please allow [place] 1-2 business days to complete this process.  Drug prices often vary depending on where the prescription is filled and some pharmacies may offer cheaper prices.  The website www.goodrx.com contains coupons for medications through different pharmacies. The prices here do not account for what the cost may be with help from insurance (it may be cheaper with your insurance), but the website can  give you the price if you did not use any insurance.  - You can print the associated coupon and take it with your prescription to the pharmacy.  - You may also stop by our office during regular business hours and pick up a GoodRx coupon card.  - If you need your prescription sent electronically to a different pharmacy, notify our office through [facility] or by phone at [phone] option 4.     Si Usted Necesita Algo Despus de Su Visita  Tambin puede enviarnos un mensaje a travs de Pharmacist, community. Por lo general respondemos a los mensajes de MyChart en el transcurso de 1 a 2 das hbiles.  Para renovar recetas, por favor pida a su farmacia que se ponga en contacto con nuestra oficina. Harland Dingwall de fax es [place] [phone].  Si tiene un asunto urgente cuando la clnica est cerrada y que no puede esperar hasta el siguiente da hbil, puede llamar/localizar a su doctor(a) al nmero que aparece a continuacin.   Por favor, tenga en cuenta que aunque hacemos todo lo posible para estar disponibles para asuntos urgentes fuera del horario de [place], no estamos disponibles las 24 horas del da, los 7 das de la [place].   Si tiene un problema urgente y no puede comunicarse con nosotros, puede optar por buscar atencin mdica  en el consultorio de su doctor(a), en una clnica privada, en un centro de atencin urgente o en una sala de emergencias.  Si tiene Engineering geologist, por favor llame inmediatamente al 911 o vaya a la sala de emergencias.  Nmeros de bper  - Dr. Nehemiah Massed: [phone]  - Dra. Moye: [phone]  - Dra. Nicole Kindred: [phone]  En caso de inclemencias del [place], por favor llame a Johnsie Kindred principal al [phone] para una actualizacin sobre el [place] de cualquier retraso o cierre.  Consejos para la medicacin en dermatologa: Por favor, guarde las cajas en las que vienen los medicamentos de uso tpico para ayudarle a seguir las instrucciones sobre  dnde y cmo usarlos. Las farmacias generalmente imprimen las instrucciones del medicamento slo en las cajas y no directamente en los tubos del [place].   Si su medicamento es muy caro, por favor, pngase en contacto con Zigmund Daniel llamando al [phone] y presione la opcin 4 o envenos un mensaje a travs de Pharmacist, community.   No podemos decirle cul ser su copago por los medicamentos por adelantado ya que esto es diferente dependiendo de la cobertura de su seguro. Sin embargo, es posible que podamos encontrar un medicamento sustituto a Electrical engineer un formulario para que el seguro cubra el medicamento que se considera necesario.   Si se requiere una autorizacin previa para que su compaa de seguros [place] su medicamento, por favor permtanos de 1 a 2 das hbiles para completar este proceso.  Los precios de los medicamentos varan con frecuencia dependiendo del Environmental consultant de dnde se surte la receta y alguna farmacias pueden ofrecer precios ms baratos.  El sitio web www.goodrx.com tiene cupones para medicamentos de Airline pilot.  Los precios aqu no tienen en cuenta lo que podra costar con la ayuda del seguro (puede ser ms barato con su seguro), pero el sitio web puede darle el precio si no utiliz Research scientist (physical sciences).  - Puede imprimir el cupn correspondiente y llevarlo con su receta a la farmacia.  - Tambin puede pasar por nuestra oficina durante el horario de atencin regular y Charity fundraiser una tarjeta de cupones de GoodRx.  - Si necesita que su receta se enve electrnicamente a una farmacia diferente, informe a nuestra oficina a travs de MyChart de [place] o por telfono llamando al [phone] y presione la opcin 4.

## 2021-06-23 NOTE — Progress Notes (Signed)
Follow-Up Visit   Subjective  Ann Young is a 81 y.o. female who presents for the following: Follow-up (Patient here today for 6 month follow up. Patient with scaly spots at right arm and upper back, present for a few months. Patient defers total body skin exam. ).  Some spots are itching.  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  The following portions of the chart were reviewed this encounter and updated as appropriate:       Review of Systems:  No other skin or systemic complaints except as noted in HPI or Assessment and Plan.  Objective  Well appearing patient in no apparent distress; mood and affect are within normal limits.  A focused examination was performed including arms, back. Relevant physical exam findings are noted in the Assessment and Plan.  Right Upper Arm x 1, spinal upper back x 1, left scapula x 1 (3) Erythematous stuck-on, waxy papule  Left lower scapula Hyperpigmented patch    Assessment & Plan  Inflamed seborrheic keratosis (3) Right Upper Arm x 1, spinal upper back x 1, left scapula x 1  Destruction of lesion - Right Upper Arm x 1, spinal upper back x 1, left scapula x 1  Destruction method: cryotherapy   Informed consent: discussed and consent obtained   Lesion destroyed using liquid nitrogen: Yes   Region frozen until ice ball extended beyond lesion: Yes   Outcome: patient tolerated procedure well with no complications   Post-procedure details: wound care instructions given   Additional details:  Prior to procedure, discussed risks of blister formation, small wound, skin dyspigmentation, or rare scar following cryotherapy. Recommend Vaseline ointment to treated areas while healing.   Notalgia paresthetica Left lower scapula  Notalgia paresthetica is a chronic condition affecting the skin of the back in which a pinched nerve along the spine causes itching or changes in sensation in an area of skin. This is usually accompanied by chronic  rubbing or scratching. There is no cure, but there are some treatments which may help control the itch.   Over the counter (non-prescription) treatments for notalgia paresthetica include numbing creams like pramoxine or lidocaine which temporarily reduce itch or Capsaicin-containing creams which cause a burning sensation but which sometimes over time will reset the nerves to stop producing itch.   If you choose to use Capsaicin cream, it is recommended to use it 5 times daily for 1 week followed by 3 times daily for 3-6 weeks. You may have to continue using it long-term. For severe cases, there are some prescription cream or pill options which may help.  Recommend OTC Gold Bond Rapid Relief Anti-Itch cream (pramoxine + menthol), CeraVe Anti-itch cream or lotion (pramoxine), Sarna lotion (Original- menthol + camphor or Sensitive- pramoxine) or Eucerin 12 hour Itch Relief lotion (menthol) up to 3 times per day to areas on body that are itchy.   History of Squamous Cell Carcinoma in Situ of the Skin - No evidence of recurrence today at right temple - Recommend regular full body skin exams - Recommend daily broad spectrum sunscreen SPF 30+ to sun-exposed areas, reapply every 2 hours as needed.  - Call if any new or changing lesions are noted between office visits  Seborrheic Keratoses - Stuck-on, waxy, tan-brown papules and/or plaques  - Benign-appearing - Discussed benign etiology and prognosis. - Observe - Call for any changes  Lentigines - Scattered tan macules - Due to sun exposure - Benign-appering, observe - Recommend daily broad  spectrum sunscreen SPF 30+ to sun-exposed areas, reapply every 2 hours as needed. - Call for any changes    Return in about 6 months (around 12/21/2021) for TBSE.  Graciella Belton, RMA, am acting as scribe for Brendolyn Patty, MD .  Documentation: I have reviewed the above documentation for accuracy and completeness, and I agree with the above.  Brendolyn Patty MD

## 2021-06-30 ENCOUNTER — Other Ambulatory Visit: Payer: Self-pay

## 2021-06-30 ENCOUNTER — Ambulatory Visit (INDEPENDENT_AMBULATORY_CARE_PROVIDER_SITE_OTHER): Payer: Medicare Other | Admitting: Physician Assistant

## 2021-06-30 ENCOUNTER — Encounter: Payer: Self-pay | Admitting: Physician Assistant

## 2021-06-30 VITALS — BP 120/62 | HR 82 | Temp 98.5°F | Wt 125.0 lb

## 2021-06-30 DIAGNOSIS — R351 Nocturia: Secondary | ICD-10-CM

## 2021-06-30 DIAGNOSIS — F3341 Major depressive disorder, recurrent, in partial remission: Secondary | ICD-10-CM

## 2021-06-30 DIAGNOSIS — J309 Allergic rhinitis, unspecified: Secondary | ICD-10-CM

## 2021-06-30 DIAGNOSIS — E7849 Other hyperlipidemia: Secondary | ICD-10-CM

## 2021-06-30 DIAGNOSIS — K219 Gastro-esophageal reflux disease without esophagitis: Secondary | ICD-10-CM

## 2021-06-30 LAB — POCT URINALYSIS DIPSTICK
Bilirubin, UA: NEGATIVE
Blood, UA: NEGATIVE
Glucose, UA: NEGATIVE
Ketones, UA: NEGATIVE
Nitrite, UA: NEGATIVE
Protein, UA: NEGATIVE
Spec Grav, UA: 1.01 (ref 1.010–1.025)
Urobilinogen, UA: 0.2 E.U./dL
pH, UA: 7 (ref 5.0–8.0)

## 2021-06-30 MED ORDER — SUCRALFATE 1 G PO TABS: 1.0000 g | ORAL_TABLET | Freq: Four times a day (QID) | ORAL | 1 refills | Status: DC | PRN

## 2021-06-30 MED ORDER — LORATADINE 10 MG PO TABS: 10.0000 mg | ORAL_TABLET | Freq: Every day | ORAL | 2 refills | Status: DC

## 2021-06-30 MED ORDER — SERTRALINE HCL 50 MG PO TABS: 50.0000 mg | ORAL_TABLET | Freq: Every day | ORAL | 0 refills | Status: DC

## 2021-06-30 NOTE — Progress Notes (Signed)
Established patient visit   Patient: Ann Young   DOB: 1941-04-20   81 y.o. Female  MRN: [id number] Visit Date: 06/30/2021  Today's healthcare provider: Mikey Kirschner, PA-C   Chief Complaint  Patient presents with   Hyperlipidemia   Gastroesophageal Reflux   Subjective     GERD --Takes protonix 40 mg daily. Reports symptoms of heartburn, belching, reflux occurring more frequently at night, wakes from sleep. Has raised head of bed. Does not happen every night, unsure if related to certain foods--she reports eating what she likes, does not avoid triggers.   Anxiety/Depression -Takes Buspar as needed, maybe 3 times a week. Unsure if benefit, used to take at some point daily, unsure if that made her feel better. She has d/c remeron but did not start trazodone. Sleep is variable, some nights awake with racing thoughts, some nights she sleeps fine. Denies SI. Unsure what other psych meds she has taken previously, used to follow with a psychiatrist.   Nocturia -Still occurs, has f/u w/ GYN to discuss pessary and reportedly an ultrasound.  Lipid/Cholesterol, Follow-up  Last lipid panel Other pertinent labs  Lab Results  Component Value Date   CHOL 201 (H) 10/10/2020   HDL 66 10/10/2020   LDLCALC 122 (H) 10/10/2020   TRIG 69 10/10/2020   CHOLHDL 3.0 10/10/2020   Lab Results  Component Value Date   ALT 17 10/10/2020   AST 23 10/10/2020   PLT 129 (L) 06/12/2021   TSH 2.170 03/30/2021     She was last seen for this 3 months ago.  Management since that visit includes restarting rosuvastatin 5mg  daily.  She reports poor compliance with treatment. Pt states she stopped it about four days ago. She is having side effects. Pt states her hair is thinning.   Symptoms: No chest pain No chest pressure/discomfort  No dyspnea No lower extremity edema  No numbness or tingling of extremity No orthopnea  No palpitations No paroxysmal nocturnal dyspnea  No speech difficulty  No syncope   Current diet: in general, a "healthy" diet   Current exercise: none  The 10-year ASCVD risk score (Arnett DK, et al., 2019) is: 19.9%    ---------------------------------------------------------------------------------------------------   Medications: Outpatient Medications Prior to Visit  Medication Sig   Ascorbic Acid (VITAMIN C) 1000 MG tablet Take 1,000 mg by mouth daily.   aspirin 81 MG tablet Take 81 mg by mouth daily.   Calcium Carb-Cholecalciferol 600-200 MG-UNIT TABS Take 1 tablet by mouth 2 (two) times daily.   Cholecalciferol (VITAMIN D PO) Take by mouth.   docusate sodium (COLACE) 100 MG capsule Take by mouth 2 (two) times daily.   hydrocortisone 2.5 % ointment    ketoconazole (NIZORAL) 2 % cream    Omega-3 Fatty Acids (FISH OIL PO) Take 1 capsule by mouth daily.    polyethylene glycol (MIRALAX / GLYCOLAX) 17 g packet Take 17 g by mouth daily.   triamcinolone lotion (KENALOG) 0.1 % Apply 1 application topically daily as needed.   [DISCONTINUED] cetirizine (ZYRTEC) 10 MG tablet TAKE 1 TABLET BY MOUTH ONCE DAILY FOR ALLERGIES   fluticasone (FLONASE) 50 MCG/ACT nasal spray Place 1 spray into both nostrils daily.   pantoprazole (PROTONIX) 40 MG tablet Take 1 tablet (40 mg total) by mouth daily.   [DISCONTINUED] guaiFENesin (MUCINEX) 600 MG 12 hr tablet Take 1 tablet (600 mg total) by mouth daily as needed for cough or to loosen phlegm.   [DISCONTINUED] guaiFENesin-codeine 100-10 MG/5ML syrup  Take 5 mLs by mouth every 6 (six) hours as needed for cough.   [DISCONTINUED] rosuvastatin (CRESTOR) 10 MG tablet Take 1 tablet (10 mg total) by mouth daily. (Patient not taking: Reported on 06/30/2021)   No facility-administered medications prior to visit.    Review of Systems  Constitutional: Negative.   Respiratory:  Negative for apnea, cough, choking, chest tightness, shortness of breath, wheezing and stridor.   Cardiovascular: Negative.   Gastrointestinal:  Negative  for abdominal distention, abdominal pain, anal bleeding, blood in stool, constipation, diarrhea, nausea, rectal pain and vomiting.  Neurological:  Negative for dizziness, light-headedness and headaches.  Psychiatric/Behavioral:  Positive for dysphoric mood and sleep disturbance. Negative for decreased concentration, self-injury and suicidal ideas. The patient is nervous/anxious.       Objective    BP 120/62 (BP Location: Right Arm, Patient Position: Sitting, Cuff Size: Normal)    Pulse 82    Temp 98.5 F (36.9 C) (Oral)    Wt 125 lb (56.7 kg)    SpO2 96%    BMI 24.41 kg/m    Physical Exam Constitutional:      General: She is awake.     Appearance: She is well-developed.  HENT:     Head: Normocephalic.  Eyes:     Conjunctiva/sclera: Conjunctivae normal.  Cardiovascular:     Rate and Rhythm: Normal rate and regular rhythm.     Heart sounds: Normal heart sounds.  Pulmonary:     Effort: Pulmonary effort is normal.     Breath sounds: Normal breath sounds.  Abdominal:     Palpations: Abdomen is soft.     Tenderness: There is no abdominal tenderness.  Skin:    General: Skin is warm.  Neurological:     Mental Status: She is alert and oriented to person, place, and time.  Psychiatric:        Attention and Perception: Attention normal.        Mood and Affect: Mood normal.        Speech: Speech normal.        Behavior: Behavior is cooperative.     Results for orders placed or performed in visit on 06/30/21  POCT Urinalysis Dipstick  Result Value Ref Range   Color, UA     Clarity, UA     Glucose, UA Negative Negative   Bilirubin, UA Negative    Ketones, UA Negative    Spec Grav, UA 1.010 1.010 - 1.025   Blood, UA Negative    pH, UA 7.0 5.0 - 8.0   Protein, UA Negative Negative   Urobilinogen, UA 0.2 0.2 or 1.0 E.U./dL   Nitrite, UA Negative    Leukocytes, UA Moderate (2+) (A) Negative   Appearance     Odor      Assessment & Plan     Problem List Items Addressed This  Visit       Respiratory   Allergic rhinitis    Pt wants to change antihistamines.        Relevant Medications   loratadine (CLARITIN) 10 MG tablet     Digestive   Gastroesophageal reflux disease without esophagitis - Primary    Last endo d/t dyspepsia w/ no abnormalities but a dilation was performed.  Advised she watch her food triggers and can take Sulcrafate before bed for a few days, prn, to see if this brings any benefit.  If symptoms persist, ref back to gi      Relevant Medications   sucralfate (CARAFATE)  1 g tablet     Other   Other hyperlipidemia    Pt self d/c crestor again as she thinks it is causing hair loss. Ok to stay off of crestor. We will recheck lipids to assess trend in 6 mo      MDD (major depressive disorder), recurrent, in partial remission ([place])    Was taking buspar 3 times a week for anxiety/depressive symptoms. Current plan is to switch to zoloft. D/c buspar, and start zoloft. Pt takes buspar infrequently enough, no plan to taper. F/u 6 weeks      Relevant Medications   sertraline (ZOLOFT) 50 MG tablet   Nocturia    Ongoing. UA + leuk again, symptoms are only nocturia, will wait for culture before deciding abx therapy culture      Relevant Orders   POCT Urinalysis Dipstick (Completed)   Urine Culture     Return in about 6 weeks (around 08/11/2021) for depression, anxiety.      I, Mikey Kirschner, PA-C have reviewed all documentation for this visit. The documentation on  06/30/2021  for the exam, diagnosis, procedures, and orders are all accurate and complete.    Mikey Kirschner, PA-C  [facility] [phone] (phone) [phone] (fax)  [place]

## 2021-06-30 NOTE — Assessment & Plan Note (Signed)
Pt wants to change antihistamines.

## 2021-06-30 NOTE — Assessment & Plan Note (Addendum)
Was taking buspar 3 times a week for anxiety/depressive symptoms. Current plan is to switch to zoloft. D/c buspar, and start zoloft. Pt takes buspar infrequently enough, no plan to taper. F/u 6 weeks

## 2021-06-30 NOTE — Assessment & Plan Note (Addendum)
Ongoing. UA + leuk again, symptoms are only nocturia, will wait for culture before deciding abx therapy culture

## 2021-06-30 NOTE — Assessment & Plan Note (Signed)
Last endo d/t dyspepsia w/ no abnormalities but a dilation was performed.  Advised she watch her food triggers and can take Sulcrafate before bed for a few days, prn, to see if this brings any benefit.  If symptoms persist, ref back to gi

## 2021-06-30 NOTE — Assessment & Plan Note (Signed)
Pt self d/c crestor again as she thinks it is causing hair loss. Ok to stay off of crestor. We will recheck lipids to assess trend in 6 mo

## 2021-07-02 LAB — URINE CULTURE

## 2021-07-06 ENCOUNTER — Other Ambulatory Visit: Payer: Self-pay | Admitting: Physician Assistant

## 2021-07-06 DIAGNOSIS — N3000 Acute cystitis without hematuria: Secondary | ICD-10-CM

## 2021-07-06 DIAGNOSIS — N3090 Cystitis, unspecified without hematuria: Secondary | ICD-10-CM

## 2021-07-06 MED ORDER — PENICILLIN V POTASSIUM 500 MG PO TABS: 500.0000 mg | ORAL_TABLET | Freq: Three times a day (TID) | ORAL | 0 refills | Status: AC

## 2021-07-15 ENCOUNTER — Ambulatory Visit: Payer: Self-pay | Admitting: *Deleted

## 2021-07-15 NOTE — Telephone Encounter (Signed)
I returned pt's call.   She called in earlier today c/o since starting on Zoloft 50 mg she is having tingling in her hands and toes, head feeling foggy, blurred vision and feeling like she wants to cry but holds it back.  She has been having nausea too but she thinks that was from the antibiotic which she completed day before yesterday.       It's hard for me to relax since starting the Zoloft.   At night time the first couple of hours I sleep well then I wake up and can't get back to sleep.          Reason for Disposition  [1] Caller has URGENT medicine question about med that PCP or specialist prescribed AND [2] triager unable to answer question    Having side effects from the Zoloft  Answer Assessment - Initial Assessment Questions 1. NAME of MEDICATION: "What medicine are you calling about?"     Zoloft.  I started it last Wed.  (I had to wait until I was paid to pick it up).   I saw Mikey Kirschner on 06/30/2021. 2. QUESTION: "What is your question?" (e.g., double dose of medicine, side effect)     Since starting the Zoloft I'm having tingling in my hands and toes, feeling foggy headed, my vision is a little blurry, and I have spells where I want to cry for no reason.   I don't cry but I feel like it and hold it back. 3. PRESCRIBING HCP: "Who prescribed it?" Reason: if prescribed by specialist, call should be referred to that group.     Ria Comment Drubel  4. SYMPTOMS: "Do you have any symptoms?"     Yes above 5. SEVERITY: If symptoms are present, ask "Are they mild, moderate or severe?"     Moderate 6. PREGNANCY:  "Is there any chance that you are pregnant?" "When was your last menstrual period?"     N/A  Protocols used: Medication Question Call-A-AH

## 2021-07-15 NOTE — Telephone Encounter (Signed)
The 10-year ASCVD risk score (Arnett DK, et al., 2019) is: 19.9% Recently off statin d/t SE  Started on zoloft last Wednesday Called back with concerns of:  Tingling in her hands and feet ** a few days ago, almost a numbness feeling, comes and goes  bilaterally, not one side more than the other  Nausea  Feelings of wanting to cry -- feelings running through head  Blurry vision** things look fuzzy , brief, comes and goes.  Hard to sit and relax  Issues with sleeping  On the phone w/ her: -no issues w/ speech or memory. Denies weakness, headaches, chest pain. Denies dizziness.  -Denies change in her face or the way she is walking.  Please d/c zolofy d/t SE Explained in detail signs of stroke or TIA strict guidelines of when to go to ED.  Flagged nurse box to sched. A phone visit w/ pt next week to discuss alternatives to zoloft.

## 2021-07-21 NOTE — Progress Notes (Signed)
I,Ann Young,acting as a Education administrator for [employer], PA-C.,have documented all relevant documentation on the behalf of Ann Kirschner, PA-C,as directed by  Ann Kirschner, PA-C while in the presence of Ann Kirschner, PA-C.   Established patient visit   Patient: Ann Young   DOB: 05-02-1941   81 y.o. Female  MRN: [id number] Visit Date: 07/22/2021  Today's healthcare provider: Mikey Kirschner, PA-C   Chief Complaint  Patient presents with   Follow-up   Anxiety   Subjective    HPI   Ann Young is a 81 y/o female who presents today for a follow up of d/c Zoloft d/t side effects She feels the vision changes and tingling and hands and toes has completely subsided. Still feels nauseous, but has been taking antibiotics until two weeks ago d/t frequent UTIs.   She would like to try another anxiety/depression medication, she recently lost her best friend, which has added to her symptoms.   She previously stopped her statin medication secondary to hair loss, but she does not think the hair loss was from the statin and would like to re-start again .  We switched from Zyrtec to Claritin and she has been having more allergic symptoms, PND, rhinorrhea, and feels it was better controlled on the Zyrtec.   She still feels symptoms of dysuria, feels the antibiotics work for only a few days and her symptoms return. She has an appt with a urologist 2/15 but they request her to be off antibiotics.   GAD-7 Results GAD-7 Generalized Anxiety Disorder Screening Tool 07/22/2021 06/30/2021 11/20/2019  1. Feeling Nervous, Anxious, or on Edge 1 1 1   2. Not Being Able to Stop or Control Worrying 2 0 1  3. Worrying Too Much About Different Things 1 1 1   4. Trouble Relaxing 1 0 1  5. Being So Restless it's Hard To Sit Still 1 1 0  6. Becoming Easily Annoyed or Irritable 0 0 0  7. Feeling Afraid As If Something Awful Might Happen 0 - 0  Total GAD-7 Score 6 - 4  Difficulty At Work, Home, or Getting   Along With Others? Not difficult at all Not difficult at all Not difficult at all    PHQ-9 Scores PHQ9 SCORE ONLY 06/30/2021 10/10/2020 03/18/2020  PHQ-9 Total Score 1 2 1     ---------------------------------------------------------------------------------------------------   Medications: Outpatient Medications Prior to Visit  Medication Sig   Ascorbic Acid (VITAMIN C) 1000 MG tablet Take 1,000 mg by mouth daily.   aspirin 81 MG tablet Take 81 mg by mouth daily.   Calcium Carb-Cholecalciferol 600-200 MG-UNIT TABS Take 1 tablet by mouth 2 (two) times daily.   Cholecalciferol (VITAMIN D PO) Take by mouth.   docusate sodium (COLACE) 100 MG capsule Take by mouth 2 (two) times daily.   hydrocortisone 2.5 % ointment    ketoconazole (NIZORAL) 2 % cream    loratadine (CLARITIN) 10 MG tablet Take 1 tablet (10 mg total) by mouth daily.   Omega-3 Fatty Acids (FISH OIL PO) Take 1 capsule by mouth daily.    polyethylene glycol (MIRALAX / GLYCOLAX) 17 g packet Take 17 g by mouth daily.   triamcinolone lotion (KENALOG) 0.1 % Apply 1 application topically daily as needed.   fluticasone (FLONASE) 50 MCG/ACT nasal spray Place 1 spray into both nostrils daily.   pantoprazole (PROTONIX) 40 MG tablet Take 1 tablet (40 mg total) by mouth daily.   sucralfate (CARAFATE) 1 g tablet Take 1 tablet (1 g total) by  mouth every 6 (six) hours as needed for up to 10 days.   [DISCONTINUED] sertraline (ZOLOFT) 50 MG tablet Take 1 tablet (50 mg total) by mouth daily. (Patient not taking: Reported on 07/22/2021)   No facility-administered medications prior to visit.    Review of Systems  Constitutional:  Negative for fatigue and fever.  HENT:  Positive for postnasal drip and rhinorrhea.   Respiratory:  Negative for cough and shortness of breath.   Cardiovascular:  Negative for chest pain and leg swelling.  Gastrointestinal:  Negative for abdominal pain.  Neurological:  Negative for dizziness and headaches.   Psychiatric/Behavioral:  The patient is nervous/anxious.      Objective    BP 139/63 (BP Location: Left Arm, Patient Position: Sitting, Cuff Size: Normal)    Pulse 65    Wt 125 lb 11.2 oz (57 kg)    SpO2 99%    BMI 24.55 kg/m  Blood pressure 139/63, pulse 65, weight 125 lb 11.2 oz (57 kg), SpO2 99 %.   Physical Exam Constitutional:      General: She is awake.     Appearance: She is well-developed.  HENT:     Head: Normocephalic.  Eyes:     Conjunctiva/sclera: Conjunctivae normal.  Cardiovascular:     Rate and Rhythm: Normal rate and regular rhythm.     Heart sounds: Normal heart sounds.  Pulmonary:     Effort: Pulmonary effort is normal.     Breath sounds: Normal breath sounds.  Skin:    General: Skin is warm.  Neurological:     Mental Status: She is alert and oriented to person, place, and time.  Psychiatric:        Attention and Perception: Attention normal.        Mood and Affect: Mood normal.        Speech: Speech normal.        Behavior: Behavior is cooperative.     Results for orders placed or performed in visit on 07/22/21  POCT urinalysis dipstick  Result Value Ref Range   Color, UA light yellow    Clarity, UA clear    Glucose, UA Negative Negative   Bilirubin, UA negative    Ketones, UA negative    Spec Grav, UA <=1.005 (A) 1.010 - 1.025   Blood, UA negative    pH, UA 6.0 5.0 - 8.0   Protein, UA Negative Negative   Urobilinogen, UA 0.2 0.2 or 1.0 E.U./dL   Nitrite, UA negative    Leukocytes, UA Moderate (2+) (A) Negative   Appearance normal    Odor normal     Assessment & Plan     Problem List Items Addressed This Visit       Respiratory   Allergic rhinitis    We can switch back to Zyrtec for better control.  Can use flonase BID        Genitourinary   Frequent UTI    Has appt with uro. UA again + leuks Will send culture but refrain from treating as uro prefers no abx use.  No suspicion of pyleonephritis at this time.      Relevant  Orders   Urine Culture     Other   Other hyperlipidemia    Will restart. Advised to either stay on or stop. She requests lipids checked today.       Relevant Medications   rosuvastatin (CRESTOR) 10 MG tablet   MDD (major depressive disorder), recurrent, in partial remission ([place]) -  Primary    History of several prior antidepressants with limited success.  Previously seen psychiatrist but has fallen of follow up.  Advised she return, will send a new referral as she hasnt been in quite a while.  We can go back to the buspar as she had no side effects with this, but I advised her to take it consistently, not just prn.  Take 10 mg daily for 1 week and then increase to 10 mg bid.  F/u 6 weeks      Relevant Medications   busPIRone (BUSPAR) 10 MG tablet   Other Relevant Orders   Ambulatory referral to Psychiatry   Other Visit Diagnoses     Dysuria       Relevant Orders   POCT urinalysis dipstick (Completed)   Urine Culture   Mixed hyperlipidemia       Relevant Medications   rosuvastatin (CRESTOR) 10 MG tablet   Other Relevant Orders   Lipid Profile   Comprehensive Metabolic Panel (CMET)   Anemia, unspecified type       Relevant Orders   CBC w/Diff/Platelet   Iron, TIBC and Ferritin Panel        Return in about 6 weeks (around 09/02/2021) for anxiety, depression.      I, Ann Kirschner, PA-C have reviewed all documentation for this visit. The documentation on  07/22/2021 for the exam, diagnosis, procedures, and orders are all accurate and complete.    Ann Kirschner, PA-C  [facility] [phone] (phone) [phone] (fax)  [place]

## 2021-07-22 ENCOUNTER — Encounter: Payer: Self-pay | Admitting: Physician Assistant

## 2021-07-22 ENCOUNTER — Other Ambulatory Visit: Payer: Self-pay

## 2021-07-22 ENCOUNTER — Ambulatory Visit (INDEPENDENT_AMBULATORY_CARE_PROVIDER_SITE_OTHER): Payer: Medicare Other | Admitting: Physician Assistant

## 2021-07-22 VITALS — BP 139/63 | HR 65 | Wt 125.7 lb

## 2021-07-22 DIAGNOSIS — J309 Allergic rhinitis, unspecified: Secondary | ICD-10-CM | POA: Diagnosis not present

## 2021-07-22 DIAGNOSIS — R300 Dysuria: Secondary | ICD-10-CM

## 2021-07-22 DIAGNOSIS — E7849 Other hyperlipidemia: Secondary | ICD-10-CM

## 2021-07-22 DIAGNOSIS — E782 Mixed hyperlipidemia: Secondary | ICD-10-CM | POA: Diagnosis not present

## 2021-07-22 DIAGNOSIS — D649 Anemia, unspecified: Secondary | ICD-10-CM

## 2021-07-22 DIAGNOSIS — F3341 Major depressive disorder, recurrent, in partial remission: Secondary | ICD-10-CM

## 2021-07-22 DIAGNOSIS — N390 Urinary tract infection, site not specified: Secondary | ICD-10-CM

## 2021-07-22 LAB — POCT URINALYSIS DIPSTICK
Appearance: NORMAL
Bilirubin, UA: NEGATIVE
Blood, UA: NEGATIVE
Glucose, UA: NEGATIVE
Ketones, UA: NEGATIVE
Nitrite, UA: NEGATIVE
Odor: NORMAL
Protein, UA: NEGATIVE
Spec Grav, UA: <=1.005 — AB (ref 1.010–1.025)
Urobilinogen, UA: 0.2 E.U./dL
pH, UA: 6 (ref 5.0–8.0)

## 2021-07-22 MED ORDER — ROSUVASTATIN CALCIUM 10 MG PO TABS: 10.0000 mg | ORAL_TABLET | Freq: Every day | ORAL | 1 refills | Status: DC

## 2021-07-22 MED ORDER — BUSPIRONE HCL 10 MG PO TABS: ORAL_TABLET | ORAL | 1 refills | Status: DC

## 2021-07-22 NOTE — Assessment & Plan Note (Signed)
Will restart. Advised to either stay on or stop. She requests lipids checked today.

## 2021-07-22 NOTE — Assessment & Plan Note (Signed)
We can switch back to Zyrtec for better control.  Can use flonase BID

## 2021-07-22 NOTE — Assessment & Plan Note (Signed)
History of several prior antidepressants with limited success.  Previously seen psychiatrist but has fallen of follow up.  Advised she return, will send a new referral as she hasnt been in quite a while.  We can go back to the buspar as she had no side effects with this, but I advised her to take it consistently, not just prn.  Take 10 mg daily for 1 week and then increase to 10 mg bid.  F/u 6 weeks

## 2021-07-22 NOTE — Assessment & Plan Note (Signed)
Has appt with uro. UA again + leuks Will send culture but refrain from treating as uro prefers no abx use.  No suspicion of pyleonephritis at this time.

## 2021-07-23 LAB — IRON,TIBC AND FERRITIN PANEL
Ferritin: 70 ng/mL (ref 15–150)
Iron Saturation: 25 % (ref 15–55)
Iron: 80 ug/dL (ref 27–139)
Total Iron Binding Capacity: 323 ug/dL (ref 250–450)
UIBC: 243 ug/dL (ref 118–369)

## 2021-07-23 LAB — COMPREHENSIVE METABOLIC PANEL
ALT: 13 IU/L (ref 0–32)
AST: 22 IU/L (ref 0–40)
Albumin/Globulin Ratio: 2.2 (ref 1.2–2.2)
Albumin: 4.7 g/dL (ref 3.7–4.7)
Alkaline Phosphatase: 55 IU/L (ref 44–121)
BUN/Creatinine Ratio: 17 (ref 12–28)
BUN: 14 mg/dL (ref 8–27)
Bilirubin Total: 0.4 mg/dL (ref 0.0–1.2)
CO2: 26 mmol/L (ref 20–29)
Calcium: 9.5 mg/dL (ref 8.7–10.3)
Chloride: 100 mmol/L (ref 96–106)
Creatinine, Ser: 0.82 mg/dL (ref 0.57–1.00)
Globulin, Total: 2.1 g/dL (ref 1.5–4.5)
Glucose: 95 mg/dL (ref 70–99)
Potassium: 3.5 mmol/L (ref 3.5–5.2)
Sodium: 141 mmol/L (ref 134–144)
Total Protein: 6.8 g/dL (ref 6.0–8.5)
eGFR: 73 mL/min/{1.73_m2} (ref >59)

## 2021-07-23 LAB — CBC WITH DIFFERENTIAL/PLATELET
Basophils Absolute: 0.1 10*3/uL (ref 0.0–0.2)
Basos: 1 %
EOS (ABSOLUTE): 0.1 10*3/uL (ref 0.0–0.4)
Eos: 1 %
Hematocrit: 37.8 % (ref 34.0–46.6)
Hemoglobin: 12.7 g/dL (ref 11.1–15.9)
Immature Grans (Abs): 0 10*3/uL (ref 0.0–0.1)
Immature Granulocytes: 0 %
Lymphocytes Absolute: 1.5 10*3/uL (ref 0.7–3.1)
Lymphs: 26 %
MCH: 31.8 pg (ref 26.6–33.0)
MCHC: 33.6 g/dL (ref 31.5–35.7)
MCV: 95 fL (ref 79–97)
Monocytes Absolute: 0.5 10*3/uL (ref 0.1–0.9)
Monocytes: 8 %
Neutrophils Absolute: 3.7 10*3/uL (ref 1.4–7.0)
Neutrophils: 64 %
Platelets: 159 10*3/uL (ref 150–450)
RBC: 4 x10E6/uL (ref 3.77–5.28)
RDW: 11.7 % (ref 11.7–15.4)
WBC: 5.9 10*3/uL (ref 3.4–10.8)

## 2021-07-23 LAB — LIPID PANEL
Chol/HDL Ratio: 3.9 ratio (ref 0.0–4.4)
Cholesterol, Total: 267 mg/dL — ABNORMAL HIGH (ref 100–199)
HDL: 68 mg/dL (ref >39)
LDL Chol Calc (NIH): 181 mg/dL — ABNORMAL HIGH (ref 0–99)
Triglycerides: 102 mg/dL (ref 0–149)
VLDL Cholesterol Cal: 18 mg/dL (ref 5–40)

## 2021-07-24 ENCOUNTER — Telehealth: Payer: Self-pay | Admitting: Physician Assistant

## 2021-07-24 LAB — URINE CULTURE

## 2021-07-24 NOTE — Telephone Encounter (Signed)
Pt returned call for lab results, all triage were assisting other patients. Instructed to place TE.

## 2021-07-24 NOTE — Telephone Encounter (Signed)
Pt given lab results per notes of Mikey Kirschner, PA-C on 07/23/21. Pt verbalized understanding.    Mikey Kirschner, PA-C  07/23/2021  9:21 PM EST     Cholesterol is elevated but she has not been taking statin, now that she is taking again, we can recheck in 4 months, so not at the next visit   Everything else looks great

## 2021-07-28 NOTE — Progress Notes (Signed)
07/29/21 9:38 AM   Ann Young Nov 17, 1940 [id number]  Referring provider:  Mikey Kirschner, PA-C [address] [place],  [place] [address] Chief Complaint  Patient presents with   Hematuria     HPI: SEBRENA ENGH is a 81 y.o.female who presents today for further evaluation of hematuria.   She has a personal history of nocturia and has a follow-up with gynecology to discuss her pessary.  She also has a personal history of frequent UTI.  She was seen by her PCP, Mikey Kirschner, PA-C, on 07/22/2021. Her urinalysis showed moderate leukocytes but was otherwise unremarkable. Urine culture grew Beta hemolytic Streptococcus, group B which she grew previously on 06/30/2021.   She has been struggling with her pessary. She reports that since using the pessary she has been having UTIs. She does not feel that she has an infection today. She was having burning and urinary frequency when she thought she had a UTI. She has been sore using the pessary and has bleeding when she removes it. She reports that she feels better without the pessary.  She was told that she needed the pessary because of her pelvic organ prolapse.  She did feel like her bladder was descending somewhat into her vagina but this did not bother her all that much.  She reports that she has small leakage when she laughs, coughs, and sneezes.   She reports that she has chronic constipation.   PMH: Past Medical History:  Diagnosis Date   Actinic keratosis    Anxiety    Arthritis    Asthma    Cancer ([place])    colorectal   Cataract cortical, senile, bilateral    Depression    Dyspepsia    Dyspepsia    Elevated cholesterol    Esophagitis    Gastritis    GERD (gastroesophageal reflux disease)    Helicobacter pylori (H. pylori) infection    Hemorrhoids    Hiatal hernia    History of hiatal hernia    Hx of adenomatous colonic polyps    Osteoporosis    Peptic ulcer disease    Peptic ulcer disease     Shingles    Squamous cell carcinoma of skin    R temple, SCC IS txted with LN2 and 5FU from [facility]    Surgical History: Past Surgical History:  Procedure Laterality Date   BREAST BIOPSY Left 2000   benign   BREAST CYST ASPIRATION     CARDIAC CATHETERIZATION  2015   WNL   CATARACT EXTRACTION Bilateral 2013   CATARACT EXTRACTION, BILATERAL Bilateral 2013   Dr. Willy Eddy   COLON SURGERY     COLONOSCOPY N/A 10/03/2019   Procedure: COLONOSCOPY;  Surgeon: Toledo, Benay Pike, MD;  Location: [facility] ENDOSCOPY;  Service: Gastroenterology;  Laterality: N/A;   COLONOSCOPY WITH PROPOFOL N/A 02/04/2016   Procedure: COLONOSCOPY WITH PROPOFOL;  Surgeon: Manya Silvas, MD;  Location: [facility] ENDOSCOPY;  Service: Endoscopy;  Laterality: N/A;   ESOPHAGOGASTRODUODENOSCOPY (EGD) WITH PROPOFOL N/A 02/04/2016   Procedure: ESOPHAGOGASTRODUODENOSCOPY (EGD) WITH PROPOFOL;  Surgeon: Manya Silvas, MD;  Location: [facility] ENDOSCOPY;  Service: Endoscopy;  Laterality: N/A;   ESOPHAGOGASTRODUODENOSCOPY (EGD) WITH PROPOFOL N/A 10/03/2019   Procedure: ESOPHAGOGASTRODUODENOSCOPY (EGD) WITH PROPOFOL;  Surgeon: Toledo, Benay Pike, MD;  Location: [facility] ENDOSCOPY;  Service: Gastroenterology;  Laterality: N/A;   HYSTEROSCOPY WITH D & C N/A 02/04/2017   Procedure: DILATATION AND CURETTAGE /HYSTEROSCOPY;  Surgeon: Schermerhorn, Gwen Her, MD;  Location: [facility] ORS;  Service: Gynecology;  Laterality: N/A;   resection of colorectal cancer without sequela  1999   resection of colorectal cancer without sequela     TUBAL LIGATION  1976    Home Medications:  Allergies as of 07/29/2021       Reactions   Clarithromycin Other (See Comments)   Questionable Biaxin vs Flagyl with chest discomfort & irregular heartbeat (most likely thought to be related to the Biaxin) Other reaction(s): Other (See Comments) Questionable Biaxin vs Flagyl with chest discomfort & irregular heartbeat (most likely thought to be related to the Biaxin) Questionable Biaxin  vs Flagyl with chest discomfort & irregular heartbeat (most likely thought to be related to the Biaxin)   Fesoterodine    Other reaction(s): Other (See Comments) weakness Other reaction(s): Other (See Comments) Other reaction(s): Other (See Comments) weakness weakness   Ibuprofen Other (See Comments)   To prevent swelling,increase hr/bp Other reaction(s): Other (See Comments) To prevent swelling,increase hr/b PATIENT STATES NOT ALLERGIC TO IBUPROFEN   Lamotrigine Other (See Comments)   Hair thinning Hair thinning Other reaction(s): Unknown Hair thinning Hair thinning   Metronidazole Other (See Comments)   Questionable Biaxin vs Flagyl with chest discomfort & irregular heartbeat (most likely thought to be related to the Biaxin) Other reaction(s): Other (See Comments), Other (See Comments) Questionable Biaxin vs Flagyl with chest discomfort & irregular heartbeat (most likely thought to be related to the Biaxin) Questionable Biaxin vs Flagyl with chest discomfort & irregular heartbeat (most likely thought to be related to the Biaxin)   Other Other (See Comments)   decongestants   Penicillins Nausea Only   Simvastatin Other (See Comments)   Hari Loss Other reaction(s): Other (See Comments), Other (See Comments) Hari Loss Hari Loss   Sulfa Antibiotics    Other reaction(s): Unknown Other reaction(s): Unknown Patient cant remember   Toviaz  [fesoterodine Fumarate Er] Other (See Comments)   weakness        Medication List        Accurate as of July 29, 2021  9:38 AM. If you have any questions, ask your nurse or doctor.          albuterol 108 (90 Base) MCG/ACT inhaler Commonly known as: VENTOLIN HFA SMARTSIG:2 Puff(s) By Mouth Every 6 Hours PRN   aspirin 81 MG tablet Take 81 mg by mouth daily.   busPIRone 10 MG tablet Commonly known as: BUSPAR Take 10 mg once daily for one week and then increase to twice daily in AM and PM   Calcium Carb-Cholecalciferol  600-200 MG-UNIT Tabs Take 1 tablet by mouth 2 (two) times daily.   docusate sodium 100 MG capsule Commonly known as: COLACE Take by mouth 2 (two) times daily.   FISH OIL PO Take 1 capsule by mouth daily.   fluticasone 50 MCG/ACT nasal spray Commonly known as: FLONASE Place 1 spray into both nostrils daily.   hydrocortisone 2.5 % ointment   ketoconazole 2 % cream Commonly known as: NIZORAL   loratadine 10 MG tablet Commonly known as: CLARITIN Take 1 tablet (10 mg total) by mouth daily.   pantoprazole 40 MG tablet Commonly known as: PROTONIX Take 1 tablet (40 mg total) by mouth daily.   polyethylene glycol 17 g packet Commonly known as: MIRALAX / GLYCOLAX Take 17 g by mouth daily.   rosuvastatin 10 MG tablet Commonly known as: CRESTOR Take 1 tablet (10 mg total) by mouth daily.   sucralfate 1 g tablet Commonly known as: CARAFATE Take 1 tablet (1 g total) by  mouth every 6 (six) hours as needed for up to 10 days.   triamcinolone lotion 0.1 % Commonly known as: KENALOG Apply 1 application topically daily as needed.   vitamin C 1000 MG tablet Take 1,000 mg by mouth daily.   VITAMIN D PO Take by mouth.        Allergies:  Allergies  Allergen Reactions   Clarithromycin Other (See Comments)    Questionable Biaxin vs Flagyl with chest discomfort & irregular heartbeat (most likely thought to be related to the Biaxin) Other reaction(s): Other (See Comments) Questionable Biaxin vs Flagyl with chest discomfort & irregular heartbeat (most likely thought to be related to the Biaxin) Questionable Biaxin vs Flagyl with chest discomfort & irregular heartbeat (most likely thought to be related to the Biaxin)   Fesoterodine     Other reaction(s): Other (See Comments) weakness Other reaction(s): Other (See Comments) Other reaction(s): Other (See Comments) weakness weakness   Ibuprofen Other (See Comments)    To prevent swelling,increase hr/bp Other reaction(s): Other  (See Comments) To prevent swelling,increase hr/b PATIENT STATES NOT ALLERGIC TO IBUPROFEN   Lamotrigine Other (See Comments)    Hair thinning Hair thinning Other reaction(s): Unknown Hair thinning Hair thinning    Metronidazole Other (See Comments)    Questionable Biaxin vs Flagyl with chest discomfort & irregular heartbeat (most likely thought to be related to the Biaxin) Other reaction(s): Other (See Comments), Other (See Comments) Questionable Biaxin vs Flagyl with chest discomfort & irregular heartbeat (most likely thought to be related to the Biaxin) Questionable Biaxin vs Flagyl with chest discomfort & irregular heartbeat (most likely thought to be related to the Biaxin)   Other Other (See Comments)    decongestants   Penicillins Nausea Only   Simvastatin Other (See Comments)    Hari Loss Other reaction(s): Other (See Comments), Other (See Comments) Hari Loss Hari Loss   Sulfa Antibiotics     Other reaction(s): Unknown Other reaction(s): Unknown Patient cant remember   Toviaz  [Fesoterodine Fumarate Er] Other (See Comments)    weakness    Family History: Family History  Problem Relation Age of Onset   Cirrhosis Sister    Colon polyps Sister    Leukemia Mother    Depression Mother    Heart disease Father    Depression Father    Bipolar disorder Daughter    Prostate cancer Neg Hx    Kidney cancer Neg Hx    Breast cancer Neg Hx     Social History:  reports that she has never smoked. She has never used smokeless tobacco. She reports that she does not drink alcohol and does not use drugs.   Physical Exam: BP 129/69    Pulse 76    Ht 5' (1.524 m)    Wt 123 lb (55.8 kg)    BMI 24.02 kg/m   Constitutional:  Alert and oriented, No acute distress. HEENT: [place] AT, moist mucus membranes.  Trachea midline, no masses. Cardiovascular: No clubbing, cyanosis, or edema. Respiratory: Normal respiratory effort, no increased work of breathing. Skin: No rashes, bruises or  suspicious lesions. Neurologic: Grossly intact, no focal deficits, moving all 4 extremities. Psychiatric: Normal mood and affect.  Laboratory Data: Lab Results  Component Value Date   CREATININE 0.82 07/22/2021   Urinalysis Unremarkable   She was catheterized today using standard sterile technique to obtain a urine specimen.  Assessment & Plan:   "Hematuria",  pelvic pain, possible UTIs, pelvic prolapse  - Likely vaginal bleeding from pessary.  She is following up with Gynecology for her pessary.  - Catheterized Urine today was negative for infection and blood  -Suspect her previous urinalyses were likely contaminants especially based on the associated urine culture which is vaginal flora - We discussed behavioral modifications such as stopping liquid intake 4 hours before bed for nocturia.   2. Chronic constipation  - Discussed how constipation can be a cause of her urinary symptoms. Recommend a bowel movement a day    I,Kailey Littlejohn,acting as a scribe for Hollice Espy, MD.,have documented all relevant documentation on the behalf of Hollice Espy, MD,as directed by  Hollice Espy, MD while in the presence of Hollice Espy, MD.  I have reviewed the above documentation for accuracy and completeness, and I agree with the above.   Hollice Espy, MD   [facility] [address] [place], [place] [address] [phone]  I spent 48 total minutes on the day of the encounter including pre-visit review of the medical record, face-to-face time with the patient, and post visit ordering of labs/imaging/tests.

## 2021-07-29 ENCOUNTER — Other Ambulatory Visit: Payer: Self-pay

## 2021-07-29 ENCOUNTER — Ambulatory Visit (INDEPENDENT_AMBULATORY_CARE_PROVIDER_SITE_OTHER): Payer: Medicare Other | Admitting: Urology

## 2021-07-29 VITALS — BP 129/69 | HR 76 | Ht 60.0 in | Wt 123.0 lb

## 2021-07-29 DIAGNOSIS — R319 Hematuria, unspecified: Secondary | ICD-10-CM | POA: Diagnosis not present

## 2021-07-29 DIAGNOSIS — R102 Pelvic and perineal pain: Secondary | ICD-10-CM

## 2021-07-29 LAB — URINALYSIS, COMPLETE
Bilirubin, UA: NEGATIVE
Glucose, UA: NEGATIVE
Ketones, UA: NEGATIVE
Leukocytes,UA: NEGATIVE
Nitrite, UA: NEGATIVE
Protein,UA: NEGATIVE
RBC, UA: NEGATIVE
Specific Gravity, UA: 1.02 (ref 1.005–1.030)
Urobilinogen, Ur: 0.2 mg/dL (ref 0.2–1.0)
pH, UA: 7 (ref 5.0–7.5)

## 2021-07-29 LAB — MICROSCOPIC EXAMINATION
Bacteria, UA: NONE SEEN
RBC, Urine: NONE SEEN /hpf (ref 0–2)

## 2021-08-10 ENCOUNTER — Ambulatory Visit: Payer: Medicare Other | Admitting: Physician Assistant

## 2021-09-02 ENCOUNTER — Ambulatory Visit: Payer: PRIVATE HEALTH INSURANCE | Admitting: Physician Assistant

## 2021-09-28 ENCOUNTER — Other Ambulatory Visit: Payer: Self-pay | Admitting: Infectious Diseases

## 2021-09-28 DIAGNOSIS — Z1231 Encounter for screening mammogram for malignant neoplasm of breast: Secondary | ICD-10-CM

## 2021-11-02 ENCOUNTER — Ambulatory Visit
Admission: RE | Admit: 2021-11-02 | Discharge: 2021-11-02 | Disposition: A | Discharge status: Home / Self Care | Payer: Medicare Other | Source: Ambulatory Visit | Attending: Infectious Diseases | Admitting: Infectious Diseases

## 2021-11-02 DIAGNOSIS — Z1231 Encounter for screening mammogram for malignant neoplasm of breast: Secondary | ICD-10-CM | POA: Diagnosis present

## 2021-12-22 ENCOUNTER — Ambulatory Visit (INDEPENDENT_AMBULATORY_CARE_PROVIDER_SITE_OTHER): Payer: Medicare Other | Admitting: Dermatology

## 2021-12-22 DIAGNOSIS — L578 Other skin changes due to chronic exposure to nonionizing radiation: Secondary | ICD-10-CM

## 2021-12-22 DIAGNOSIS — Z86007 Personal history of in-situ neoplasm of skin: Secondary | ICD-10-CM

## 2021-12-22 DIAGNOSIS — L820 Inflamed seborrheic keratosis: Secondary | ICD-10-CM | POA: Diagnosis not present

## 2021-12-22 DIAGNOSIS — L219 Seborrheic dermatitis, unspecified: Secondary | ICD-10-CM | POA: Diagnosis not present

## 2021-12-22 MED ORDER — HYDROCORTISONE 2.5 % EX CREA: TOPICAL_CREAM | CUTANEOUS | 0 refills | Status: DC

## 2021-12-22 NOTE — Patient Instructions (Addendum)
Cryotherapy Aftercare  Wash gently with soap and water everyday.   Apply Vaseline and Band-Aid daily until healed.     Due to recent changes in healthcare laws, you may see results of your pathology and/or laboratory studies on MyChart before the doctors have had a chance to review them. We understand that in some cases there may be results that are confusing or concerning to you. Please understand that not all results are received at the same time and often the doctors may need to interpret multiple results in order to provide you with the best plan of care or course of treatment. Therefore, we ask that you please give us 2 business days to thoroughly review all your results before contacting the office for clarification. Should we see a critical lab result, you will be contacted sooner.   If You Need Anything After Your Visit  If you have any questions or concerns for your doctor, please call our main line at [phone] and press option 4 to reach your doctor's medical assistant. If no one answers, please leave a voicemail as directed and we will return your call as soon as possible. Messages left after 4 pm will be answered the following business day.   You may also send us a message via MyChart. We typically respond to MyChart messages within 1-2 business days.  For prescription refills, please ask your pharmacy to contact our office. Our fax number is [phone].  If you have an urgent issue when the clinic is closed that cannot wait until the next business day, you can page your doctor at the number below.    Please note that while we do our best to be available for urgent issues outside of office hours, we are not available 24/7.   If you have an urgent issue and are unable to reach us, you may choose to seek medical care at your doctor's office, retail clinic, urgent care center, or emergency room.  If you have a medical emergency, please immediately call 911 or go to the  emergency department.  Pager Numbers  - Dr. Kowalski: [phone]  - Dr. Moye: [phone]  - Dr. Stewart: [phone]  In the event of inclement weather, please call our main line at [phone] for an update on the status of any delays or closures.  Dermatology Medication Tips: Please keep the boxes that topical medications come in in order to help keep track of the instructions about where and how to use these. Pharmacies typically print the medication instructions only on the boxes and not directly on the medication tubes.   If your medication is too expensive, please contact our office at [phone] option 4 or send us a message through MyChart.   We are unable to tell what your co-pay for medications will be in advance as this is different depending on your insurance coverage. However, we may be able to find a substitute medication at lower cost or fill out paperwork to get insurance to cover a needed medication.   If a prior authorization is required to get your medication covered by your insurance company, please allow us 1-2 business days to complete this process.  Drug prices often vary depending on where the prescription is filled and some pharmacies may offer cheaper prices.  The website www.goodrx.com contains coupons for medications through different pharmacies. The prices here do not account for what the cost may be with help from insurance (it may be cheaper with your insurance), but the website can   give you the price if you did not use any insurance.  - You can print the associated coupon and take it with your prescription to the pharmacy.  - You may also stop by our office during regular business hours and pick up a GoodRx coupon card.  - If you need your prescription sent electronically to a different pharmacy, notify our office through [facility] MyChart or by phone at [phone] option 4.     Si Usted Necesita Algo Despus de Su Visita  Tambin puede  enviarnos un mensaje a travs de MyChart. Por lo general respondemos a los mensajes de MyChart en el transcurso de 1 a 2 das hbiles.  Para renovar recetas, por favor pida a su farmacia que se ponga en contacto con nuestra oficina. Nuestro nmero de fax es el [phone].  Si tiene un asunto urgente cuando la clnica est cerrada y que no puede esperar hasta el siguiente da hbil, puede llamar/localizar a su doctor(a) al nmero que aparece a continuacin.   Por favor, tenga en cuenta que aunque hacemos todo lo posible para estar disponibles para asuntos urgentes fuera del horario de oficina, no estamos disponibles las 24 horas del da, los 7 das de la semana.   Si tiene un problema urgente y no puede comunicarse con nosotros, puede optar por buscar atencin mdica  en el consultorio de su doctor(a), en una clnica privada, en un centro de atencin urgente o en una sala de emergencias.  Si tiene una emergencia mdica, por favor llame inmediatamente al 911 o vaya a la sala de emergencias.  Nmeros de bper  - Dr. Kowalski: [phone]  - Dra. Moye: [phone]  - Dra. Stewart: [phone]  En caso de inclemencias del tiempo, por favor llame a nuestra lnea principal al [phone] para una actualizacin sobre el estado de cualquier retraso o cierre.  Consejos para la medicacin en dermatologa: Por favor, guarde las cajas en las que vienen los medicamentos de uso tpico para ayudarle a seguir las instrucciones sobre dnde y cmo usarlos. Las farmacias generalmente imprimen las instrucciones del medicamento slo en las cajas y no directamente en los tubos del medicamento.   Si su medicamento es muy caro, por favor, pngase en contacto con nuestra oficina llamando al [phone] y presione la opcin 4 o envenos un mensaje a travs de MyChart.   No podemos decirle cul ser su copago por los medicamentos por adelantado ya que esto es diferente dependiendo de la cobertura de su seguro.  Sin embargo, es posible que podamos encontrar un medicamento sustituto a menor costo o llenar un formulario para que el seguro cubra el medicamento que se considera necesario.   Si se requiere una autorizacin previa para que su compaa de seguros cubra su medicamento, por favor permtanos de 1 a 2 das hbiles para completar este proceso.  Los precios de los medicamentos varan con frecuencia dependiendo del lugar de dnde se surte la receta y alguna farmacias pueden ofrecer precios ms baratos.  El sitio web www.goodrx.com tiene cupones para medicamentos de diferentes farmacias. Los precios aqu no tienen en cuenta lo que podra costar con la ayuda del seguro (puede ser ms barato con su seguro), pero el sitio web puede darle el precio si no utiliz ningn seguro.  - Puede imprimir el cupn correspondiente y llevarlo con su receta a la farmacia.  - Tambin puede pasar por nuestra oficina durante el horario de atencin regular y recoger una tarjeta de cupones de GoodRx.  -   Si necesita que su receta se enve electrnicamente a una farmacia diferente, informe a nuestra oficina a travs de MyChart de [facility] o por telfono llamando al [phone] y presione la opcin 4.  

## 2021-12-22 NOTE — Progress Notes (Signed)
Follow-Up Visit   Subjective  Ann Young is a 81 y.o. female who presents for the following: Follow-up.  Patient here for 6 month follow-up, declines TBSE today. She has a few spots to check on the left cheek, left upper arm, and left ear. She picks at these areas. 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  Spot in L ear was frozen in past but didn't help. Doesn't itch.  She tends to pick/scratch at it.  The following portions of the chart were reviewed this encounter and updated as appropriate:       Review of Systems:  No other skin or systemic complaints except as noted in HPI or Assessment and Plan.  Objective  Well appearing patient in no apparent distress; mood and affect are within normal limits.  A focused examination was performed including face, arms. Relevant physical exam findings are noted in the Assessment and Plan.  L superior nasolabial fold x 1, L upper inner arm x 1 (2) 2.0 mm light tan firm papule face Waxy stuck-on papule with crusting on upper arm  Left Ear Pink scaliness with excoriation L upper ear antihelix area    Assessment & Plan  Actinic Damage - chronic, secondary to cumulative UV radiation exposure/sun exposure over time - diffuse scaly erythematous macules with underlying dyspigmentation - Recommend daily broad spectrum sunscreen SPF 30+ to sun-exposed areas, reapply every 2 hours as needed.  - Recommend staying in the shade or wearing long sleeves, sun glasses (UVA+UVB protection) and wide brim hats (4-inch brim around the entire circumference of the hat). - Call for new or changing lesions.  History of Squamous Cell Carcinoma in Situ of the Skin - No evidence of recurrence today of the right temple - Recommend regular full body skin exams - Recommend daily broad spectrum sunscreen SPF 30+ to sun-exposed areas, reapply every 2 hours as needed.  - Call if any new or changing lesions are noted between office  visits  Inflamed seborrheic keratosis (2) L superior nasolabial fold x 1, L upper inner arm x 1  vs Milia (L sup nasolabial fold)  Symptomatic, irritating, patient would like treated.   Destruction of lesion - L superior nasolabial fold x 1, L upper inner arm x 1  Destruction method: cryotherapy   Informed consent: discussed and consent obtained   Lesion destroyed using liquid nitrogen: Yes   Region frozen until ice ball extended beyond lesion: Yes   Outcome: patient tolerated procedure well with no complications   Post-procedure details: wound care instructions given   Additional details:  Prior to procedure, discussed risks of blister formation, small wound, skin dyspigmentation, or rare scar following cryotherapy. Recommend Vaseline ointment to treated areas while healing.   Seborrheic dermatitis Left Ear  Chronic and persistent condition with duration or expected duration over one year. Condition is bothersome/symptomatic for patient. Currently flared.   Seborrheic Dermatitis  -  is a chronic persistent rash characterized by pinkness and scaling most commonly of the mid face but also can occur on the scalp (dandruff), ears; mid chest, mid back and groin.  It tends to be exacerbated by stress and cooler weather.  People who have neurologic disease may experience new onset or exacerbation of existing seborrheic dermatitis.  The condition is not curable but treatable and can be controlled.  Start hydrocortisone 2.5% cream Apply to AA BID until improved dsp 28 g 0Rf. Avoid picking  hydrocortisone 2.5 % cream -  Left Ear Apply to affected area ear twice a day until improved.   Return in about 6 months (around 06/24/2022) for AKs, Hx SCC.  IJamesetta Orleans, CMA, am acting as scribe for Brendolyn Patty, MD .  Documentation: I have reviewed the above documentation for accuracy and completeness, and I agree with the above.  Brendolyn Patty MD

## 2022-02-10 ENCOUNTER — Other Ambulatory Visit: Payer: Self-pay | Admitting: Gastroenterology

## 2022-02-10 DIAGNOSIS — R1084 Generalized abdominal pain: Secondary | ICD-10-CM

## 2022-02-10 DIAGNOSIS — R194 Change in bowel habit: Secondary | ICD-10-CM

## 2022-02-10 DIAGNOSIS — R197 Diarrhea, unspecified: Secondary | ICD-10-CM

## 2022-02-17 ENCOUNTER — Ambulatory Visit
Admission: RE | Admit: 2022-02-17 | Discharge: 2022-02-17 | Disposition: A | Discharge status: Home / Self Care | Payer: Medicare Other | Source: Ambulatory Visit | Attending: Gastroenterology | Admitting: Gastroenterology

## 2022-02-17 DIAGNOSIS — R1084 Generalized abdominal pain: Secondary | ICD-10-CM | POA: Insufficient documentation

## 2022-02-17 DIAGNOSIS — R197 Diarrhea, unspecified: Secondary | ICD-10-CM | POA: Diagnosis present

## 2022-02-17 DIAGNOSIS — R194 Change in bowel habit: Secondary | ICD-10-CM | POA: Diagnosis present

## 2022-02-17 MED ORDER — IOHEXOL 300 MG/ML  SOLN
80.0000 mL | Freq: Once | INTRAMUSCULAR | Status: AC | PRN
  Administered 2022-02-17: 80 mL via INTRAVENOUS

## 2022-03-23 ENCOUNTER — Ambulatory Visit (INDEPENDENT_AMBULATORY_CARE_PROVIDER_SITE_OTHER): Payer: Medicare Other | Admitting: Urology

## 2022-03-23 ENCOUNTER — Encounter: Payer: Self-pay | Admitting: Urology

## 2022-03-23 VITALS — BP 123/77 | HR 82 | Ht 62.0 in | Wt 123.0 lb

## 2022-03-23 DIAGNOSIS — R3916 Straining to void: Secondary | ICD-10-CM | POA: Diagnosis not present

## 2022-03-23 DIAGNOSIS — R351 Nocturia: Secondary | ICD-10-CM | POA: Diagnosis not present

## 2022-03-23 DIAGNOSIS — R102 Pelvic and perineal pain: Secondary | ICD-10-CM

## 2022-03-23 DIAGNOSIS — R350 Frequency of micturition: Secondary | ICD-10-CM | POA: Diagnosis not present

## 2022-03-23 LAB — MICROSCOPIC EXAMINATION: Epithelial Cells (non renal): >10 /hpf — AB (ref 0–10)

## 2022-03-23 LAB — URINALYSIS, COMPLETE
Bilirubin, UA: NEGATIVE
Glucose, UA: NEGATIVE
Ketones, UA: NEGATIVE
Nitrite, UA: NEGATIVE
RBC, UA: NEGATIVE
Specific Gravity, UA: 1.02 (ref 1.005–1.030)
Urobilinogen, Ur: 0.2 mg/dL (ref 0.2–1.0)
pH, UA: 6 (ref 5.0–7.5)

## 2022-03-23 LAB — BLADDER SCAN AMB NON-IMAGING

## 2022-03-23 MED ORDER — MIRABEGRON ER 25 MG PO TB24: 25.0000 mg | ORAL_TABLET | Freq: Every day | ORAL | 0 refills | Status: DC

## 2022-03-23 NOTE — Progress Notes (Signed)
03/23/2022 2:52 PM   Ann Young [birth year] [id number]  Referring provider: Leonel Ramsay, MD [place],  [place] [address]  Chief Complaint  Patient presents with   Urinary Frequency    HPI: 81 year old female referred back for further evaluation of urinary frequency.  She was last seen by me in February for history of "recurrent UTI".  Catheterized urinalysis indicates that this was likely secondary to vaginal contaminants rather than a true infection.  We also discussed avoidance of beverages 4 hours before bedtime.  In the interim, she is no longer wearing a pessary.  She is working hard to find a pessary which he will have to replace and review.  This is upcoming.  She does have a personal history of chronic constipation.  She is back to being constipated back on MiraLAX.  Today, she reports urinary frequency but is most bothered by nocturia.  She gets up every 2 and half hours to urinate.  Patient reports that he was placed on Vesicare by Dr. Ouida Sills hard which was not effective and exacerbated her constipation.  She is not as bothered by her daytime symptoms.Please for free floor.  No known history of sleep apnea.  She also feels like sometimes she has to strain to empty her bladder.  She continues to have chronic pelvic pain, primarily in the left lower quadrant.  PVR 0    PMH: Past Medical History:  Diagnosis Date   Actinic keratosis    Anxiety    Arthritis    Asthma    Cancer ([place])    colorectal   Cataract cortical, senile, bilateral    Depression    Dyspepsia    Dyspepsia    Elevated cholesterol    Esophagitis    Gastritis    GERD (gastroesophageal reflux disease)    Helicobacter pylori (H. pylori) infection    Hemorrhoids    Hiatal hernia    History of hiatal hernia    Hx of adenomatous colonic polyps    Osteoporosis    Peptic ulcer disease    Peptic ulcer disease    Shingles    Squamous cell carcinoma of skin     R temple, SCC IS txted with LN2 and 5FU [facility]    Surgical History: Past Surgical History:  Procedure Laterality Date   BREAST BIOPSY Left 2000   benign   BREAST CYST ASPIRATION     CARDIAC CATHETERIZATION  2015   WNL   CATARACT EXTRACTION Bilateral 2013   CATARACT EXTRACTION, BILATERAL Bilateral 2013   Dr. Willy Eddy   COLON SURGERY     COLONOSCOPY N/A 10/03/2019   Procedure: COLONOSCOPY;  Surgeon: Toledo, Benay Pike, MD;  Location: [facility] ENDOSCOPY;  Service: Gastroenterology;  Laterality: N/A;   COLONOSCOPY WITH PROPOFOL N/A 02/04/2016   Procedure: COLONOSCOPY WITH PROPOFOL;  Surgeon: Manya Silvas, MD;  Location: [facility] ENDOSCOPY;  Service: Endoscopy;  Laterality: N/A;   ESOPHAGOGASTRODUODENOSCOPY (EGD) WITH PROPOFOL N/A 02/04/2016   Procedure: ESOPHAGOGASTRODUODENOSCOPY (EGD) WITH PROPOFOL;  Surgeon: Manya Silvas, MD;  Location: [facility] ENDOSCOPY;  Service: Endoscopy;  Laterality: N/A;   ESOPHAGOGASTRODUODENOSCOPY (EGD) WITH PROPOFOL N/A 10/03/2019   Procedure: ESOPHAGOGASTRODUODENOSCOPY (EGD) WITH PROPOFOL;  Surgeon: Toledo, Benay Pike, MD;  Location: [facility] ENDOSCOPY;  Service: Gastroenterology;  Laterality: N/A;   HYSTEROSCOPY WITH D & C N/A 02/04/2017   Procedure: DILATATION AND CURETTAGE /HYSTEROSCOPY;  Surgeon: Schermerhorn, Gwen Her, MD;  Location: [facility] ORS;  Service: Gynecology;  Laterality: N/A;   resection of  colorectal cancer without sequela  1999   resection of colorectal cancer without sequela     TUBAL LIGATION  1976    Home Medications:  Allergies as of 03/23/2022       Reactions   Clarithromycin Other (See Comments)   Questionable Biaxin vs Flagyl with chest discomfort & irregular heartbeat (most likely thought to be related to the Biaxin) Other reaction(s): Other (See Comments) Questionable Biaxin vs Flagyl with chest discomfort & irregular heartbeat (most likely thought to be related to the Biaxin) Questionable Biaxin vs Flagyl with chest discomfort & irregular  heartbeat (most likely thought to be related to the Biaxin)   Fesoterodine    Other reaction(s): Other (See Comments) weakness Other reaction(s): Other (See Comments) Other reaction(s): Other (See Comments) weakness weakness   Ibuprofen Other (See Comments)   To prevent swelling,increase hr/bp Other reaction(s): Other (See Comments) To prevent swelling,increase hr/b PATIENT STATES NOT ALLERGIC TO IBUPROFEN   Lamotrigine Other (See Comments)   Hair thinning Hair thinning Other reaction(s): Unknown Hair thinning Hair thinning   Metronidazole Other (See Comments)   Questionable Biaxin vs Flagyl with chest discomfort & irregular heartbeat (most likely thought to be related to the Biaxin) Other reaction(s): Other (See Comments), Other (See Comments) Questionable Biaxin vs Flagyl with chest discomfort & irregular heartbeat (most likely thought to be related to the Biaxin) Questionable Biaxin vs Flagyl with chest discomfort & irregular heartbeat (most likely thought to be related to the Biaxin)   Other Other (See Comments)   decongestants   Penicillins Nausea Only   Simvastatin Other (See Comments)   Hari Loss Other reaction(s): Other (See Comments), Other (See Comments) Hari Loss Hari Loss   Sulfa Antibiotics    Other reaction(s): Unknown Other reaction(s): Unknown Patient cant remember   Toviaz  [fesoterodine Fumarate Er] Other (See Comments)   weakness        Medication List        Accurate as of March 23, 2022  2:52 PM. If you have any questions, ask your nurse or doctor.          albuterol 108 (90 Base) MCG/ACT inhaler Commonly known as: VENTOLIN HFA SMARTSIG:2 Puff(s) By Mouth Every 6 Hours PRN   aspirin 81 MG tablet Take 81 mg by mouth daily.   busPIRone 10 MG tablet Commonly known as: BUSPAR Take 10 mg once daily for one week and then increase to twice daily in AM and PM   Calcium Carb-Cholecalciferol 600-200 MG-UNIT Tabs Take 1 tablet by mouth 2  (two) times daily.   cetirizine 10 MG tablet Commonly known as: ZYRTEC Take 10 mg by mouth daily.   docusate sodium 100 MG capsule Commonly known as: COLACE Take by mouth 2 (two) times daily.   FISH OIL PO Take 1 capsule by mouth daily.   fluticasone 50 MCG/ACT nasal spray Commonly known as: FLONASE Place 1 spray into both nostrils daily.   hydrocortisone 2.5 % ointment   hydrocortisone 2.5 % cream Apply to affected area ear twice a day until improved.   ketoconazole 2 % cream Commonly known as: NIZORAL   loratadine 10 MG tablet Commonly known as: CLARITIN Take 1 tablet (10 mg total) by mouth daily.   pantoprazole 40 MG tablet Commonly known as: PROTONIX Take 1 tablet (40 mg total) by mouth daily.   polyethylene glycol 17 g packet Commonly known as: MIRALAX / GLYCOLAX Take 17 g by mouth daily.   rosuvastatin 10 MG tablet Commonly known as: CRESTOR  Take 1 tablet (10 mg total) by mouth daily.   solifenacin 5 MG tablet Commonly known as: VESICARE Take by mouth.   sucralfate 1 g tablet Commonly known as: CARAFATE Take 1 tablet (1 g total) by mouth every 6 (six) hours as needed for up to 10 days.   triamcinolone lotion 0.1 % Commonly known as: KENALOG Apply 1 application topically daily as needed.   vitamin C 1000 MG tablet Take 1,000 mg by mouth daily.   VITAMIN D PO Take by mouth.        Allergies:  Allergies  Allergen Reactions   Clarithromycin Other (See Comments)    Questionable Biaxin vs Flagyl with chest discomfort & irregular heartbeat (most likely thought to be related to the Biaxin) Other reaction(s): Other (See Comments) Questionable Biaxin vs Flagyl with chest discomfort & irregular heartbeat (most likely thought to be related to the Biaxin) Questionable Biaxin vs Flagyl with chest discomfort & irregular heartbeat (most likely thought to be related to the Biaxin)   Fesoterodine     Other reaction(s): Other (See Comments) weakness Other  reaction(s): Other (See Comments) Other reaction(s): Other (See Comments) weakness weakness   Ibuprofen Other (See Comments)    To prevent swelling,increase hr/bp Other reaction(s): Other (See Comments) To prevent swelling,increase hr/b PATIENT STATES NOT ALLERGIC TO IBUPROFEN   Lamotrigine Other (See Comments)    Hair thinning Hair thinning Other reaction(s): Unknown Hair thinning Hair thinning    Metronidazole Other (See Comments)    Questionable Biaxin vs Flagyl with chest discomfort & irregular heartbeat (most likely thought to be related to the Biaxin) Other reaction(s): Other (See Comments), Other (See Comments) Questionable Biaxin vs Flagyl with chest discomfort & irregular heartbeat (most likely thought to be related to the Biaxin) Questionable Biaxin vs Flagyl with chest discomfort & irregular heartbeat (most likely thought to be related to the Biaxin)   Other Other (See Comments)    decongestants   Penicillins Nausea Only   Simvastatin Other (See Comments)    Hari Loss Other reaction(s): Other (See Comments), Other (See Comments) Hari Loss Hari Loss   Sulfa Antibiotics     Other reaction(s): Unknown Other reaction(s): Unknown Patient cant remember   Toviaz  [Fesoterodine Fumarate Er] Other (See Comments)    weakness    Family History: Family History  Problem Relation Age of Onset   Cirrhosis Sister    Colon polyps Sister    Leukemia Mother    Depression Mother    Heart disease Father    Depression Father    Bipolar disorder Daughter    Prostate cancer Neg Hx    Kidney cancer Neg Hx    Breast cancer Neg Hx     Social History:  reports that she has never smoked. She has never used smokeless tobacco. She reports that she does not drink alcohol and does not use drugs.   Physical Exam: There were no vitals taken for this visit.  Constitutional:  Alert and oriented, No acute distress. HEENT: [place] AT, moist mucus membranes.  Trachea midline, no  masses. Cardiovascular: No clubbing, cyanosis, or edema. Respiratory: Normal respiratory effort, no increased work of breathing. GI: Abdomen is soft, nontender, nondistended, no abdominal masses GU: No CVA tenderness Skin: No rashes, bruises or suspicious lesions. Neurologic: Grossly intact, no focal deficits, moving all 4 extremities. Psychiatric: Normal mood and affect.  Laboratory Data: Lab Results  Component Value Date   WBC 5.9 07/22/2021   HGB 12.7 07/22/2021   HCT 37.8  07/22/2021   MCV 95 07/22/2021   PLT 159 07/22/2021    Lab Results  Component Value Date   CREATININE 0.82 07/22/2021    Pertinent Imaging: Results for orders placed or performed in visit on 03/23/22  Microscopic Examination   Urine  Result Value Ref Range   WBC, UA 11-30 (A) 0 - 5 /hpf   RBC, Urine 0-2 0 - 2 /hpf   Epithelial Cells (non renal) >10 (A) 0 - 10 /hpf   Bacteria, UA Moderate (A) None seen/Few  Urinalysis, Complete  Result Value Ref Range   Specific Gravity, UA 1.020 1.005 - 1.030   pH, UA 6.0 5.0 - 7.5   Color, UA Yellow Yellow   Appearance Ur Clear Clear   Leukocytes,UA 1+ (A) Negative   Protein,UA Trace (A) Negative/Trace   Glucose, UA Negative Negative   Ketones, UA Negative Negative   RBC, UA Negative Negative   Bilirubin, UA Negative Negative   Urobilinogen, Ur 0.2 0.2 - 1.0 mg/dL   Nitrite, UA Negative Negative   Microscopic Examination See below:   Bladder Scan (Post Void Residual) in office  Result Value Ref Range   Scan Result 0ML      Assessment & Plan:    1. Nocturia We discussed behavioral modification again today, avoidance of irritating beverages and drinks before bedtime, at least 4 hours  Additions above, would like to have her discuss the possibility of obstructive sleep apnea with her primary care physician as her symptoms are mostly nighttime related and may be related to underlying OSA - Urinalysis, Complete - Bladder Scan (Post Void Residual) in  office  2. Urinary frequency Given her history of chronic constipation, which I believe from anticholinergics and she is already failed Vesicare  Ideally, she could do well with Gemtesa and/or Myrbetriq beta 3 agonist to avoid this issue.  Unfortunately, these are likely going to be cost prohibitive.  We may be able to apply for tier exception.  She was given samples of Myrbetriq today 25 mg.  She will call us and let us know if this is effective.  If it is, we can try to apply for financial assistance and/or exception.  - Urinalysis, Complete - Bladder Scan (Post Void Residual) in office  3. Urinary straining Likely related to prolapse; she is emptying and a PVR of 0  4. Pelvic pain in female Chronic, likely related constipation   Call with results of med trail  Hollice Espy, MD  [facility] [address] [place], [place] [address] [phone]

## 2022-03-23 NOTE — Patient Instructions (Signed)
Please contact the office and let us know how the Myrbetriq is working.

## 2022-06-01 ENCOUNTER — Encounter: Payer: Self-pay | Admitting: *Deleted

## 2022-06-01 ENCOUNTER — Ambulatory Visit
Admission: RE | Admit: 2022-06-01 | Discharge: 2022-06-01 | Disposition: A | Discharge status: Home / Self Care | Payer: Medicare Other | Source: Ambulatory Visit | Attending: Gastroenterology | Admitting: Gastroenterology

## 2022-06-01 ENCOUNTER — Other Ambulatory Visit: Payer: Self-pay

## 2022-06-01 ENCOUNTER — Encounter
Admission: RE | Disposition: A | Discharge status: Home / Self Care | Payer: Self-pay | Source: Ambulatory Visit | Attending: Gastroenterology

## 2022-06-01 ENCOUNTER — Ambulatory Visit: Payer: Medicare Other | Admitting: Registered Nurse

## 2022-06-01 DIAGNOSIS — F32A Depression, unspecified: Secondary | ICD-10-CM | POA: Diagnosis not present

## 2022-06-01 DIAGNOSIS — J45909 Unspecified asthma, uncomplicated: Secondary | ICD-10-CM | POA: Insufficient documentation

## 2022-06-01 DIAGNOSIS — F419 Anxiety disorder, unspecified: Secondary | ICD-10-CM | POA: Diagnosis not present

## 2022-06-01 DIAGNOSIS — K2100 Gastro-esophageal reflux disease with esophagitis, without bleeding: Secondary | ICD-10-CM | POA: Diagnosis not present

## 2022-06-01 DIAGNOSIS — Z8711 Personal history of peptic ulcer disease: Secondary | ICD-10-CM | POA: Insufficient documentation

## 2022-06-01 DIAGNOSIS — E7800 Pure hypercholesterolemia, unspecified: Secondary | ICD-10-CM | POA: Diagnosis not present

## 2022-06-01 DIAGNOSIS — Z79899 Other long term (current) drug therapy: Secondary | ICD-10-CM | POA: Diagnosis not present

## 2022-06-01 DIAGNOSIS — K2270 Barrett's esophagus without dysplasia: Secondary | ICD-10-CM | POA: Diagnosis not present

## 2022-06-01 DIAGNOSIS — Z86010 Personal history of colonic polyps: Secondary | ICD-10-CM | POA: Insufficient documentation

## 2022-06-01 DIAGNOSIS — K317 Polyp of stomach and duodenum: Secondary | ICD-10-CM | POA: Diagnosis not present

## 2022-06-01 DIAGNOSIS — M1990 Unspecified osteoarthritis, unspecified site: Secondary | ICD-10-CM | POA: Diagnosis not present

## 2022-06-01 DIAGNOSIS — K449 Diaphragmatic hernia without obstruction or gangrene: Secondary | ICD-10-CM | POA: Insufficient documentation

## 2022-06-01 DIAGNOSIS — E039 Hypothyroidism, unspecified: Secondary | ICD-10-CM | POA: Insufficient documentation

## 2022-06-01 DIAGNOSIS — Z85828 Personal history of other malignant neoplasm of skin: Secondary | ICD-10-CM | POA: Diagnosis not present

## 2022-06-01 HISTORY — PX: ESOPHAGOGASTRODUODENOSCOPY (EGD) WITH PROPOFOL: SHX5813

## 2022-06-01 SURGERY — ESOPHAGOGASTRODUODENOSCOPY (EGD) WITH PROPOFOL
Anesthesia: General

## 2022-06-01 MED ORDER — SODIUM CHLORIDE 0.9 % IV SOLN: INTRAVENOUS | Status: DC

## 2022-06-01 MED ORDER — PROPOFOL 10 MG/ML IV BOLUS
INTRAVENOUS | Status: DC | PRN
  Administered 2022-06-01: 80 mg via INTRAVENOUS
  Administered 2022-06-01: 30 mg via INTRAVENOUS

## 2022-06-01 MED ORDER — PROPOFOL 1000 MG/100ML IV EMUL
INTRAVENOUS | Status: AC
  Filled 2022-06-01: qty 100

## 2022-06-01 MED ORDER — GLYCOPYRROLATE 0.2 MG/ML IJ SOLN
INTRAMUSCULAR | Status: DC | PRN
  Administered 2022-06-01: .2 mg via INTRAVENOUS

## 2022-06-01 MED ORDER — LIDOCAINE HCL (CARDIAC) PF 100 MG/5ML IV SOSY
PREFILLED_SYRINGE | INTRAVENOUS | Status: DC | PRN
  Administered 2022-06-01: 60 mg via INTRAVENOUS

## 2022-06-01 NOTE — Interval H&P Note (Signed)
History and Physical Interval Note:  06/01/2022 10:44 AM  Ann Young  has presented today for surgery, with the diagnosis of GERD,.  The various methods of treatment have been discussed with the patient and family. After consideration of risks, benefits and other options for treatment, the patient has consented to  Procedure(s): ESOPHAGOGASTRODUODENOSCOPY (EGD) WITH PROPOFOL (N/A) as a surgical intervention.  The patient's history has been reviewed, patient examined, no change in status, stable for surgery.  I have reviewed the patient's chart and labs.  Questions were answered to the patient's satisfaction.     Lesly Rubenstein  Ok to proceed with EGD

## 2022-06-01 NOTE — Anesthesia Preprocedure Evaluation (Signed)
Anesthesia Evaluation  Patient identified by MRN, date of birth, ID band Patient awake    Reviewed: Allergy & Precautions, NPO status , Patient's Chart, lab work & pertinent test results  Airway Mallampati: II  TM Distance: >3 FB Neck ROM: full    Dental  (+) Chipped   Pulmonary asthma    Pulmonary exam normal        Cardiovascular + DOE  Normal cardiovascular exam     Neuro/Psych  PSYCHIATRIC DISORDERS Anxiety Depression     Neuromuscular disease    GI/Hepatic Neg liver ROS, hiatal hernia, PUD,GERD  ,,  Endo/Other  Hypothyroidism    Renal/GU negative Renal ROS  negative genitourinary   Musculoskeletal  (+) Arthritis ,    Abdominal   Peds  Hematology negative hematology ROS (+)   Anesthesia Other Findings Past Medical History: No date: Actinic keratosis No date: Anxiety No date: Arthritis No date: Asthma No date: Cancer [facility])     Comment:  colorectal No date: Cataract cortical, senile, bilateral No date: Depression No date: Dyspepsia No date: Dyspepsia No date: Elevated cholesterol No date: Esophagitis No date: Gastritis No date: GERD (gastroesophageal reflux disease) No date: Helicobacter pylori (H. pylori) infection No date: Hemorrhoids No date: Hiatal hernia No date: History of hiatal hernia No date: Hx of adenomatous colonic polyps No date: Osteoporosis No date: Peptic ulcer disease No date: Peptic ulcer disease No date: Shingles No date: Squamous cell carcinoma of skin     Comment:  R temple, SCC IS txted with LN2 and 5FU from [facility]  Past Surgical History: 2000: BREAST BIOPSY; Left     Comment:  benign No date: BREAST CYST ASPIRATION 2015: CARDIAC CATHETERIZATION     Comment:  WNL 2013: CATARACT EXTRACTION; Bilateral 2013: CATARACT EXTRACTION, BILATERAL; Bilateral     Comment:  Dr. Willy Eddy No date: COLON SURGERY 10/03/2019: COLONOSCOPY; N/A     Comment:  Procedure: COLONOSCOPY;  Surgeon:  Toledo, Benay Pike, MD;              Location: [facility] ENDOSCOPY;  Service: Gastroenterology;                Laterality: N/A; 02/04/2016: COLONOSCOPY WITH PROPOFOL; N/A     Comment:  Procedure: COLONOSCOPY WITH PROPOFOL;  Surgeon: Manya Silvas, MD;  Location: [facility] ENDOSCOPY;  Service:               Endoscopy;  Laterality: N/A; 02/04/2016: ESOPHAGOGASTRODUODENOSCOPY (EGD) WITH PROPOFOL; N/A     Comment:  Procedure: ESOPHAGOGASTRODUODENOSCOPY (EGD) WITH               PROPOFOL;  Surgeon: Manya Silvas, MD;  Location: [facility]              ENDOSCOPY;  Service: Endoscopy;  Laterality: N/A; 10/03/2019: ESOPHAGOGASTRODUODENOSCOPY (EGD) WITH PROPOFOL; N/A     Comment:  Procedure: ESOPHAGOGASTRODUODENOSCOPY (EGD) WITH               PROPOFOL;  Surgeon: Toledo, Benay Pike, MD;  Location:               [facility] ENDOSCOPY;  Service: Gastroenterology;  Laterality:               N/A; 02/04/2017: HYSTEROSCOPY WITH D & C; N/A     Comment:  Procedure: DILATATION AND CURETTAGE /HYSTEROSCOPY;                Surgeon: Schermerhorn,  Gwen Her, MD;  Location: [facility] ORS;              Service: Gynecology;  Laterality: N/A; 1999: resection of colorectal cancer without sequela No date: resection of colorectal cancer without sequela 1976: TUBAL LIGATION     Reproductive/Obstetrics negative OB ROS                              Anesthesia Physical Anesthesia Plan  ASA: 2  Anesthesia Plan: General   Post-op Pain Management:    Induction: Intravenous  PONV Risk Score and Plan: Propofol infusion and TIVA  Airway Management Planned: Natural Airway and Nasal Cannula  Additional Equipment:   Intra-op Plan:   Post-operative Plan:   Informed Consent: I have reviewed the patients History and Physical, chart, labs and discussed the procedure including the risks, benefits and alternatives for the proposed anesthesia with the patient or authorized representative who has indicated  his/her understanding and acceptance.     Dental Advisory Given  Plan Discussed with: Anesthesiologist, CRNA and Surgeon  Anesthesia Plan Comments: (Patient consented for risks of anesthesia including but not limited to:  - adverse reactions to medications - risk of airway placement if required - damage to eyes, teeth, lips or other oral mucosa - nerve damage due to positioning  - sore throat or hoarseness - Damage to heart, brain, nerves, lungs, other parts of body or loss of life  Patient voiced understanding.)         Anesthesia Quick Evaluation

## 2022-06-01 NOTE — Transfer of Care (Signed)
Immediate Anesthesia Transfer of Care Note  Patient: Ann Young  Procedure(s) Performed: ESOPHAGOGASTRODUODENOSCOPY (EGD) WITH PROPOFOL  Patient Location: PACU  Anesthesia Type:General  Level of Consciousness: drowsy  Airway & Oxygen Therapy: Patient Spontanous Breathing  Post-op Assessment: Report given to RN and Post -op Vital signs reviewed and stable  Post vital signs: Reviewed and stable  Last Vitals:  Vitals Value Taken Time  BP 106/55 06/01/22 1102  Temp 96   Pulse 77 06/01/22 1102  Resp 16 06/01/22 1102  SpO2 95 % 06/01/22 1102  Vitals shown include unvalidated device data.  Last Pain:  Vitals:   06/01/22 1101  TempSrc: Temporal  PainSc: Asleep         Complications: No notable events documented.

## 2022-06-01 NOTE — Op Note (Signed)
[facility] Gastroenterology Patient Name: Ann Young Procedure Date: 06/01/2022 10:48 AM MRN: [id number] Account #: [id number] Date of Birth: [birth year] Admit Type: Outpatient Age: 81 Room: [facility] ENDO ROOM 1 Gender: Female Note Status: Finalized Instrument Name: Altamese Cabal Endoscope [id number] Procedure:             Upper GI endoscopy Indications:           Gastro-esophageal reflux disease Providers:             Andrey Farmer MD, MD Referring MD:          Adrian Prows (Referring MD) Medicines:             Monitored Anesthesia Care Complications:         No immediate complications. Estimated blood loss:                         Minimal. Procedure:             Pre-Anesthesia Assessment:                        - Prior to the procedure, a History and Physical was                         performed, and patient medications and allergies were                         reviewed. The patient is competent. The risks and                         benefits of the procedure and the sedation options and                         risks were discussed with the patient. All questions                         were answered and informed consent was obtained.                         Patient identification and proposed procedure were                         verified by the physician, the nurse, the                         anesthesiologist, the anesthetist and the technician                         in the endoscopy suite. Mental Status Examination:                         alert and oriented. Airway Examination: normal                         oropharyngeal airway and neck mobility. Respiratory                         Examination: clear to auscultation. CV Examination:  normal. Prophylactic Antibiotics: The patient does not                         require prophylactic antibiotics. Prior                         Anticoagulants: The patient has taken no anticoagulant                          or antiplatelet agents. ASA Grade Assessment: II - A                         patient with mild systemic disease. After reviewing                         the risks and benefits, the patient was deemed in                         satisfactory condition to undergo the procedure. The                         anesthesia plan was to use monitored anesthesia care                         (MAC). Immediately prior to administration of                         medications, the patient was re-assessed for adequacy                         to receive sedatives. The heart rate, respiratory                         rate, oxygen saturations, blood pressure, adequacy of                         pulmonary ventilation, and response to care were                         monitored throughout the procedure. The physical                         status of the patient was re-assessed after the                         procedure.                        After obtaining informed consent, the endoscope was                         passed under direct vision. Throughout the procedure,                         the patient's blood pressure, pulse, and oxygen                         saturations were monitored continuously. The Endoscope  was introduced through the mouth, and advanced to the                         second part of duodenum. The upper GI endoscopy was                         accomplished without difficulty. The patient tolerated                         the procedure well. Findings:      Two tongues of salmon-colored mucosa were present. No other visible       abnormalities were present. The maximum longitudinal extent of these       esophageal mucosal changes was 1 cm in length. Biopsies were taken with       a cold forceps for histology. Estimated blood loss was minimal.      A small hiatal hernia was present.      Multiple 4 to 8 mm sessile fundic gland polyps with no  bleeding and no       stigmata of recent bleeding were found in the gastric fundus and in the       gastric body.      The examined duodenum was normal. 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 Biopsied.                        - Small hiatal hernia.                        - Multiple fundic gland polyps.                        - Normal examined duodenum. Recommendation:        - Discharge patient to home.                        - Resume previous diet.                        - Continue present medications.                        - Await pathology results.                        - Perform ambulatory pH and impedance monitoring if                         symptoms persist. Procedure Code(s):     --- Professional ---                        [phone], Esophagogastroduodenoscopy, flexible,                         transoral; with biopsy, single or multiple Diagnosis Code(s):     --- Professional ---  K22.70, Barrett's esophagus without dysplasia                        K44.9, Diaphragmatic hernia without obstruction or                         gangrene                        K31.7, Polyp of stomach and duodenum                        K21.9, Gastro-esophageal reflux disease without                         esophagitis CPT copyright 2022 American [employer]. All rights reserved. The codes documented in this report are preliminary and upon coder review may  be revised to meet current compliance requirements. Andrey Farmer MD, MD 06/01/2022 11:02:50 AM Number of Addenda: 0 Note Initiated On: 06/01/2022 10:48 AM Estimated Blood Loss:  Estimated blood loss was minimal.      [facility]

## 2022-06-01 NOTE — Anesthesia Postprocedure Evaluation (Signed)
Anesthesia Post Note  Patient: Ann Young  Procedure(s) Performed: ESOPHAGOGASTRODUODENOSCOPY (EGD) WITH PROPOFOL  Anesthesia Type: General Anesthetic complications: no   No notable events documented.   Last Vitals:  Vitals:   06/01/22 1117 06/01/22 1121  BP: 116/63 123/62  Pulse: 73 73  Resp: 14 19  Temp:    SpO2: 99% 100%    Last Pain:  Vitals:   06/01/22 1121  TempSrc:   PainSc: 0-No pain                 Alphonsus Sias

## 2022-06-01 NOTE — H&P (Signed)
Outpatient short stay form Pre-procedure 06/01/2022  Lesly Rubenstein, MD  Primary Physician: Leonel Ramsay, MD  Reason for visit:  GERD  History of present illness:    81 y/o lady with history of GERD, h pylori, rectal cancer, and anxiety here for EGD for GERD despite PPI BID. No blood thinners. No neck surgeries. No dysphagia. No family history of GI malignancies.    Current Facility-Administered Medications:    0.9 %  sodium chloride infusion, , Intravenous, Continuous, Gedalia Mcmillon, Hilton Cork, MD  Medications Prior to Admission  Medication Sig Dispense Refill Last Dose   aspirin 81 MG tablet Take 81 mg by mouth daily.   Past Week   busPIRone (BUSPAR) 10 MG tablet Take 10 mg once daily for one week and then increase to twice daily in AM and PM 90 tablet 1 Past Week   cetirizine (ZYRTEC) 10 MG tablet Take 10 mg by mouth daily.   05/31/2022   mirabegron ER (MYRBETRIQ) 25 MG TB24 tablet Take 1 tablet (25 mg total) by mouth daily. 30 tablet 0 05/31/2022   rosuvastatin (CRESTOR) 10 MG tablet Take 1 tablet (10 mg total) by mouth daily. 90 tablet 1 05/31/2022   albuterol (VENTOLIN HFA) 108 (90 Base) MCG/ACT inhaler SMARTSIG:2 Puff(s) By Mouth Every 6 Hours PRN      Ascorbic Acid (VITAMIN C) 1000 MG tablet Take 1,000 mg by mouth daily.      Calcium Carb-Cholecalciferol 600-200 MG-UNIT TABS Take 1 tablet by mouth 2 (two) times daily.      Cholecalciferol (VITAMIN D PO) Take by mouth.      docusate sodium (COLACE) 100 MG capsule Take by mouth 2 (two) times daily.      fluticasone (FLONASE) 50 MCG/ACT nasal spray Place 1 spray into both nostrils daily. 1 g 0    hydrocortisone 2.5 % cream Apply to affected area ear twice a day until improved. 28 g 0    hydrocortisone 2.5 % ointment       ketoconazole (NIZORAL) 2 % cream       loratadine (CLARITIN) 10 MG tablet Take 1 tablet (10 mg total) by mouth daily. 90 tablet 2    Omega-3 Fatty Acids (FISH OIL PO) Take 1 capsule by mouth daily.        pantoprazole (PROTONIX) 40 MG tablet Take 1 tablet (40 mg total) by mouth daily. 90 tablet 2    polyethylene glycol (MIRALAX / GLYCOLAX) 17 g packet Take 17 g by mouth daily.      solifenacin (VESICARE) 5 MG tablet Take by mouth.      sucralfate (CARAFATE) 1 g tablet Take 1 tablet (1 g total) by mouth every 6 (six) hours as needed for up to 10 days. 40 tablet 1    triamcinolone lotion (KENALOG) 0.1 % Apply 1 application topically daily as needed.        Allergies  Allergen Reactions   Clarithromycin Other (See Comments)    Questionable Biaxin vs Flagyl with chest discomfort & irregular heartbeat (most likely thought to be related to the Biaxin) Other reaction(s): Other (See Comments) Questionable Biaxin vs Flagyl with chest discomfort & irregular heartbeat (most likely thought to be related to the Biaxin) Questionable Biaxin vs Flagyl with chest discomfort & irregular heartbeat (most likely thought to be related to the Biaxin)   Fesoterodine     Other reaction(s): Other (See Comments) weakness Other reaction(s): Other (See Comments) Other reaction(s): Other (See Comments) weakness weakness   Ibuprofen Other (See  Comments)    To prevent swelling,increase hr/bp Other reaction(s): Other (See Comments) To prevent swelling,increase hr/b PATIENT STATES NOT ALLERGIC TO IBUPROFEN   Lamotrigine Other (See Comments)    Hair thinning Hair thinning Other reaction(s): Unknown Hair thinning Hair thinning    Metronidazole Other (See Comments)    Questionable Biaxin vs Flagyl with chest discomfort & irregular heartbeat (most likely thought to be related to the Biaxin) Other reaction(s): Other (See Comments), Other (See Comments) Questionable Biaxin vs Flagyl with chest discomfort & irregular heartbeat (most likely thought to be related to the Biaxin) Questionable Biaxin vs Flagyl with chest discomfort & irregular heartbeat (most likely thought to be related to the Biaxin)   Other Other (See  Comments)    decongestants   Penicillins Nausea Only   Simvastatin Other (See Comments)    Hari Loss Other reaction(s): Other (See Comments), Other (See Comments) Hari Loss Hari Loss   Sulfa Antibiotics     Other reaction(s): Unknown Other reaction(s): Unknown Patient cant remember   Toviaz  [Fesoterodine Fumarate Er] Other (See Comments)    weakness     Past Medical History:  Diagnosis Date   Actinic keratosis    Anxiety    Arthritis    Asthma    Cancer ([place])    colorectal   Cataract cortical, senile, bilateral    Depression    Dyspepsia    Dyspepsia    Elevated cholesterol    Esophagitis    Gastritis    GERD (gastroesophageal reflux disease)    Helicobacter pylori (H. pylori) infection    Hemorrhoids    Hiatal hernia    History of hiatal hernia    Hx of adenomatous colonic polyps    Osteoporosis    Peptic ulcer disease    Peptic ulcer disease    Shingles    Squamous cell carcinoma of skin    R temple, SCC IS txted with LN2 and 5FU from [facility]    Review of systems:  Otherwise negative.    Physical Exam  Gen: Alert, oriented. Appears stated age.  HEENT: PERRLA. Lungs: No respiratory distress CV: RRR Abd: soft, benign, no masses Ext: No edema    Planned procedures: Proceed with EGD. The patient understands the nature of the planned procedure, indications, risks, alternatives and potential complications including but not limited to bleeding, infection, perforation, damage to internal organs and possible oversedation/side effects from anesthesia. The patient agrees and gives consent to proceed.  Please refer to procedure notes for findings, recommendations and patient disposition/instructions.     Lesly Rubenstein, MD [facility] Gastroenterology

## 2022-06-02 ENCOUNTER — Encounter: Payer: Self-pay | Admitting: Gastroenterology

## 2022-06-02 LAB — SURGICAL PATHOLOGY

## 2022-07-12 ENCOUNTER — Other Ambulatory Visit: Payer: Self-pay | Admitting: Physician Assistant

## 2022-07-13 ENCOUNTER — Ambulatory Visit: Payer: Medicare Other | Admitting: Dermatology

## 2022-07-28 ENCOUNTER — Other Ambulatory Visit: Payer: Self-pay | Admitting: Dermatology

## 2022-07-28 ENCOUNTER — Ambulatory Visit: Payer: Medicare Other | Admitting: Dermatology

## 2022-07-28 VITALS — BP 130/73 | HR 68

## 2022-07-28 DIAGNOSIS — L570 Actinic keratosis: Secondary | ICD-10-CM

## 2022-07-28 DIAGNOSIS — D492 Neoplasm of unspecified behavior of bone, soft tissue, and skin: Secondary | ICD-10-CM

## 2022-07-28 DIAGNOSIS — L918 Other hypertrophic disorders of the skin: Secondary | ICD-10-CM

## 2022-07-28 DIAGNOSIS — D485 Neoplasm of uncertain behavior of skin: Secondary | ICD-10-CM

## 2022-07-28 DIAGNOSIS — L578 Other skin changes due to chronic exposure to nonionizing radiation: Secondary | ICD-10-CM

## 2022-07-28 NOTE — Patient Instructions (Addendum)
Wound Care Instructions  Cleanse wound gently with soap and water once a day then pat dry with clean gauze. Apply a thin coat of Petrolatum (petroleum jelly, "Vaseline") over the wound (unless you have an allergy to this). We recommend that you use a new, sterile tube of Vaseline. Do not pick or remove scabs. Do not remove the yellow or white "healing tissue" from the base of the wound.  Cover the wound with fresh, clean, nonstick gauze and secure with paper tape. You may use Band-Aids in place of gauze and tape if the wound is small enough, but would recommend trimming much of the tape off as there is often too much. Sometimes Band-Aids can irritate the skin.  You should call the office for your biopsy report after 1 week if you have not already been contacted.  If you experience any problems, such as abnormal amounts of bleeding, swelling, significant bruising, significant pain, or evidence of infection, please call the office immediately.  FOR ADULT SURGERY PATIENTS: If you need something for pain relief you may take 1 extra strength Tylenol (acetaminophen) AND 2 Ibuprofen (200mg each) together every 4 hours as needed for pain. (do not take these if you are allergic to them or if you have a reason you should not take them.) Typically, you may only need pain medication for 1 to 3 days.     Cryotherapy Aftercare  Wash gently with soap and water everyday.   Apply Vaseline and Band-Aid daily until healed.    Due to recent changes in healthcare laws, you may see results of your pathology and/or laboratory studies on MyChart before the doctors have had a chance to review them. We understand that in some cases there may be results that are confusing or concerning to you. Please understand that not all results are received at the same time and often the doctors may need to interpret multiple results in order to provide you with the best plan of care or course of treatment. Therefore, we ask that you  please give us 2 business days to thoroughly review all your results before contacting the office for clarification. Should we see a critical lab result, you will be contacted sooner.   If You Need Anything After Your Visit  If you have any questions or concerns for your doctor, please call our main line at [phone] and press option 4 to reach your doctor's medical assistant. If no one answers, please leave a voicemail as directed and we will return your call as soon as possible. Messages left after 4 pm will be answered the following business day.   You may also send us a message via MyChart. We typically respond to MyChart messages within 1-2 business days.  For prescription refills, please ask your pharmacy to contact our office. Our fax number is [phone].  If you have an urgent issue when the clinic is closed that cannot wait until the next business day, you can page your doctor at the number below.    Please note that while we do our best to be available for urgent issues outside of office hours, we are not available 24/7.   If you have an urgent issue and are unable to reach us, you may choose to seek medical care at your doctor's office, retail clinic, urgent care center, or emergency room.  If you have a medical emergency, please immediately call 911 or go to the emergency department.  Pager Numbers  - Dr. Kowalski: [phone]  -   Dr. Moye: [phone]  - Dr. Stewart: [phone]  In the event of inclement weather, please call our main line at [phone] for an update on the status of any delays or closures.  Dermatology Medication Tips: Please keep the boxes that topical medications come in in order to help keep track of the instructions about where and how to use these. Pharmacies typically print the medication instructions only on the boxes and not directly on the medication tubes.   If your medication is too expensive, please contact our office at  [phone] option 4 or send us a message through MyChart.   We are unable to tell what your co-pay for medications will be in advance as this is different depending on your insurance coverage. However, we may be able to find a substitute medication at lower cost or fill out paperwork to get insurance to cover a needed medication.   If a prior authorization is required to get your medication covered by your insurance company, please allow us 1-2 business days to complete this process.  Drug prices often vary depending on where the prescription is filled and some pharmacies may offer cheaper prices.  The website www.goodrx.com contains coupons for medications through different pharmacies. The prices here do not account for what the cost may be with help from insurance (it may be cheaper with your insurance), but the website can give you the price if you did not use any insurance.  - You can print the associated coupon and take it with your prescription to the pharmacy.  - You may also stop by our office during regular business hours and pick up a GoodRx coupon card.  - If you need your prescription sent electronically to a different pharmacy, notify our office through [facility] MyChart or by phone at [phone] option 4.     Si Usted Necesita Algo Despus de Su Visita  Tambin puede enviarnos un mensaje a travs de MyChart. Por lo general respondemos a los mensajes de MyChart en el transcurso de 1 a 2 das hbiles.  Para renovar recetas, por favor pida a su farmacia que se ponga en contacto con nuestra oficina. Nuestro nmero de fax es el [phone].  Si tiene un asunto urgente cuando la clnica est cerrada y que no puede esperar hasta el siguiente da hbil, puede llamar/localizar a su doctor(a) al nmero que aparece a continuacin.   Por favor, tenga en cuenta que aunque hacemos todo lo posible para estar disponibles para asuntos urgentes fuera del horario de oficina, no estamos  disponibles las 24 horas del da, los 7 das de la semana.   Si tiene un problema urgente y no puede comunicarse con nosotros, puede optar por buscar atencin mdica  en el consultorio de su doctor(a), en una clnica privada, en un centro de atencin urgente o en una sala de emergencias.  Si tiene una emergencia mdica, por favor llame inmediatamente al 911 o vaya a la sala de emergencias.  Nmeros de bper  - Dr. Kowalski: [phone]  - Dra. Moye: [phone]  - Dra. Stewart: [phone]  En caso de inclemencias del tiempo, por favor llame a nuestra lnea principal al [phone] para una actualizacin sobre el estado de cualquier retraso o cierre.  Consejos para la medicacin en dermatologa: Por favor, guarde las cajas en las que vienen los medicamentos de uso tpico para ayudarle a seguir las instrucciones sobre dnde y cmo usarlos. Las farmacias generalmente imprimen las instrucciones del medicamento slo en las cajas y   no directamente en los tubos del medicamento.   Si su medicamento es muy caro, por favor, pngase en contacto con nuestra oficina llamando al [phone] y presione la opcin 4 o envenos un mensaje a travs de MyChart.   No podemos decirle cul ser su copago por los medicamentos por adelantado ya que esto es diferente dependiendo de la cobertura de su seguro. Sin embargo, es posible que podamos encontrar un medicamento sustituto a menor costo o llenar un formulario para que el seguro cubra el medicamento que se considera necesario.   Si se requiere una autorizacin previa para que su compaa de seguros cubra su medicamento, por favor permtanos de 1 a 2 das hbiles para completar este proceso.  Los precios de los medicamentos varan con frecuencia dependiendo del lugar de dnde se surte la receta y alguna farmacias pueden ofrecer precios ms baratos.  El sitio web www.goodrx.com tiene cupones para medicamentos de diferentes farmacias. Los precios aqu no  tienen en cuenta lo que podra costar con la ayuda del seguro (puede ser ms barato con su seguro), pero el sitio web puede darle el precio si no utiliz ningn seguro.  - Puede imprimir el cupn correspondiente y llevarlo con su receta a la farmacia.  - Tambin puede pasar por nuestra oficina durante el horario de atencin regular y recoger una tarjeta de cupones de GoodRx.  - Si necesita que su receta se enve electrnicamente a una farmacia diferente, informe a nuestra oficina a travs de MyChart de [facility] o por telfono llamando al [phone] y presione la opcin 4.  

## 2022-07-28 NOTE — Progress Notes (Signed)
   Follow-Up Visit   Subjective  Ann Young is a 82 y.o. female who presents for the following: scaly spot (L nose, 66m ) and check growth (L nose, not sure how long it has been there/L neck, feels a growth once in a while). Scaly spot next to L nose.   The following portions of the chart were reviewed this encounter and updated as appropriate:       Review of Systems:  No other skin or systemic complaints except as noted in HPI or Assessment and Plan.  Objective  Well appearing patient in no apparent distress; mood and affect are within normal limits.  A focused examination was performed including face, neck. Relevant physical exam findings are noted in the Assessment and Plan.  L ant alar crease 4.053mfirm lobular flesh white flat pap     L paranasal x 2 (2) Pink scaly macules    Assessment & Plan   Acrochordons (Skin Tags) - Fleshy, skin-colored pedunculated papules neck - Benign appearing.  - Observe. - If desired, they can be removed with an in office procedure that is not covered by insurance. - Please call the clinic if you notice any new or changing lesions.  - L post neck  Actinic Damage - chronic, secondary to cumulative UV radiation exposure/sun exposure over time - diffuse scaly erythematous macules with underlying dyspigmentation - Recommend daily broad spectrum sunscreen SPF 30+ to sun-exposed areas, reapply every 2 hours as needed.  - Recommend staying in the shade or wearing long sleeves, sun glasses (UVA+UVB protection) and wide brim hats (4-inch brim around the entire circumference of the hat). - Call for new or changing lesions.   Neoplasm of skin L ant alar crease  Epidermal / dermal shaving  Lesion diameter (cm):  0.4 Informed consent: discussed and consent obtained   Patient was prepped and draped in usual sterile fashion: area prepped with alcohol. Anesthesia: the lesion was anesthetized in a standard fashion   Anesthetic:  1%  lidocaine w/ epinephrine 1-100,000 buffered w/ 8.4% NaHCO3 Instrument used: DermaBlade   Hemostasis achieved with: pressure, aluminum chloride and electrodesiccation   Outcome: patient tolerated procedure well   Post-procedure details: wound care instructions given   Post-procedure details comment:  Ointment and small bandage applied  Specimen 1 - Surgical pathology Differential Diagnosis: D48.5 Sebaceous Hyperplasia vs Nodular Elastosis r/o BCC  Check Margins: yes 4.17m58mirm lobular flesh white flat pap  AK (actinic keratosis) (2) L paranasal x 2  Destruction of lesion - L paranasal x 2  Destruction method: cryotherapy   Informed consent: discussed and consent obtained   Lesion destroyed using liquid nitrogen: Yes   Region frozen until ice ball extended beyond lesion: Yes   Outcome: patient tolerated procedure well with no complications   Post-procedure details: wound care instructions given   Additional details:  Prior to procedure, discussed risks of blister formation, small wound, skin dyspigmentation, or rare scar following cryotherapy. Recommend Vaseline ointment to treated areas while healing.    Return in about 3 months (around 10/26/2022) for AK f/u, bx fu.  I, Sonya Hupman, RMA, am acting as scribe for TarBrendolyn PattyD .  Documentation: I have reviewed the above documentation for accuracy and completeness, and I agree with the above.  TarBrendolyn Patty

## 2022-08-03 ENCOUNTER — Telehealth: Payer: Self-pay

## 2022-08-03 NOTE — Telephone Encounter (Signed)
Advised pt of bx results/sh ?

## 2022-08-03 NOTE — Telephone Encounter (Signed)
-----   Message from Brendolyn Patty, MD sent at 08/03/2022  5:44 PM EST ----- Skin , left ant alar crease NODULAR SOLAR ELASTOSIS, BASE INVOLVED  Benign growth seen in sun-damaged skin, no further treatment needed - please call patient

## 2022-09-29 ENCOUNTER — Other Ambulatory Visit: Payer: Self-pay | Admitting: Infectious Diseases

## 2022-09-29 DIAGNOSIS — Z1231 Encounter for screening mammogram for malignant neoplasm of breast: Secondary | ICD-10-CM

## 2022-11-02 ENCOUNTER — Ambulatory Visit: Payer: Medicare Other | Admitting: Dermatology

## 2022-11-02 ENCOUNTER — Other Ambulatory Visit: Payer: Self-pay | Admitting: Dermatology

## 2022-11-02 VITALS — BP 108/57 | HR 63

## 2022-11-02 DIAGNOSIS — L659 Nonscarring hair loss, unspecified: Secondary | ICD-10-CM

## 2022-11-02 DIAGNOSIS — W908XXA Exposure to other nonionizing radiation, initial encounter: Secondary | ICD-10-CM | POA: Diagnosis not present

## 2022-11-02 DIAGNOSIS — L578 Other skin changes due to chronic exposure to nonionizing radiation: Secondary | ICD-10-CM | POA: Diagnosis not present

## 2022-11-02 DIAGNOSIS — L820 Inflamed seborrheic keratosis: Secondary | ICD-10-CM | POA: Diagnosis not present

## 2022-11-02 DIAGNOSIS — L650 Telogen effluvium: Secondary | ICD-10-CM

## 2022-11-02 DIAGNOSIS — X32XXXA Exposure to sunlight, initial encounter: Secondary | ICD-10-CM | POA: Diagnosis not present

## 2022-11-02 DIAGNOSIS — E559 Vitamin D deficiency, unspecified: Secondary | ICD-10-CM

## 2022-11-02 MED ORDER — MINOXIDIL 2.5 MG PO TABS: ORAL_TABLET | ORAL | 2 refills | Status: DC

## 2022-11-02 NOTE — Progress Notes (Signed)
Follow-Up Visit   Subjective  Ann Young is a 82 y.o. 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 She has a few spots to check on the back, scalp, and left ear that are irritated. She also has hair loss that started 4-6 months ago. She has daily stress and depression, but no history of recent illness or surgery. Pt's PCP took her off Abilify and rosuvastatin to see if hair loss improved, but she is still having problems. She had hair loss in the past when on simvastatin.  The following portions of the chart were reviewed this encounter and updated as appropriate: medications, allergies, medical history  Review of Systems:  No other skin or systemic complaints except as noted in HPI or Assessment and Plan.  Objective  Well appearing patient in no apparent distress; mood and affect are within normal limits.  A focused examination was performed of the following areas: Face, scalp, back Relevant physical exam findings are noted in the Assessment and Plan.  L ant alar crease Well healed biopsy site.  Left Ear; R post shoulder x 1; occipital scalp hairline x 1 (3) Waxy flesh papule with scaling of the occipital scalp hairline, L ear crura, R post shoulder.           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  Benign. Observation.   Inflamed seborrheic keratosis (3) Left Ear; R post shoulder x 1; occipital scalp hairline x 1  Symptomatic, irritating, patient would like treated.  Avoid picking  Destruction of lesion - Left Ear; R post shoulder x 1; occipital scalp hairline x 1  Destruction method: cryotherapy   Informed consent: discussed and consent obtained   Lesion destroyed using liquid nitrogen: Yes   Region frozen until ice ball extended beyond lesion: Yes   Outcome: patient tolerated procedure  well with no complications   Post-procedure details: wound care instructions given   Additional details:  Prior to procedure, discussed risks of blister formation, small wound, skin dyspigmentation, or rare scar following cryotherapy. Recommend Vaseline ointment to treated areas while healing.   Vitamin D deficiency  Related Procedures Vitamin D, 25-hydroxy  Alopecia  Related Procedures Ferritin  ACTINIC DAMAGE - chronic, secondary to cumulative UV radiation exposure/sun exposure over time - diffuse scaly erythematous macules with underlying dyspigmentation - Recommend daily broad spectrum sunscreen SPF 30+ to sun-exposed areas, reapply every 2 hours as needed.  - Recommend staying in the shade or wearing long sleeves, sun glasses (UVA+UVB protection) and wide brim hats (4-inch brim around the entire circumference of the hat). - Call for new or changing lesions.  TELOGEN EFFLUVIUM with underlying ANDROGENETIC ALOPECIA  Exam: Diffuse thinning of hair, positive hair pull test.  Increased thinning at BL temporal/frontal scalp.  See photos  Chronic and persistent condition with duration or expected duration over one year. Condition is bothersome/symptomatic for patient. Currently flared.  Possibly due to statin medication, since had similar problem on another statin med years ago.  Telogen effluvium is a benign, self-limited condition causing increased hair shedding usually for several months. It does not progress to baldness, and the hair eventually grows back on its own. It can be triggered by recent illness, recent surgery, thyroid disease, low iron stores, vitamin D deficiency, fad diets or rapid weight loss, hormonal changes such as pregnancy or birth control  pills, and some medication. Usually the hair loss starts 2-3 months after the illness or health change. Rarely, it can continue for longer than a year. Treatments options may include oral or topical Minoxidil; Red Light scalp  treatments; Biotin 2.5 mg daily and other options.  Treatment Plan: Thyroid recently checked and was normal Start minoxidil 2.5 MG take 1/2 tablet po QD x 4 weeks, if no symptoms may increase to 1 full tablet. Doses of minoxidil for hair loss are considered 'low dose'. This is because the doses used for hair loss are much lower than the doses which are used for conditions such as high blood pressure (hypertension). The doses used for hypertension are 10-40mg  per day.  Side effects are uncommon at the low doses (up to 2.5 mg/day) used to treat hair loss. Potential side effects, more commonly seen at higher doses, include: Increase in hair growth (hypertrichosis) elsewhere on face and body Temporary hair shedding upon starting medication which may last up to 4 weeks Ankle swelling, fluid retention, rapid weight gain more than 5 pounds Low blood pressure and feeling lightheaded or dizzy when standing up quickly Fast or irregular heartbeat Headaches   Labs ordered today - Ferritin, Vit D. TSH, CBC - wnl 10/21/2022   Return 3-4 MONTHS, for Alopecia.  ICherlyn Labella, CMA, am acting as scribe for Willeen Niece, MD .   Documentation: I have reviewed the above documentation for accuracy and completeness, and I agree with the above.  Willeen Niece, MD

## 2022-11-02 NOTE — Patient Instructions (Addendum)
Cryotherapy Aftercare  Wash gently with soap and water everyday.   Apply Vaseline and Band-Aid daily until healed.   Start minoxidil 2.5 MG take 1/2 tablet by mouth daily for 4 weeks. If no side effects, may increase to 1 full tablet daily.  Doses of minoxidil for hair loss are considered 'low dose'. This is because the doses used for hair loss are much lower than the doses which are used for conditions such as high blood pressure (hypertension). The doses used for hypertension are 10-40mg  per day.  Side effects are uncommon at the low doses (up to 2.5 mg/day) used to treat hair loss. Potential side effects, more commonly seen at higher doses, include: Increase in hair growth (hypertrichosis) elsewhere on face and body Temporary hair shedding upon starting medication which may last up to 4 weeks Ankle swelling, fluid retention, rapid weight gain more than 5 pounds Low blood pressure and feeling lightheaded or dizzy when standing up quickly Fast or irregular heartbeat Headaches   Due to recent changes in healthcare laws, you may see results of your pathology and/or laboratory studies on MyChart before the doctors have had a chance to review them. We understand that in some cases there may be results that are confusing or concerning to you. Please understand that not all results are received at the same time and often the doctors may need to interpret multiple results in order to provide you with the best plan of care or course of treatment. Therefore, we ask that you please give [place] 2 business days to thoroughly review all your results before contacting the office for clarification. Should we see a critical lab result, you will be contacted sooner.   If You Need Anything After Your Visit  If you have any questions or concerns for your doctor, please call our main line at [phone] and press option 4 to reach your doctor's medical assistant. If no one answers, please leave a voicemail as  directed and we will return your call as soon as possible. Messages left after 4 pm will be answered the following business day.   You may also send [place] a message via MyChart. We typically respond to MyChart messages within 1-2 business days.  For prescription refills, please ask your pharmacy to contact our office. Our fax number is [phone].  If you have an urgent issue when the clinic is closed that cannot wait until the next business day, you can page your doctor at the number below.    Please note that while we do our best to be available for urgent issues outside of office hours, we are not available 24/7.   If you have an urgent issue and are unable to reach [place], you may choose to seek medical care at your doctor's office, retail clinic, urgent care center, or emergency room.  If you have a medical emergency, please immediately call 911 or go to the emergency department.  Pager Numbers  - Dr. Gwen Pounds: [phone]  - Dr. Neale Burly: [phone]  - Dr. Roseanne Reno: [phone]  In the event of inclement weather, please call our main line at [phone] for an update on the status of any delays or closures.  Dermatology Medication Tips: Please keep the boxes that topical medications come in in order to help keep track of the instructions about where and how to use these. Pharmacies typically print the medication instructions only on the boxes and not directly on the medication tubes.   If your medication is too expensive,  please contact our office at [phone] option 4 or send [place] a message through MyChart.   We are unable to tell what your co-pay for medications will be in advance as this is different depending on your insurance coverage. However, we may be able to find a substitute medication at lower cost or fill out paperwork to get insurance to cover a needed medication.   If a prior authorization is required to get your medication covered by your insurance company, please  allow [place] 1-2 business days to complete this process.  Drug prices often vary depending on where the prescription is filled and some pharmacies may offer cheaper prices.  The website www.goodrx.com contains coupons for medications through different pharmacies. The prices here do not account for what the cost may be with help from insurance (it may be cheaper with your insurance), but the website can give you the price if you did not use any insurance.  - You can print the associated coupon and take it with your prescription to the pharmacy.  - You may also stop by our office during regular business hours and pick up a GoodRx coupon card.  - If you need your prescription sent electronically to a different pharmacy, notify our office through [facility] or by phone at [phone] option 4.     Si Usted Necesita Algo Despus de Su Visita  Tambin puede enviarnos un mensaje a travs de Clinical cytogeneticist. Por lo general respondemos a los mensajes de MyChart en el transcurso de 1 a 2 das hbiles.  Para renovar recetas, por favor pida a su farmacia que se ponga en contacto con nuestra oficina. Annie Sable de fax es [place] [phone].  Si tiene un asunto urgente cuando la clnica est cerrada y que no puede esperar hasta el siguiente da hbil, puede llamar/localizar a su doctor(a) al nmero que aparece a continuacin.   Por favor, tenga en cuenta que aunque hacemos todo lo posible para estar disponibles para asuntos urgentes fuera del horario de [place], no estamos disponibles las 24 horas del da, los 7 [address]  [address] de la [place].   Si tiene un problema urgente y no puede comunicarse con nosotros, puede optar por buscar atencin mdica  en el consultorio de su doctor(a), en una clnica privada, en un centro de atencin urgente o en una sala de emergencias.  Si tiene Engineer, drilling, por favor llame inmediatamente al 911 o vaya a la sala de emergencias.  Nmeros de bper  - Dr. Gwen Pounds:  [phone]  - Dra. Moye: [phone]  - Dra. Roseanne Reno: [phone]  En caso de inclemencias del [place], por favor llame a Lacy Duverney principal al [phone] para una actualizacin sobre el [place] de cualquier retraso o cierre.  Consejos para la medicacin en dermatologa: Por favor, guarde las cajas en las que vienen los medicamentos de uso tpico para ayudarle a seguir las instrucciones sobre dnde y cmo usarlos. Las farmacias generalmente imprimen las instrucciones del medicamento slo en las cajas y no directamente en los tubos del [place].   Si su medicamento es muy caro, por favor, pngase en contacto con Rolm Gala llamando al [phone] y presione la opcin 4 o envenos un mensaje a travs de Clinical cytogeneticist.   No podemos decirle cul ser su copago por los medicamentos por adelantado ya que esto es diferente dependiendo de la cobertura de su seguro. Sin embargo, es posible que podamos encontrar un medicamento sustituto a Audiological scientist un formulario para que el [place]  el medicamento que se considera necesario.   Si se requiere una autorizacin previa para que su compaa de seguros [place] su medicamento, por favor permtanos de 1 a 2 das hbiles para completar [address]  Los precios de los medicamentos varan con frecuencia dependiendo del Environmental consultant de dnde se surte la receta y alguna farmacias pueden ofrecer precios ms baratos.  El sitio web www.goodrx.com tiene cupones para medicamentos de Health and safety inspector. Los precios aqu no tienen en cuenta lo que podra costar con la ayuda del seguro (puede ser ms barato con su seguro), pero el sitio web puede darle el precio si no utiliz Tourist information centre manager.  - Puede imprimir el cupn correspondiente y llevarlo con su receta a la farmacia.  - Tambin puede pasar por nuestra oficina durante el horario de atencin regular y Education officer, museum una tarjeta de cupones de GoodRx.  - Si necesita que su receta se enve electrnicamente a  una farmacia diferente, informe a nuestra oficina a travs de MyChart de [place] o por telfono llamando al [phone] y presione la opcin 4.

## 2022-11-03 LAB — VITAMIN D 25 HYDROXY (VIT D DEFICIENCY, FRACTURES): Vit D, 25-Hydroxy: 74.8 ng/mL (ref 30.0–100.0)

## 2022-11-03 LAB — TSH: TSH: 4.64 u[IU]/mL — ABNORMAL HIGH (ref 0.450–4.500)

## 2022-11-09 ENCOUNTER — Telehealth: Payer: Self-pay

## 2022-11-09 NOTE — Telephone Encounter (Signed)
Left pt msg to call for lab results/sh 

## 2022-11-09 NOTE — Telephone Encounter (Signed)
Discussed lab with patient, instructed patient to see her PCP to discuss elevated TSH, patient aware

## 2022-11-09 NOTE — Telephone Encounter (Signed)
-----   Message from Willeen Niece, MD sent at 11/09/2022  1:23 PM EDT ----- TSH is slightly elevated.  She should have this evaluated further by PCP as she might be hypothyroid which can cause hair loss.  Vit D level is normal.   - please call patient

## 2022-12-14 ENCOUNTER — Telehealth: Payer: Self-pay

## 2022-12-14 NOTE — Telephone Encounter (Signed)
Patient called regarding minoxidil medication. She states for the last several weeks she has had chest pains and tender breast. Please advise.

## 2022-12-15 NOTE — Telephone Encounter (Signed)
Patient advised of information per Dr. Stewart. aw °

## 2022-12-29 ENCOUNTER — Ambulatory Visit
Admission: RE | Admit: 2022-12-29 | Discharge: 2022-12-29 | Disposition: A | Discharge status: Home / Self Care | Payer: Medicare Other | Source: Ambulatory Visit | Attending: Infectious Diseases | Admitting: Infectious Diseases

## 2022-12-29 DIAGNOSIS — Z1231 Encounter for screening mammogram for malignant neoplasm of breast: Secondary | ICD-10-CM | POA: Insufficient documentation

## 2023-02-22 ENCOUNTER — Encounter: Payer: Self-pay | Admitting: Dermatology

## 2023-02-22 ENCOUNTER — Ambulatory Visit: Payer: Medicare Other | Admitting: Dermatology

## 2023-02-22 VITALS — BP 108/67 | HR 79

## 2023-02-22 DIAGNOSIS — L650 Telogen effluvium: Secondary | ICD-10-CM

## 2023-02-22 DIAGNOSIS — L820 Inflamed seborrheic keratosis: Secondary | ICD-10-CM | POA: Diagnosis not present

## 2023-02-22 DIAGNOSIS — L821 Other seborrheic keratosis: Secondary | ICD-10-CM | POA: Diagnosis not present

## 2023-02-22 DIAGNOSIS — L649 Androgenic alopecia, unspecified: Secondary | ICD-10-CM | POA: Diagnosis not present

## 2023-02-22 DIAGNOSIS — L578 Other skin changes due to chronic exposure to nonionizing radiation: Secondary | ICD-10-CM | POA: Diagnosis not present

## 2023-02-22 DIAGNOSIS — W908XXA Exposure to other nonionizing radiation, initial encounter: Secondary | ICD-10-CM

## 2023-02-22 MED ORDER — FINASTERIDE 5 MG PO TABS
5.0000 mg | ORAL_TABLET | Freq: Every day | ORAL | 4 refills | Status: DC
Start: 2023-02-22 — End: 2024-02-27

## 2023-02-22 MED ORDER — MINOXIDIL 2.5 MG PO TABS
ORAL_TABLET | ORAL | 4 refills | Status: DC
Start: 2023-02-22 — End: 2023-08-15

## 2023-02-22 NOTE — Progress Notes (Signed)
Follow-Up Visit   Subjective  Ann Young is a 82 y.o. female who presents for the following: patient here for follow up on alopecia. Has been taking minoxidil 2.5 mg tab by mouth for 1 month. Reports she has not noticed a lot of hair growth. Also noticed some spots she would like checked  left hand, left shoulder, left ear (previously frozen), posterior neck into scalp (previously frozen) that are bothersome.   The patient has spots, moles and lesions to be evaluated, some may be new or changing and the patient may have concern these could be cancer.   The following portions of the chart were reviewed this encounter and updated as appropriate: medications, allergies, medical history  Review of Systems:  No other skin or systemic complaints except as noted in HPI or Assessment and Plan.  Objective  Well appearing patient in no apparent distress; mood and affect are within normal limits.  A focused examination was performed of the following areas: Scalp, left shoulder, left ear, posterior scalp/neck, left hand  Relevant exam findings are noted in the Assessment and Plan.  occipital scalp x 1, left shoulder x 1, left anterior upper helix  x 1, left wrist x 1 (4) Erythematous stuck-on, waxy papule    Assessment & Plan   TELOGEN EFFLUVIUM with underlying ANDROGENETIC ALOPECIA   Exam: Increased thinning at BL temporal/frontal scalp.  Reviewed prior photos. Some short hairs c/w regrowth noted at BL temporal scalp   Chronic and persistent condition with duration or expected duration over one year. Condition is improving with treatment but not currently at goal.    Telogen effluvium is a benign, self-limited condition causing increased hair shedding usually for several months. It does not progress to baldness, and the hair eventually grows back on its own. It can be triggered by recent illness, recent surgery, thyroid disease, low iron stores, vitamin D deficiency, fad diets or  rapid weight loss, hormonal changes such as pregnancy or birth control pills, and some medication. Usually the hair loss starts 2-3 months after the illness or health change. Rarely, it can continue for longer than a year. Treatments options may include oral or topical Minoxidil; Red Light scalp treatments; Biotin 2.5 mg daily and other options.  Female Androgenic Alopecia is a chronic condition related to genetics and/or hormonal changes.  In women androgenetic alopecia is commonly associated with menopause but may occur any time after puberty.  It causes hair thinning primarily on the crown with widening of the part and temporal hairline recession.  Can use OTC Rogaine (minoxidil) 5% solution/foam as directed.  Oral treatments in female patients who have no contraindication may include : - Low dose oral minoxidil 1.25 - 5mg  daily - Spironolactone 50 - 100mg  bid - Finasteride 2.5 - 5 mg daily Adjunctive therapies include: - Low Level Laser Light Therapy (LLLT) - Platelet-rich plasma injections (PRP) - Hair Transplants or scalp reduction    Treatment Plan:  Reviewed Ferritin and CBC within normal range 01/17/2023  Start Finasteride 5 mg tab - take 1/2 tab by mouth daily for 1 month if no side effects can increase to 1 whole tab by mouth daily   Continue minoxidil 2.5 MG 1 full tablet every day, Discussed it can take 6mos of use to see results.  Doses of minoxidil for hair loss are considered 'low dose'. This is because the doses used for hair loss are much lower than the doses which are used for conditions such as high blood  pressure (hypertension). The doses used for hypertension are 10-40mg  per day.  Side effects are uncommon at the low doses (up to 2.5 mg/day) used to treat hair loss. Potential side effects, more commonly seen at higher doses, include: Increase in hair growth (hypertrichosis) elsewhere on face and body Temporary hair shedding upon starting medication which may last up to 4  weeks Ankle swelling, fluid retention, rapid weight gain more than 5 pounds Low blood pressure and feeling lightheaded or dizzy when standing up quickly Fast or irregular heartbeat Headaches     Telogen effluvium  Related Medications finasteride (PROSCAR) 5 MG tablet Take 1 tablet (5 mg total) by mouth daily.  minoxidil (LONITEN) 2.5 MG tablet Take 1 tablet by mouth daily.  Inflamed seborrheic keratosis (4) occipital scalp x 1, left shoulder x 1, left anterior upper helix  x 1, left wrist x 1  Symptomatic, irritating, patient would like treated.  Residual isk at occipital scalp x 1 and left anterior upper helix  x 1  Destruction of lesion - occipital scalp x 1, left shoulder x 1, left anterior upper helix  x 1, left wrist x 1 (4)  Destruction method: cryotherapy   Informed consent: discussed and consent obtained   Lesion destroyed using liquid nitrogen: Yes   Region frozen until ice ball extended beyond lesion: Yes   Outcome: patient tolerated procedure well with no complications   Post-procedure details: wound care instructions given   Additional details:  Prior to procedure, discussed risks of blister formation, small wound, skin dyspigmentation, or rare scar following cryotherapy. Recommend Vaseline ointment to treated areas while healing.   Androgenic alopecia  Seborrheic keratosis  Actinic skin damage    SEBORRHEIC KERATOSIS - Stuck-on, waxy, tan-brown papules and/or plaques  - Benign-appearing - Discussed benign etiology and prognosis. - Observe - Call for any changes  ACTINIC DAMAGE - chronic, secondary to cumulative UV radiation exposure/sun exposure over time - diffuse scaly erythematous macules with underlying dyspigmentation - Recommend daily broad spectrum sunscreen SPF 30+ to sun-exposed areas, reapply every 2 hours as needed.  - Recommend staying in the shade or wearing long sleeves, sun glasses (UVA+UVB protection) and wide brim hats (4-inch brim  around the entire circumference of the hat). - Call for new or changing lesions.   Return in about 6 months (around 08/22/2023) for alopecia follow up.  I, Asher Muir, CMA, am acting as scribe for Willeen Niece, MD.   Documentation: I have reviewed the above documentation for accuracy and completeness, and I agree with the above.  Willeen Niece, MD

## 2023-02-22 NOTE — Patient Instructions (Addendum)
Start finasteride 5 mg tab - take 1 /2 tab by mouth daily for 1 month if no side effects can increase to 1 whole tablet by mouth daily  Continue Minoxidil 2.5 mg tab by mouth daily     Doses of minoxidil for hair loss are considered 'low dose'. This is because the doses used for hair loss are much lower than the doses which are used for conditions such as high blood pressure (hypertension). The doses used for hypertension are 10-40mg  per day.  Side effects are uncommon at the low doses (up to 2.5 mg/day) used to treat hair loss. Potential side effects, more commonly seen at higher doses, include: Increase in hair growth (hypertrichosis) elsewhere on face and body Temporary hair shedding upon starting medication which may last up to 4 weeks Ankle swelling, fluid retention, rapid weight gain more than 5 pounds Low blood pressure and feeling lightheaded or dizzy when standing up quickly Fast or irregular heartbeat Headaches   Cryotherapy Aftercare  Wash gently with soap and water everyday.   Apply Vaseline and Band-Aid daily until healed.    Seborrheic Keratosis  What causes seborrheic keratoses? Seborrheic keratoses are harmless, common skin growths that first appear during adult life.  As time goes by, more growths appear.  Some people may develop a large number of them.  Seborrheic keratoses appear on both covered and uncovered body parts.  They are not caused by sunlight.  The tendency to develop seborrheic keratoses can be inherited.  They vary in color from skin-colored to gray, brown, or even black.  They can be either smooth or have a rough, warty surface.   Seborrheic keratoses are superficial and look as if they were stuck on the skin.  Under the microscope this type of keratosis looks like layers upon layers of skin.  That is why at times the top layer may seem to fall off, but the rest of the growth remains and re-grows.    Treatment Seborrheic keratoses do not need to be  treated, but can easily be removed in the office.  Seborrheic keratoses often cause symptoms when they rub on clothing or jewelry.  Lesions can be in the way of shaving.  If they become inflamed, they can cause itching, soreness, or burning.  Removal of a seborrheic keratosis can be accomplished by freezing, burning, or surgery. If any spot bleeds, scabs, or grows rapidly, please return to have it checked, as these can be an indication of a skin cancer.  Due to recent changes in healthcare laws, you may see results of your pathology and/or laboratory studies on MyChart before the doctors have had a chance to review them. We understand that in some cases there may be results that are confusing or concerning to you. Please understand that not all results are received at the same time and often the doctors may need to interpret multiple results in order to provide you with the best plan of care or course of treatment. Therefore, we ask that you please give [place] 2 business days to thoroughly review all your results before contacting the office for clarification. Should we see a critical lab result, you will be contacted sooner.   If You Need Anything After Your Visit  If you have any questions or concerns for your doctor, please call our main line at [phone] and press option 4 to reach your doctor's medical assistant. If no one answers, please leave a voicemail as directed and we will return your call  as soon as possible. Messages left after 4 pm will be answered the following business day.   You may also send [place] a message via MyChart. We typically respond to MyChart messages within 1-2 business days.  For prescription refills, please ask your pharmacy to contact our office. Our fax number is [phone].  If you have an urgent issue when the clinic is closed that cannot wait until the next business day, you can page your doctor at the number below.    Please note that while we do our best to be  available for urgent issues outside of office hours, we are not available 24/7.   If you have an urgent issue and are unable to reach [place], you may choose to seek medical care at your doctor's office, retail clinic, urgent care center, or emergency room.  If you have a medical emergency, please immediately call 911 or go to the emergency department.  Pager Numbers  - Dr. Gwen Pounds: [phone]  - Dr. Roseanne Reno: [phone]  - Dr. Katrinka Blazing: [phone]   In the event of inclement weather, please call our main line at [phone] for an update on the status of any delays or closures.  Dermatology Medication Tips: Please keep the boxes that topical medications come in in order to help keep track of the instructions about where and how to use these. Pharmacies typically print the medication instructions only on the boxes and not directly on the medication tubes.   If your medication is too expensive, please contact our office at [phone] option 4 or send [place] a message through MyChart.   We are unable to tell what your co-pay for medications will be in advance as this is different depending on your insurance coverage. However, we may be able to find a substitute medication at lower cost or fill out paperwork to get insurance to cover a needed medication.   If a prior authorization is required to get your medication covered by your insurance company, please allow [place] 1-2 business days to complete this process.  Drug prices often vary depending on where the prescription is filled and some pharmacies may offer cheaper prices.  The website www.goodrx.com contains coupons for medications through different pharmacies. The prices here do not account for what the cost may be with help from insurance (it may be cheaper with your insurance), but the website can give you the price if you did not use any insurance.  - You can print the associated coupon and take it with your prescription to the pharmacy.   - You may also stop by our office during regular business hours and pick up a GoodRx coupon card.  - If you need your prescription sent electronically to a different pharmacy, notify our office through [facility] or by phone at [phone] option 4.     Si Usted Necesita Algo Despus de Su Visita  Tambin puede enviarnos un mensaje a travs de Clinical cytogeneticist. Por lo general respondemos a los mensajes de MyChart en el transcurso de 1 a 2 das hbiles.  Para renovar recetas, por favor pida a su farmacia que se ponga en contacto con nuestra oficina. Annie Sable de fax es [place] [phone].  Si tiene un asunto urgente cuando la clnica est cerrada y que no puede esperar hasta el siguiente da hbil, puede llamar/localizar a su doctor(a) al nmero que aparece a continuacin.   Por favor, tenga en cuenta que aunque hacemos todo lo posible para estar disponibles para asuntos urgentes fuera  del horario de oficina, no estamos disponibles las 24 horas del da, los 7 [address]  [address] de la [place].   Si tiene un problema urgente y no puede comunicarse con nosotros, puede optar por buscar atencin mdica  en el consultorio de su doctor(a), en una clnica privada, en un centro de atencin urgente o en una sala de emergencias.  Si tiene Engineer, drilling, por favor llame inmediatamente al 911 o vaya a la sala de emergencias.  Nmeros de bper  - Dr. Gwen Pounds: [phone]  - Dra. Roseanne Reno: [phone]  - Dr. Katrinka Blazing: [phone]   En caso de inclemencias del tiempo, por favor llame a Lacy Duverney principal al [phone] para una actualizacin sobre el [place] de cualquier retraso o cierre.  Consejos para la medicacin en dermatologa: Por favor, guarde las cajas en las que vienen los medicamentos de uso tpico para ayudarle a seguir las instrucciones sobre dnde y cmo usarlos. Las farmacias generalmente imprimen las instrucciones del medicamento slo en las cajas y no directamente en los tubos del  [place].   Si su medicamento es muy caro, por favor, pngase en contacto con Rolm Gala llamando al [phone] y presione la opcin 4 o envenos un mensaje a travs de Clinical cytogeneticist.   No podemos decirle cul ser su copago por los medicamentos por adelantado ya que esto es diferente dependiendo de la cobertura de su seguro. Sin embargo, es posible que podamos encontrar un medicamento sustituto a Audiological scientist un formulario para que el seguro cubra el medicamento que se considera necesario.   Si se requiere una autorizacin previa para que su compaa de seguros [place] su medicamento, por favor permtanos de 1 a 2 das hbiles para completar [address]  Los precios de los medicamentos varan con frecuencia dependiendo del Environmental consultant de dnde se surte la receta y alguna farmacias pueden ofrecer precios ms baratos.  El sitio web www.goodrx.com tiene cupones para medicamentos de Health and safety inspector. Los precios aqu no tienen en cuenta lo que podra costar con la ayuda del seguro (puede ser ms barato con su seguro), pero el sitio web puede darle el precio si no utiliz Tourist information centre manager.  - Puede imprimir el cupn correspondiente y llevarlo con su receta a la farmacia.  - Tambin puede pasar por nuestra oficina durante el horario de atencin regular y Education officer, museum una tarjeta de cupones de GoodRx.  - Si necesita que su receta se enve electrnicamente a una farmacia diferente, informe a nuestra oficina a travs de MyChart de [place] o por telfono llamando al [phone] y presione la opcin 4.      Due to recent changes in healthcare laws, you may see results of your pathology and/or laboratory studies on MyChart before the doctors have had a chance to review them. We understand that in some cases there may be results that are confusing or concerning to you. Please understand that not all results are received at the same time and often the doctors may need to interpret multiple results in  order to provide you with the best plan of care or course of treatment. Therefore, we ask that you please give [place] 2 business days to thoroughly review all your results before contacting the office for clarification. Should we see a critical lab result, you will be contacted sooner.   If You Need Anything After Your Visit  If you have any questions or concerns for your doctor, please call our main line at [phone] and press option 4 to reach your  doctor's Engineer, site. If no one answers, please leave a voicemail as directed and we will return your call as soon as possible. Messages left after 4 pm will be answered the following business day.   You may also send [place] a message via MyChart. We typically respond to MyChart messages within 1-2 business days.  For prescription refills, please ask your pharmacy to contact our office. Our fax number is [phone].  If you have an urgent issue when the clinic is closed that cannot wait until the next business day, you can page your doctor at the number below.    Please note that while we do our best to be available for urgent issues outside of office hours, we are not available 24/7.   If you have an urgent issue and are unable to reach [place], you may choose to seek medical care at your doctor's office, retail clinic, urgent care center, or emergency room.  If you have a medical emergency, please immediately call 911 or go to the emergency department.  Pager Numbers  - Dr. Gwen Pounds: [phone]  - Dr. Roseanne Reno: [phone]  - Dr. Katrinka Blazing: [phone]   In the event of inclement weather, please call our main line at [phone] for an update on the status of any delays or closures.  Dermatology Medication Tips: Please keep the boxes that topical medications come in in order to help keep track of the instructions about where and how to use these. Pharmacies typically print the medication instructions only on the boxes and not directly on  the medication tubes.   If your medication is too expensive, please contact our office at [phone] option 4 or send [place] a message through MyChart.   We are unable to tell what your co-pay for medications will be in advance as this is different depending on your insurance coverage. However, we may be able to find a substitute medication at lower cost or fill out paperwork to get insurance to cover a needed medication.   If a prior authorization is required to get your medication covered by your insurance company, please allow [place] 1-2 business days to complete this process.  Drug prices often vary depending on where the prescription is filled and some pharmacies may offer cheaper prices.  The website www.goodrx.com contains coupons for medications through different pharmacies. The prices here do not account for what the cost may be with help from insurance (it may be cheaper with your insurance), but the website can give you the price if you did not use any insurance.  - You can print the associated coupon and take it with your prescription to the pharmacy.  - You may also stop by our office during regular business hours and pick up a GoodRx coupon card.  - If you need your prescription sent electronically to a different pharmacy, notify our office through [facility] or by phone at [phone] option 4.     Si Usted Necesita Algo Despus de Su Visita  Tambin puede enviarnos un mensaje a travs de Clinical cytogeneticist. Por lo general respondemos a los mensajes de MyChart en el transcurso de 1 a 2 das hbiles.  Para renovar recetas, por favor pida a su farmacia que se ponga en contacto con nuestra oficina. Annie Sable de fax es [place] [phone].  Si tiene un asunto urgente cuando la clnica est cerrada y que no puede esperar hasta el siguiente da hbil, puede llamar/localizar a su doctor(a) al nmero que aparece a continuacin.  Por favor, tenga en cuenta que aunque hacemos todo lo  posible para estar disponibles para asuntos urgentes fuera del horario de [place], no estamos disponibles las 24 horas del da, los 7 [address]  [address] de la [place].   Si tiene un problema urgente y no puede comunicarse con nosotros, puede optar por buscar atencin mdica  en el consultorio de su doctor(a), en una clnica privada, en un centro de atencin urgente o en una sala de emergencias.  Si tiene Engineer, drilling, por favor llame inmediatamente al 911 o vaya a la sala de emergencias.  Nmeros de bper  - Dr. Gwen Pounds: [phone]  - Dra. Roseanne Reno: [phone]  - Dr. Katrinka Blazing: [phone]   En caso de inclemencias del tiempo, por favor llame a Lacy Duverney principal al [phone] para una actualizacin sobre el [place] de cualquier retraso o cierre.  Consejos para la medicacin en dermatologa: Por favor, guarde las cajas en las que vienen los medicamentos de uso tpico para ayudarle a seguir las instrucciones sobre dnde y cmo usarlos. Las farmacias generalmente imprimen las instrucciones del medicamento slo en las cajas y no directamente en los tubos del [place].   Si su medicamento es muy caro, por favor, pngase en contacto con Rolm Gala llamando al [phone] y presione la opcin 4 o envenos un mensaje a travs de Clinical cytogeneticist.   No podemos decirle cul ser su copago por los medicamentos por adelantado ya que esto es diferente dependiendo de la cobertura de su seguro. Sin embargo, es posible que podamos encontrar un medicamento sustituto a Audiological scientist un formulario para que el seguro cubra el medicamento que se considera necesario.   Si se requiere una autorizacin previa para que su compaa de seguros [place] su medicamento, por favor permtanos de 1 a 2 das hbiles para completar [address]  Los precios de los medicamentos varan con frecuencia dependiendo del Environmental consultant de dnde se surte la receta y alguna farmacias pueden ofrecer precios ms baratos.  El sitio web  www.goodrx.com tiene cupones para medicamentos de Health and safety inspector. Los precios aqu no tienen en cuenta lo que podra costar con la ayuda del seguro (puede ser ms barato con su seguro), pero el sitio web puede darle el precio si no utiliz Tourist information centre manager.  - Puede imprimir el cupn correspondiente y llevarlo con su receta a la farmacia.  - Tambin puede pasar por nuestra oficina durante el horario de atencin regular y Education officer, museum una tarjeta de cupones de GoodRx.  - Si necesita que su receta se enve electrnicamente a una farmacia diferente, informe a nuestra oficina a travs de MyChart de [place] o por telfono llamando al [phone] y presione la opcin 4.

## 2023-05-04 ENCOUNTER — Ambulatory Visit: Payer: Medicare Other | Admitting: Dermatology

## 2023-05-04 DIAGNOSIS — L720 Epidermal cyst: Secondary | ICD-10-CM

## 2023-05-04 DIAGNOSIS — L738 Other specified follicular disorders: Secondary | ICD-10-CM | POA: Diagnosis not present

## 2023-05-04 DIAGNOSIS — W908XXA Exposure to other nonionizing radiation, initial encounter: Secondary | ICD-10-CM

## 2023-05-04 DIAGNOSIS — L821 Other seborrheic keratosis: Secondary | ICD-10-CM | POA: Diagnosis not present

## 2023-05-04 DIAGNOSIS — D1801 Hemangioma of skin and subcutaneous tissue: Secondary | ICD-10-CM | POA: Diagnosis not present

## 2023-05-04 DIAGNOSIS — L578 Other skin changes due to chronic exposure to nonionizing radiation: Secondary | ICD-10-CM

## 2023-05-04 DIAGNOSIS — L820 Inflamed seborrheic keratosis: Secondary | ICD-10-CM | POA: Diagnosis not present

## 2023-05-04 NOTE — Patient Instructions (Addendum)
Seborrheic Keratosis  What causes seborrheic keratoses? Seborrheic keratoses are harmless, common skin growths that first appear during adult life.  As time goes by, more growths appear.  Some people may develop a large number of them.  Seborrheic keratoses appear on both covered and uncovered body parts.  They are not caused by sunlight.  The tendency to develop seborrheic keratoses can be inherited.  They vary in color from skin-colored to gray, brown, or even black.  They can be either smooth or have a rough, warty surface.   Seborrheic keratoses are superficial and look as if they were stuck on the skin.  Under the microscope this type of keratosis looks like layers upon layers of skin.  That is why at times the top layer may seem to fall off, but the rest of the growth remains and re-grows.    Treatment Seborrheic keratoses do not need to be treated, but can easily be removed in the office.  Seborrheic keratoses often cause symptoms when they rub on clothing or jewelry.  Lesions can be in the way of shaving.  If they become inflamed, they can cause itching, soreness, or burning.  Removal of a seborrheic keratosis can be accomplished by freezing, burning, or surgery. If any spot bleeds, scabs, or grows rapidly, please return to have it checked, as these can be an indication of a skin cancer.Due to recent changes in healthcare laws, you may see results of your pathology and/or laboratory studies on MyChart before the doctors have had a chance to review them. We understand that in some cases there may be results that are confusing or concerning to you. Please understand that not all results are received at the same time and often the doctors may need to interpret multiple results in order to provide you with the best plan of care or course of treatment. Therefore, we ask that you please give [place] 2 business days to thoroughly review all your results before contacting the office for clarification.  Should we see a critical lab result, you will be contacted sooner.    Cryotherapy Aftercare  Wash gently with soap and water everyday.   Apply Vaseline and Band-Aid daily until healed.    If You Need Anything After Your Visit  If you have any questions or concerns for your doctor, please call our main line at [phone] and press option 4 to reach your doctor's medical assistant. If no one answers, please leave a voicemail as directed and we will return your call as soon as possible. Messages left after 4 pm will be answered the following business day.   You may also send [place] a message via MyChart. We typically respond to MyChart messages within 1-2 business days.  For prescription refills, please ask your pharmacy to contact our office. Our fax number is [phone].  If you have an urgent issue when the clinic is closed that cannot wait until the next business day, you can page your doctor at the number below.    Please note that while we do our best to be available for urgent issues outside of office hours, we are not available 24/7.   If you have an urgent issue and are unable to reach [place], you may choose to seek medical care at your doctor's office, retail clinic, urgent care center, or emergency room.  If you have a medical emergency, please immediately call 911 or go to the emergency department.  Pager Numbers  -  Dr. Gwen Pounds: [phone]  - Dr. Roseanne Reno: [phone]  - Dr. Katrinka Blazing: [phone]   In the event of inclement weather, please call our main line at [phone] for an update on the status of any delays or closures.  Dermatology Medication Tips: Please keep the boxes that topical medications come in in order to help keep track of the instructions about where and how to use these. Pharmacies typically print the medication instructions only on the boxes and not directly on the medication tubes.   If your medication is too expensive, please contact our office at  [phone] option 4 or send [place] a message through MyChart.   We are unable to tell what your co-pay for medications will be in advance as this is different depending on your insurance coverage. However, we may be able to find a substitute medication at lower cost or fill out paperwork to get insurance to cover a needed medication.   If a prior authorization is required to get your medication covered by your insurance company, please allow [place] 1-2 business days to complete this process.  Drug prices often vary depending on where the prescription is filled and some pharmacies may offer cheaper prices.  The website www.goodrx.com contains coupons for medications through different pharmacies. The prices here do not account for what the cost may be with help from insurance (it may be cheaper with your insurance), but the website can give you the price if you did not use any insurance.  - You can print the associated coupon and take it with your prescription to the pharmacy.  - You may also stop by our office during regular business hours and pick up a GoodRx coupon card.  - If you need your prescription sent electronically to a different pharmacy, notify our office through [facility] or by phone at [phone] option 4.     Si Usted Necesita Algo Despus de Su Visita  Tambin puede enviarnos un mensaje a travs de Clinical cytogeneticist. Por lo general respondemos a los mensajes de MyChart en el transcurso de 1 a 2 das hbiles.  Para renovar recetas, por favor pida a su farmacia que se ponga en contacto con nuestra oficina. Annie Sable de fax es [place] [phone].  Si tiene un asunto urgente cuando la clnica est cerrada y que no puede esperar hasta el siguiente da hbil, puede llamar/localizar a su doctor(a) al nmero que aparece a continuacin.   Por favor, tenga en cuenta que aunque hacemos todo lo posible para estar disponibles para asuntos urgentes fuera del horario de [place], no estamos  disponibles las 24 horas del da, los 7 [address]  [address] de la [place].   Si tiene un problema urgente y no puede comunicarse con nosotros, puede optar por buscar atencin mdica  en el consultorio de su doctor(a), en una clnica privada, en un centro de atencin urgente o en una sala de emergencias.  Si tiene Engineer, drilling, por favor llame inmediatamente al 911 o vaya a la sala de emergencias.  Nmeros de bper  - Dr. Gwen Pounds: [phone]  - Dra. Roseanne Reno: [phone]  - Dr. Katrinka Blazing: [phone]   En caso de inclemencias del tiempo, por favor llame a Lacy Duverney principal al [phone] para una actualizacin sobre el [place] de cualquier retraso o cierre.  Consejos para la medicacin en dermatologa: Por favor, guarde las cajas en las que vienen los medicamentos de uso tpico para ayudarle a seguir las instrucciones sobre dnde y cmo usarlos. Las farmacias generalmente imprimen las instrucciones  del medicamento slo en las cajas y no directamente en los tubos del medicamento.   Si su medicamento es muy caro, por favor, pngase en contacto con Rolm Gala llamando al [phone] y presione la opcin 4 o envenos un mensaje a travs de Clinical cytogeneticist.   No podemos decirle cul ser su copago por los medicamentos por adelantado ya que esto es diferente dependiendo de la cobertura de su seguro. Sin embargo, es posible que podamos encontrar un medicamento sustituto a Audiological scientist un formulario para que el seguro cubra el medicamento que se considera necesario.   Si se requiere una autorizacin previa para que su compaa de seguros [place] su medicamento, por favor permtanos de 1 a 2 das hbiles para completar [address]  Los precios de los medicamentos varan con frecuencia dependiendo del Environmental consultant de dnde se surte la receta y alguna farmacias pueden ofrecer precios ms baratos.  El sitio web www.goodrx.com tiene cupones para medicamentos de Health and safety inspector. Los precios aqu no  tienen en cuenta lo que podra costar con la ayuda del seguro (puede ser ms barato con su seguro), pero el sitio web puede darle el precio si no utiliz Tourist information centre manager.  - Puede imprimir el cupn correspondiente y llevarlo con su receta a la farmacia.  - Tambin puede pasar por nuestra oficina durante el horario de atencin regular y Education officer, museum una tarjeta de cupones de GoodRx.  - Si necesita que su receta se enve electrnicamente a una farmacia diferente, informe a nuestra oficina a travs de MyChart de [place] o por telfono llamando al [phone] y presione la opcin 4.

## 2023-05-04 NOTE — Progress Notes (Signed)
Follow-Up Visit   Subjective  Ann Young is a 82 y.o. 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   Upper back spot and dark spot at left upper lip she would like checked .   The patient has spots, moles and lesions to be evaluated, some may be new or changing and the patient may have concern these could be cancer.   The following portions of the chart were reviewed this encounter and updated as appropriate: medications, allergies, medical history  Review of Systems:  No other skin or systemic complaints except as noted in HPI or Assessment and Plan.  Objective  Well appearing patient in no apparent distress; mood and affect are within normal limits.   A focused examination was performed of the following areas: Face, left ear, left nose, scalp  Relevant exam findings are noted in the Assessment and Plan.  right pretibia x 1, Erythematous stuck-on, waxy papule     Assessment & Plan   MILIA Exam: tiny erythematous firm white papule at left alar crease and occipital scalp  Discussed this is a type of cyst. Benign-appearing. Sometimes these will clear with OTC adapalene/Differin 0.1% cream QHS or retinol.  Discussed extraction if symptomatic. Patient states areas are irritated and bothersome.   Treatment Plan: Symptomatic, irritating, patient would like treated.  Two milia extracted today.  Procedure risks and benefits were discussed with the patient including bruising and verbal consent was obtained. Following prep of the skin of the left alar crease and occipital scalp with an alcohol swab, area was injected with 1% lidocaine/epinephrine, extraction of milia was performed with cotton  tip applicators following superficial incision made over their surfaces with a #11 surgical blade. Capillary hemostasis was achieved with 20% aluminum chloride solution. Vaseline ointment was applied to each site. The patient tolerated the procedure well.  Sebaceous Hyperplasia - Small yellow papules with a central dell - Benign-appearing - Observe. Call for changes.  Inflamed seborrheic keratosis right pretibia x 1,  Symptomatic, irritating, patient would like treated.  Destruction of lesion - right pretibia x 1,  Destruction method: cryotherapy   Informed consent: discussed and consent obtained   Lesion destroyed using liquid nitrogen: Yes   Region frozen until ice ball extended beyond lesion: Yes   Outcome: patient tolerated procedure well with no complications   Post-procedure details: wound care instructions given   Additional details:  Prior to procedure, discussed risks of blister formation, small wound, skin dyspigmentation, or rare scar following cryotherapy. Recommend Vaseline ointment to treated areas while healing.   HEMANGIOMA At left upper lip and central upper back Exam: tiny purple papule, red papule   Discussed benign nature. Recommend observation. Call for changes.  SEBORRHEIC KERATOSIS - Stuck-on, waxy, tan-brown papules and/or plaques  - Benign-appearing - Discussed benign etiology and prognosis. - Observe - Call for any changes  ACTINIC DAMAGE - chronic, secondary to cumulative UV radiation exposure/sun exposure over time - diffuse scaly erythematous macules with underlying dyspigmentation - Recommend daily broad spectrum sunscreen SPF 30+ to sun-exposed areas, reapply every 2 hours as needed.  - Recommend staying in the shade or wearing long sleeves, sun glasses (UVA+UVB protection) and wide brim hats (4-inch  brim around the entire circumference of the hat). - Call for new or changing lesions.   Return for keep follow up as scheduled in March  2023.  I, Asher Muir, CMA, am acting as scribe for Willeen Niece, MD.   Documentation: I have reviewed the above documentation for accuracy and completeness, and I agree with the above.  Willeen Niece, MD

## 2023-08-15 ENCOUNTER — Other Ambulatory Visit: Payer: Self-pay | Admitting: Dermatology

## 2023-08-15 DIAGNOSIS — L650 Telogen effluvium: Secondary | ICD-10-CM

## 2023-08-22 ENCOUNTER — Ambulatory Visit (INDEPENDENT_AMBULATORY_CARE_PROVIDER_SITE_OTHER): Payer: Medicare Other | Admitting: Urology

## 2023-08-22 VITALS — BP 127/70 | HR 114

## 2023-08-22 DIAGNOSIS — R350 Frequency of micturition: Secondary | ICD-10-CM

## 2023-08-22 DIAGNOSIS — N8111 Cystocele, midline: Secondary | ICD-10-CM

## 2023-08-22 DIAGNOSIS — R351 Nocturia: Secondary | ICD-10-CM | POA: Diagnosis not present

## 2023-08-22 LAB — URINALYSIS, COMPLETE
Bilirubin, UA: NEGATIVE
Glucose, UA: NEGATIVE
Ketones, UA: NEGATIVE
Nitrite, UA: NEGATIVE
Specific Gravity, UA: 1.02 (ref 1.005–1.030)
Urobilinogen, Ur: 0.2 mg/dL (ref 0.2–1.0)
pH, UA: 6.5 (ref 5.0–7.5)

## 2023-08-22 LAB — MICROSCOPIC EXAMINATION: Epithelial Cells (non renal): >10 /HPF — AB (ref 0–10)

## 2023-08-22 NOTE — Progress Notes (Signed)
 08/22/2023 10:23 AM   Ann Young Aug 16, 1940 [id number]  Referring provider: Mick Sell, MD [address] [place],  [place] [address]  Chief Complaint  Patient presents with   Follow-up    HPI: Dr. Apolinar Junes: Recurrent urinary tract infections; chronic pelvic pain with left lower quadrant discomfort; on Vesicare by gynecology.  She no longer was using a pessary  Patient's primary complaint is vaginal bulging sensation.  Sometimes she pushes on her bladder but does not do splinting maneuvers.  She is not a strong historian  She voids every 1-1/2 to 2 hours.  She can void every hour at night and does have some mild ankle edema.  She generally is continent.  If she holds it too long she might have urge incontinence or she coughs a little full bladder she might have stress incontinence but generally is pad free.  She does not take Vesicare.  She has not had a hysterectomy  Patient tried to pessaries and failed  Prone to constipation taking laxatives.  No neurologic issues     PMH: Past Medical History:  Diagnosis Date   Actinic keratosis    Anxiety    Arthritis    Asthma    Cancer (HCC)    colorectal   Cataract cortical, senile, bilateral    Depression    Dyspepsia    Dyspepsia    Elevated cholesterol    Esophagitis    Gastritis    GERD (gastroesophageal reflux disease)    Helicobacter pylori (H. pylori) infection    Hemorrhoids    Hiatal hernia    History of hiatal hernia    Hx of adenomatous colonic polyps    Osteoporosis    Peptic ulcer disease    Peptic ulcer disease    Shingles    Squamous cell carcinoma of skin    R temple, SCC IS txted with LN2 and 5FU from [facility]    Surgical History: Past Surgical History:  Procedure Laterality Date   BREAST BIOPSY Left 2000   benign   BREAST CYST ASPIRATION     CARDIAC CATHETERIZATION  2015   WNL   CATARACT EXTRACTION Bilateral 2013   CATARACT EXTRACTION, BILATERAL Bilateral 2013   Dr.  Fredrich Birks   COLON SURGERY     COLONOSCOPY N/A 10/03/2019   Procedure: COLONOSCOPY;  Surgeon: Toledo, Boykin Nearing, MD;  Location: [facility] ENDOSCOPY;  Service: Gastroenterology;  Laterality: N/A;   COLONOSCOPY WITH PROPOFOL N/A 02/04/2016   Procedure: COLONOSCOPY WITH PROPOFOL;  Surgeon: Scot Jun, MD;  Location: [facility] ENDOSCOPY;  Service: Endoscopy;  Laterality: N/A;   ESOPHAGOGASTRODUODENOSCOPY (EGD) WITH PROPOFOL N/A 02/04/2016   Procedure: ESOPHAGOGASTRODUODENOSCOPY (EGD) WITH PROPOFOL;  Surgeon: Scot Jun, MD;  Location: [facility] ENDOSCOPY;  Service: Endoscopy;  Laterality: N/A;   ESOPHAGOGASTRODUODENOSCOPY (EGD) WITH PROPOFOL N/A 10/03/2019   Procedure: ESOPHAGOGASTRODUODENOSCOPY (EGD) WITH PROPOFOL;  Surgeon: Toledo, Boykin Nearing, MD;  Location: [facility] ENDOSCOPY;  Service: Gastroenterology;  Laterality: N/A;   ESOPHAGOGASTRODUODENOSCOPY (EGD) WITH PROPOFOL N/A 06/01/2022   Procedure: ESOPHAGOGASTRODUODENOSCOPY (EGD) WITH PROPOFOL;  Surgeon: Regis Bill, MD;  Location: [facility] ENDOSCOPY;  Service: Endoscopy;  Laterality: N/A;   HYSTEROSCOPY WITH D & C N/A 02/04/2017   Procedure: DILATATION AND CURETTAGE /HYSTEROSCOPY;  Surgeon: Schermerhorn, Ihor Austin, MD;  Location: [facility] ORS;  Service: Gynecology;  Laterality: N/A;   resection of colorectal cancer without sequela  1999   resection of colorectal cancer without sequela     TUBAL LIGATION  1976    Home Medications:  Allergies as of 08/22/2023       Reactions   Clarithromycin Other (See Comments)   Questionable Biaxin vs Flagyl with chest discomfort & irregular heartbeat (most likely thought to be related to the Biaxin) Other reaction(s): Other (See Comments) Questionable Biaxin vs Flagyl with chest discomfort & irregular heartbeat (most likely thought to be related to the Biaxin) Questionable Biaxin vs Flagyl with chest discomfort & irregular heartbeat (most likely thought to be related to the Biaxin)   Fesoterodine    Other reaction(s):  Other (See Comments) weakness Other reaction(s): Other (See Comments) Other reaction(s): Other (See Comments) weakness weakness   Lamotrigine Other (See Comments)   Hair thinning Hair thinning Other reaction(s): Unknown Hair thinning Hair thinning   Metronidazole Other (See Comments)   Questionable Biaxin vs Flagyl with chest discomfort & irregular heartbeat (most likely thought to be related to the Biaxin) Other reaction(s): Other (See Comments), Other (See Comments) Questionable Biaxin vs Flagyl with chest discomfort & irregular heartbeat (most likely thought to be related to the Biaxin) Questionable Biaxin vs Flagyl with chest discomfort & irregular heartbeat (most likely thought to be related to the Biaxin)   Other Other (See Comments)   decongestants   Penicillins Nausea Only   Simvastatin Other (See Comments)   Hari Loss Other reaction(s): Other (See Comments), Other (See Comments) Hari Loss Hari Loss   Sulfa Antibiotics    Other reaction(s): Unknown Other reaction(s): Unknown Patient cant remember   Toviaz  [fesoterodine Fumarate Er] Other (See Comments)   weakness        Medication List        Accurate as of August 22, 2023 10:23 AM. If you have any questions, ask your nurse or doctor.          STOP taking these medications    albuterol 108 (90 Base) MCG/ACT inhaler Commonly known as: VENTOLIN HFA   loratadine 10 MG tablet Commonly known as: CLARITIN   mirabegron ER 25 MG Tb24 tablet Commonly known as: MYRBETRIQ   sertraline 50 MG tablet Commonly known as: ZOLOFT       TAKE these medications    aspirin 81 MG tablet Take 81 mg by mouth daily.   busPIRone 10 MG tablet Commonly known as: BUSPAR Take 10 mg once daily for one week and then increase to twice daily in AM and PM   Calcium Carb-Cholecalciferol 600-200 MG-UNIT Tabs Take 1 tablet by mouth 2 (two) times daily.   cetirizine 10 MG tablet Commonly known as: ZYRTEC Take 10 mg by  mouth daily.   docusate sodium 100 MG capsule Commonly known as: COLACE Take by mouth 2 (two) times daily.   estradiol 0.1 MG/GM vaginal cream Commonly known as: ESTRACE 1/4 app per vagina twice weekly   finasteride 5 MG tablet Commonly known as: PROSCAR Take 1 tablet (5 mg total) by mouth daily.   FISH OIL PO Take 1 capsule by mouth daily.   fluticasone 50 MCG/ACT nasal spray Commonly known as: FLONASE Place 1 spray into both nostrils daily.   hydrocortisone 2.5 % ointment   hydrocortisone 2.5 % cream Apply to affected area ear twice a day until improved.   ketoconazole 2 % cream Commonly known as: NIZORAL   minoxidil 2.5 MG tablet Commonly known as: LONITEN Take 1 tablet by mouth once daily   nortriptyline 50 MG capsule Commonly known as: PAMELOR Take 50 mg by mouth at bedtime.   pantoprazole 40 MG tablet Commonly known as: PROTONIX Take 1  tablet (40 mg total) by mouth daily.   polyethylene glycol 17 g packet Commonly known as: MIRALAX / GLYCOLAX Take 17 g by mouth daily.   triamcinolone lotion 0.1 % Commonly known as: KENALOG Apply 1 application topically daily as needed.   vitamin C 1000 MG tablet Take 1,000 mg by mouth daily.   VITAMIN D PO Take by mouth.        Allergies:  Allergies  Allergen Reactions   Clarithromycin Other (See Comments)    Questionable Biaxin vs Flagyl with chest discomfort & irregular heartbeat (most likely thought to be related to the Biaxin) Other reaction(s): Other (See Comments) Questionable Biaxin vs Flagyl with chest discomfort & irregular heartbeat (most likely thought to be related to the Biaxin) Questionable Biaxin vs Flagyl with chest discomfort & irregular heartbeat (most likely thought to be related to the Biaxin)   Fesoterodine     Other reaction(s): Other (See Comments) weakness Other reaction(s): Other (See Comments) Other reaction(s): Other (See Comments) weakness weakness   Lamotrigine Other (See  Comments)    Hair thinning Hair thinning Other reaction(s): Unknown Hair thinning Hair thinning    Metronidazole Other (See Comments)    Questionable Biaxin vs Flagyl with chest discomfort & irregular heartbeat (most likely thought to be related to the Biaxin) Other reaction(s): Other (See Comments), Other (See Comments) Questionable Biaxin vs Flagyl with chest discomfort & irregular heartbeat (most likely thought to be related to the Biaxin) Questionable Biaxin vs Flagyl with chest discomfort & irregular heartbeat (most likely thought to be related to the Biaxin)   Other Other (See Comments)    decongestants   Penicillins Nausea Only   Simvastatin Other (See Comments)    Hari Loss Other reaction(s): Other (See Comments), Other (See Comments) Hari Loss Hari Loss   Sulfa Antibiotics     Other reaction(s): Unknown Other reaction(s): Unknown Patient cant remember   Toviaz  [Fesoterodine Fumarate Er] Other (See Comments)    weakness    Family History: Family History  Problem Relation Age of Onset   Cirrhosis Sister    Colon polyps Sister    Leukemia Mother    Depression Mother    Heart disease Father    Depression Father    Bipolar disorder Daughter    Prostate cancer Neg Hx    Kidney cancer Neg Hx    Breast cancer Neg Hx     Social History:  reports that she has never smoked. She has never used smokeless tobacco. She reports that she does not drink alcohol and does not use drugs.  ROS:                                        Physical Exam: BP 127/70   Pulse (!) 114   Constitutional:  Alert and oriented, No acute distress. HEENT: [place] AT, moist mucus membranes.  Trachea midline, no masses. Cardiovascular: No clubbing, cyanosis, or edema. Respiratory: Normal respiratory effort, no increased work of breathing. GI: Abdomen is soft, nontender, nondistended, no abdominal masses GU: On pelvic examination patient had a grade 3 cystocele with central  defect.  Cervix distended from 8 or 9 cm to approximately 5 cm.  She has hypermobility the bladder neck and no stress incontinence with the prolapse reduced.  With the prolapse reduced she had no rectocele. Skin: No rashes, bruises or suspicious lesions. Lymph: No cervical or inguinal adenopathy. Neurologic:  Grossly intact, no focal deficits, moving all 4 extremities. Psychiatric: Normal mood and affect.  Laboratory Data: Lab Results  Component Value Date   WBC 5.9 07/22/2021   HGB 12.7 07/22/2021   HCT 37.8 07/22/2021   MCV 95 07/22/2021   PLT 159 07/22/2021    Lab Results  Component Value Date   CREATININE 0.82 07/22/2021    No results found for: "PSA"  No results found for: "TESTOSTERONE"  No results found for: "HGBA1C"  Urinalysis    Component Value Date/Time   COLORURINE YELLOW (A) 02/15/2016 1733   APPEARANCEUR Clear 03/23/2022 1448   LABSPEC 1.015 02/15/2016 1733   PHURINE 6.0 02/15/2016 1733   GLUCOSEU Negative 03/23/2022 1448   HGBUR NEGATIVE 02/15/2016 1733   BILIRUBINUR Negative 03/23/2022 1448   KETONESUR 2+ (A) 02/15/2016 1733   PROTEINUR Trace (A) 03/23/2022 1448   PROTEINUR NEGATIVE 02/15/2016 1733   UROBILINOGEN 0.2 07/22/2021 0923   NITRITE Negative 03/23/2022 1448   NITRITE NEGATIVE 02/15/2016 1733   LEUKOCYTESUR 1+ (A) 03/23/2022 1448    Pertinent Imaging: Urine reviewed.  Chart reviewed.  Assessment & Plan: Patient has prolapse.  Picture drawn.  Role of robotic hysterectomy and prolapse repair and urodynamics discussed.  Watchful waiting and natural history discussed.  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  Site of service discussed.  She wants to think about it since she has had it for many years.  She will call me if she would like to proceed and I will order the urodynamic  1. Nocturia (Primary)  - Urinalysis, Complete  2. Urinary frequency  -  Urinalysis, Complete   No follow-ups on file.  Martina Sinner, MD  [facility] [address] [place], [place] [address] [phone]

## 2023-08-23 ENCOUNTER — Ambulatory Visit: Payer: Medicare Other | Admitting: Dermatology

## 2023-08-23 ENCOUNTER — Encounter: Payer: Self-pay | Admitting: Dermatology

## 2023-08-23 DIAGNOSIS — L219 Seborrheic dermatitis, unspecified: Secondary | ICD-10-CM

## 2023-08-23 DIAGNOSIS — D1801 Hemangioma of skin and subcutaneous tissue: Secondary | ICD-10-CM | POA: Diagnosis not present

## 2023-08-23 DIAGNOSIS — L650 Telogen effluvium: Secondary | ICD-10-CM

## 2023-08-23 DIAGNOSIS — L649 Androgenic alopecia, unspecified: Secondary | ICD-10-CM | POA: Diagnosis not present

## 2023-08-23 DIAGNOSIS — Z872 Personal history of diseases of the skin and subcutaneous tissue: Secondary | ICD-10-CM

## 2023-08-23 DIAGNOSIS — Z79899 Other long term (current) drug therapy: Secondary | ICD-10-CM

## 2023-08-23 MED ORDER — FLUOCINOLONE ACETONIDE 0.01 % OT OIL: TOPICAL_OIL | OTIC | 1 refills | Status: DC

## 2023-08-23 MED ORDER — MINOXIDIL 2.5 MG PO TABS
2.5000 mg | ORAL_TABLET | Freq: Every day | ORAL | 1 refills | Status: DC
Start: 2023-08-23 — End: 2024-02-28

## 2023-08-23 NOTE — Progress Notes (Signed)
 Follow-Up Visit   Subjective  Ann Young is a 83 y.o. female who presents for the following: 6 month telogen effluvium, with underlying androgenetic alopecia, follow up. Is taking Minoxidil 2.5 mg daily. Tolerating well. Denies side effects or adverse reactions. Is not taking Finasteride, could not tolerate. She states it made her heart feel like it was racing.   Recheck left ear. Hx of ISK frozen several months ago. Has not resolved.   The patient has spots, moles and lesions to be evaluated, some may be new or changing and the patient may have concern these could be cancer.   The following portions of the chart were reviewed this encounter and updated as appropriate: medications, allergies, medical history  Review of Systems:  No other skin or systemic complaints except as noted in HPI or Assessment and Plan.  Objective  Well appearing patient in no apparent distress; mood and affect are within normal limits.  A focused examination was performed of the following areas: Scalp, face  Relevant exam findings are noted in the Assessment and Plan.    Assessment & Plan   ANDROGENETIC ALOPECIA   Exam: Thinning at BL temporal/frontal scalp.  Reviewed prior photos. Some short hairs c/w regrowth noted at BL temporal scalp. Stable when compared to photos.   Chronic and persistent condition with duration or expected duration over one year. Condition is improving with treatment but not currently at goal.   Female Androgenic Alopecia is a chronic condition related to genetics and/or hormonal changes.  In women androgenetic alopecia is commonly associated with menopause but may occur any time after puberty.  It causes hair thinning primarily on the crown with widening of the part and temporal hairline recession.  Can use OTC Rogaine (minoxidil) 5% solution/foam as directed.  Oral treatments in female patients who have no contraindication may include : - Low dose oral minoxidil 1.25 -  5mg  daily - Spironolactone 50 - 100mg  bid - Finasteride 2.5 - 5 mg daily Adjunctive therapies include: - Low Level Laser Light Therapy (LLLT) - Platelet-rich plasma injections (PRP) - Hair Transplants or scalp reduction    Treatment Plan:  d/c finasteride - pt not able to tolerate  Continue minoxidil 2.5 MG 1 full tablet every day.   Doses of minoxidil for hair loss are considered 'low dose'. This is because the doses used for hair loss are much lower than the doses which are used for conditions such as high blood pressure (hypertension). The doses used for hypertension are 10-40mg  per day.  Side effects are uncommon at the low doses (up to 2.5 mg/day) used to treat hair loss. Potential side effects, more commonly seen at higher doses, include: Increase in hair growth (hypertrichosis) elsewhere on face and body Temporary hair shedding upon starting medication which may last up to 4 weeks Ankle swelling, fluid retention, rapid weight gain more than 5 pounds Low blood pressure and feeling lightheaded or dizzy when standing up quickly Fast or irregular heartbeat Headaches  SEBORRHEIC DERMATITIS Exam: Pink patches with greasy scale at left ear anterior antihelix  Chronic and persistent condition with duration or expected duration over one year. Condition is symptomatic/ bothersome to patient. Not currently at goal.   Seborrheic Dermatitis is a chronic persistent rash characterized by pinkness and scaling most commonly of the mid face but also can occur on the scalp (dandruff), ears; mid chest, mid back and groin.  It tends to be exacerbated by stress and cooler weather.  People who have  neurologic disease may experience new onset or exacerbation of existing seborrheic dermatitis.  The condition is not curable but treatable and can be controlled.  Treatment Plan: Start Dermotic Oil Apply to AA ear twice daily until improved dsp 20 mL 1Rf.  HEMANGIOMA VS CYST Exam: 2.0 mm violaceous slightly  firm papule at left upper lip Benign-appearing. Recommend observation. Call for changes. TELOGEN EFFLUVIUM   Related Medications finasteride (PROSCAR) 5 MG tablet Take 1 tablet (5 mg total) by mouth daily. minoxidil (LONITEN) 2.5 MG tablet Take 1 tablet (2.5 mg total) by mouth daily.  Return in about 6 months (around 02/23/2024) for Alopecia, [facility].  I, Lawson Radar, CMA, am acting as scribe for Willeen Niece, MD.   Documentation: I have reviewed the above documentation for accuracy and completeness, and I agree with the above.  Willeen Niece, MD

## 2023-08-23 NOTE — Patient Instructions (Signed)

## 2023-09-05 ENCOUNTER — Telehealth: Payer: Self-pay

## 2023-09-05 NOTE — Telephone Encounter (Signed)
 Patient called to let Dr Roseanne Reno know Dermotic Oil is to expensive she would like a cheaper medication for her ears

## 2023-09-07 ENCOUNTER — Telehealth: Payer: Self-pay

## 2023-09-07 MED ORDER — FLUOCINOLONE ACETONIDE 0.01 % EX SOLN: Freq: Two times a day (BID) | CUTANEOUS | 0 refills | Status: DC

## 2023-09-07 NOTE — Addendum Note (Signed)
 Addended by: Dorathy Daft R on: 09/07/2023 01:49 PM   Modules accepted: Orders

## 2023-09-07 NOTE — Telephone Encounter (Signed)
Patient advised and RX sent in. aw 

## 2023-09-07 NOTE — Telephone Encounter (Signed)
 LM on VM returning her call please, call back.

## 2023-09-13 ENCOUNTER — Telehealth: Payer: Self-pay | Admitting: Urology

## 2023-09-13 NOTE — Telephone Encounter (Signed)
 Pt would like to proceed with urodynamic study.

## 2023-09-14 ENCOUNTER — Other Ambulatory Visit: Payer: Self-pay

## 2023-09-14 DIAGNOSIS — R351 Nocturia: Secondary | ICD-10-CM

## 2023-09-14 DIAGNOSIS — R350 Frequency of micturition: Secondary | ICD-10-CM

## 2023-09-14 NOTE — Telephone Encounter (Signed)
 Called pt to inform her that a referral order for UDS was put in and they should reach out to her to set this up. A UDS result follow up was scheduled with Dr.Macdiarmid, pt voiced understanding.

## 2023-11-21 ENCOUNTER — Ambulatory Visit: Admitting: Urology

## 2023-11-21 VITALS — BP 114/69 | HR 100

## 2023-11-21 DIAGNOSIS — N8111 Cystocele, midline: Secondary | ICD-10-CM | POA: Diagnosis not present

## 2023-11-21 DIAGNOSIS — R350 Frequency of micturition: Secondary | ICD-10-CM | POA: Diagnosis not present

## 2023-11-21 NOTE — Progress Notes (Signed)
 11/21/2023 10:10 AM   Ann Young 1940/11/28 [id number]  Referring provider: Eartha Gold, MD [address] 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  She no longer was using a pessary   Patient's primary complaint is vaginal bulging sensation.  Sometimes she pushes on her bladder but does not do splinting maneuvers.  She is not a strong historian   She voids every 1-1/2 to 2 hours.  She can void every hour at night and does have some mild ankle edema.  She generally is continent.  If she holds it too long she might have urge incontinence or she coughs a little full bladder she might have stress incontinence but generally is pad free.  She does not take Vesicare.   She has not had a hysterectomy   Patient tried to pessaries and failed    On pelvic examination patient had a grade 3 cystocele with central defect. Cervix distended from 8 or 9 cm to approximately 5 cm. She has hypermobility the bladder neck and no stress incontinence with the prolapse reduced. With the prolapse reduced she had no rectocele.   Patient has prolapse.  Picture drawn.  Role of robotic hysterectomy and prolapse repair and urodynamics discussed.  Watchful waiting and natural history discussed.  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  Site of service discussed.  She wants to think about it since she has had it for many years.  She will call me if she would like to proceed and I will order the urodynamic   Today Frequency stable.  Incontinence stable. Patient did not void prior to urodynamics and was catheterized for 150 mL.  Max and bladder capacity was 400 mL.  She did have low pressure overactivity reaching pressure 11  cmH2O.  They were provoked.  She had urgency and leaked a small amount.  She had no stress incontinence and could only generate a Valsalva pressure 66 cm of water.  It was felt that she generate a voluntary contraction reaching pressure 23 cm of water.  Maximum flow was 7 mL/s.  She voided 380 mL and a postvoid residual was 20 mL.  The contraction may have been subtraction artifact and she may have not generated this pressure voluntarily.  EMG activity normal.  Bladder neck distended a few centimeters and she had a moderate cystocele noted.  The details of the urodynamics are signed and dictated   PMH: Past Medical History:  Diagnosis Date   Actinic keratosis    Anxiety    Arthritis    Asthma    Cancer (HCC)    colorectal   Cataract cortical, senile, bilateral    Depression    Dyspepsia    Dyspepsia    Elevated cholesterol    Esophagitis    Gastritis    GERD (gastroesophageal reflux disease)    Helicobacter pylori (H. pylori) infection    Hemorrhoids    Hiatal hernia    History of hiatal hernia    Hx of adenomatous colonic polyps    Osteoporosis    Peptic ulcer disease    Peptic ulcer disease    Shingles    Squamous cell carcinoma of skin    R temple, SCC IS txted with LN2 and 5FU  from [facility]    Surgical History: Past Surgical History:  Procedure Laterality Date   BREAST BIOPSY Left 2000   benign   BREAST CYST ASPIRATION     CARDIAC CATHETERIZATION  2015   WNL   CATARACT EXTRACTION Bilateral 2013   CATARACT EXTRACTION, BILATERAL Bilateral 2013   Dr. Anita Barman   COLON SURGERY     COLONOSCOPY N/A 10/03/2019   Procedure: COLONOSCOPY;  Surgeon: Toledo, Alphonsus Jeans, MD;  Location: [facility] ENDOSCOPY;  Service: Gastroenterology;  Laterality: N/A;   COLONOSCOPY WITH PROPOFOL  N/A 02/04/2016   Procedure: COLONOSCOPY WITH PROPOFOL ;  Surgeon: Cassie Click, MD;  Location[facility] ENDOSCOPY;  Service: Endoscopy;  Laterality: N/A;   ESOPHAGOGASTRODUODENOSCOPY (EGD) WITH PROPOFOL  N/A 02/04/2016    Procedure: ESOPHAGOGASTRODUODENOSCOPY (EGD) WITH PROPOFOL ;  Surgeon: Cassie Click, MD;  Location: [facility] ENDOSCOPY;  Service: Endoscopy;  Laterality: N/A;   ESOPHAGOGASTRODUODENOSCOPY (EGD) WITH PROPOFOL  N/A 10/03/2019   Procedure: ESOPHAGOGASTRODUODENOSCOPY (EGD) WITH PROPOFOL ;  Surgeon: Toledo, Alphonsus Jeans, MD;  Location: [facility] ENDOSCOPY;  Service: Gastroenterology;  Laterality: N/A;   ESOPHAGOGASTRODUODENOSCOPY (EGD) WITH PROPOFOL  N/A 06/01/2022   Procedure: ESOPHAGOGASTRODUODENOSCOPY (EGD) WITH PROPOFOL ;  Surgeon: Shane Darling, MD;  Location: [facility] ENDOSCOPY;  Service: Endoscopy;  Laterality: N/A;   HYSTEROSCOPY WITH D & C N/A 02/04/2017   Procedure: DILATATION AND CURETTAGE /HYSTEROSCOPY;  Surgeon: Schermerhorn, Joselyn Nicely, MD;  Location: [facility] ORS;  Service: Gynecology;  Laterality: N/A;   resection of colorectal cancer without sequela  1999   resection of colorectal cancer without sequela     TUBAL LIGATION  1976    Home Medications:  Allergies as of 11/21/2023       Reactions   Clarithromycin Other (See Comments)   Questionable Biaxin vs Flagyl with chest discomfort & irregular heartbeat (most likely thought to be related to the Biaxin) Other reaction(s): Other (See Comments) Questionable Biaxin vs Flagyl with chest discomfort & irregular heartbeat (most likely thought to be related to the Biaxin) Questionable Biaxin vs Flagyl with chest discomfort & irregular heartbeat (most likely thought to be related to the Biaxin)   Fesoterodine    Other reaction(s): Other (See Comments) weakness Other reaction(s): Other (See Comments) Other reaction(s): Other (See Comments) weakness weakness   Lamotrigine Other (See Comments)   Hair thinning Hair thinning Other reaction(s): Unknown Hair thinning Hair thinning   Metronidazole Other (See Comments)   Questionable Biaxin vs Flagyl with chest discomfort & irregular heartbeat (most likely thought to be related to the Biaxin) Other  reaction(s): Other (See Comments), Other (See Comments) Questionable Biaxin vs Flagyl with chest discomfort & irregular heartbeat (most likely thought to be related to the Biaxin) Questionable Biaxin vs Flagyl with chest discomfort & irregular heartbeat (most likely thought to be related to the Biaxin)   Other Other (See Comments)   decongestants   Penicillins Nausea Only   Simvastatin Other (See Comments)   Hari Loss Other reaction(s): Other (See Comments), Other (See Comments) Hari Loss Hari Loss   Sulfa Antibiotics    Other reaction(s): Unknown Other reaction(s): Unknown Patient cant remember   Toviaz  [fesoterodine Fumarate Er] Other (See Comments)   weakness        Medication List        Accurate as of November 21, 2023 10:10 AM. If you have any questions, ask your nurse or doctor.          STOP taking these medications    nortriptyline 50 MG capsule Commonly known as: PAMELOR  TAKE these medications    aspirin 81 MG tablet Take 81 mg by mouth daily.   busPIRone  10 MG tablet Commonly known as: BUSPAR  Take 10 mg once daily for one week and then increase to twice daily in AM and PM   Calcium  Carb-Cholecalciferol 600-200 MG-UNIT Tabs Take 1 tablet by mouth 2 (two) times daily.   cetirizine  10 MG tablet Commonly known as: ZYRTEC  Take 10 mg by mouth daily.   docusate sodium 100 MG capsule Commonly known as: COLACE Take by mouth 2 (two) times daily.   estradiol 0.1 MG/GM vaginal cream Commonly known as: ESTRACE 1/4 app per vagina twice weekly   finasteride  5 MG tablet Commonly known as: PROSCAR  Take 1 tablet (5 mg total) by mouth daily.   FISH OIL PO Take 1 capsule by mouth daily.   fluocinolone  0.01 % external solution Commonly known as: SYNALAR  Apply topically 2 (two) times daily. To ear until improved.   Fluocinolone  Acetonide 0.01 % Oil Apply to affected area ear twice daily until improved.   fluticasone  50 MCG/ACT nasal spray Commonly  known as: FLONASE  Place 1 spray into both nostrils daily.   hydrocortisone  2.5 % ointment   hydrocortisone  2.5 % cream Apply to affected area ear twice a day until improved.   ketoconazole 2 % cream Commonly known as: NIZORAL   minoxidil  2.5 MG tablet Commonly known as: LONITEN  Take 1 tablet (2.5 mg total) by mouth daily.   pantoprazole  40 MG tablet Commonly known as: PROTONIX  Take 1 tablet (40 mg total) by mouth daily.   polyethylene glycol 17 g packet Commonly known as: MIRALAX / GLYCOLAX Take 17 g by mouth daily.   triamcinolone  lotion 0.1 % Commonly known as: KENALOG  Apply 1 application topically daily as needed.   vitamin C 1000 MG tablet Take 1,000 mg by mouth daily.   VITAMIN D  PO Take by mouth.        Allergies:  Allergies  Allergen Reactions   Clarithromycin Other (See Comments)    Questionable Biaxin vs Flagyl with chest discomfort & irregular heartbeat (most likely thought to be related to the Biaxin) Other reaction(s): Other (See Comments) Questionable Biaxin vs Flagyl with chest discomfort & irregular heartbeat (most likely thought to be related to the Biaxin) Questionable Biaxin vs Flagyl with chest discomfort & irregular heartbeat (most likely thought to be related to the Biaxin)   Fesoterodine     Other reaction(s): Other (See Comments) weakness Other reaction(s): Other (See Comments) Other reaction(s): Other (See Comments) weakness weakness   Lamotrigine Other (See Comments)    Hair thinning Hair thinning Other reaction(s): Unknown Hair thinning Hair thinning    Metronidazole Other (See Comments)    Questionable Biaxin vs Flagyl with chest discomfort & irregular heartbeat (most likely thought to be related to the Biaxin) Other reaction(s): Other (See Comments), Other (See Comments) Questionable Biaxin vs Flagyl with chest discomfort & irregular heartbeat (most likely thought to be related to the Biaxin) Questionable Biaxin vs Flagyl with  chest discomfort & irregular heartbeat (most likely thought to be related to the Biaxin)   Other Other (See Comments)    decongestants   Penicillins Nausea Only   Simvastatin Other (See Comments)    Hari Loss Other reaction(s): Other (See Comments), Other (See Comments) Hari Loss Hari Loss   Sulfa Antibiotics     Other reaction(s): Unknown Other reaction(s): Unknown Patient cant remember   Toviaz  [Fesoterodine Fumarate Er] Other (See Comments)    weakness  Family History: Family History  Problem Relation Age of Onset   Cirrhosis Sister    Colon polyps Sister    Leukemia Mother    Depression Mother    Heart disease Father    Depression Father    Bipolar disorder Daughter    Prostate cancer Neg Hx    Kidney cancer Neg Hx    Breast cancer Neg Hx     Social History:  reports that she has never smoked. She has never used smokeless tobacco. She reports that she does not drink alcohol and does not use drugs.  ROS:                                        Physical Exam: BP 114/69   Pulse 100   Constitutional:  Alert and oriented, No acute distress. HEENT:  AT, moist mucus membranes.  Trachea midline, no masses.  Laboratory Data: Lab Results  Component Value Date   WBC 5.9 07/22/2021   HGB 12.7 07/22/2021   HCT 37.8 07/22/2021   MCV 95 07/22/2021   PLT 159 07/22/2021    Lab Results  Component Value Date   CREATININE 0.82 07/22/2021    No results found for: "PSA"  No results found for: "TESTOSTERONE"  No results found for: "HGBA1C"  Urinalysis    Component Value Date/Time   COLORURINE YELLOW (A) 02/15/2016 1733   APPEARANCEUR Clear 08/22/2023 1034   LABSPEC 1.015 02/15/2016 1733   PHURINE 6.0 02/15/2016 1733   GLUCOSEU Negative 08/22/2023 1034   HGBUR NEGATIVE 02/15/2016 1733   BILIRUBINUR Negative 08/22/2023 1034   KETONESUR 2+ (A) 02/15/2016 1733   PROTEINUR Trace (A) 08/22/2023 1034   PROTEINUR NEGATIVE 02/15/2016 1733    UROBILINOGEN 0.2 07/22/2021 0923   NITRITE Negative 08/22/2023 1034   NITRITE NEGATIVE 02/15/2016 1733   LEUKOCYTESUR Trace (A) 08/22/2023 1034    Pertinent Imaging:   Assessment & Plan: Picture drawn.  Role of managing urgency with medication and watchful waiting discussed.  Role of robotic hysterectomy and prolapse repair discussed.  Based upon history and urodynamics I would not recommend a sling simultaneously  Patient at this point did not decide on any medication for urgency.  She would like to see Dr. Dulcy Gibney for robotic procedure.  Will arrange this.  She has not had cystoscopy.  She is very bothered by her prolapse and would like to proceed  There are no diagnoses linked to this encounter.  No follow-ups on file.  Devorah Fonder, MD  [facility] [address], [address] [place], [place] [address] [phone]

## 2023-11-29 ENCOUNTER — Other Ambulatory Visit: Payer: Self-pay | Admitting: Family Medicine

## 2023-11-29 DIAGNOSIS — M5412 Radiculopathy, cervical region: Secondary | ICD-10-CM

## 2023-12-01 ENCOUNTER — Ambulatory Visit
Admission: RE | Admit: 2023-12-01 | Discharge: 2023-12-01 | Disposition: A | Discharge status: Home / Self Care | Source: Ambulatory Visit | Attending: Family Medicine | Admitting: Family Medicine

## 2023-12-01 DIAGNOSIS — M5412 Radiculopathy, cervical region: Secondary | ICD-10-CM
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
## 2024-01-09 ENCOUNTER — Other Ambulatory Visit: Payer: Self-pay

## 2024-01-09 ENCOUNTER — Encounter: Payer: Self-pay | Admitting: Psychiatry

## 2024-01-09 ENCOUNTER — Ambulatory Visit (INDEPENDENT_AMBULATORY_CARE_PROVIDER_SITE_OTHER): Payer: Self-pay | Admitting: Psychiatry

## 2024-01-09 VITALS — BP 120/79 | HR 83 | Temp 97.2°F | Ht 60.0 in | Wt 125.8 lb

## 2024-01-09 DIAGNOSIS — F411 Generalized anxiety disorder: Secondary | ICD-10-CM

## 2024-01-09 DIAGNOSIS — F330 Major depressive disorder, recurrent, mild: Secondary | ICD-10-CM | POA: Diagnosis not present

## 2024-01-09 MED ORDER — VENLAFAXINE HCL ER 37.5 MG PO CP24: 37.5000 mg | ORAL_CAPSULE | Freq: Every day | ORAL | 1 refills | Status: DC

## 2024-01-09 NOTE — Patient Instructions (Signed)
 Start venlafaxine  37.5 mg daily  Next appointment- 9/11 at 3:30

## 2024-01-19 ENCOUNTER — Other Ambulatory Visit: Payer: Self-pay | Admitting: Infectious Diseases

## 2024-01-19 DIAGNOSIS — Z1231 Encounter for screening mammogram for malignant neoplasm of breast: Secondary | ICD-10-CM

## 2024-02-09 ENCOUNTER — Ambulatory Visit
Admission: RE | Admit: 2024-02-09 | Discharge: 2024-02-09 | Disposition: A | Discharge status: Home / Self Care | Source: Ambulatory Visit | Attending: Infectious Diseases | Admitting: Infectious Diseases

## 2024-02-09 DIAGNOSIS — Z1231 Encounter for screening mammogram for malignant neoplasm of breast: Secondary | ICD-10-CM | POA: Insufficient documentation

## 2024-02-22 ENCOUNTER — Telehealth: Payer: Self-pay | Admitting: Psychiatry

## 2024-02-22 NOTE — Telephone Encounter (Signed)
 You saw patient for initial appointment but now she is transferring to Dr. Eappen. She states the medication Venlaxaxine er 37.5 mg you prescribed is not doing anything for her. Wants to know if you can increase it. She meets with Dr. Coby on 02-27-24. Please advise

## 2024-02-22 NOTE — Telephone Encounter (Signed)
 Please advise her to continue the current medication regimen and discuss any changes with Dr. Eappen, especially since the appointment is in several days.

## 2024-02-23 ENCOUNTER — Ambulatory Visit: Admitting: Psychiatry

## 2024-02-27 ENCOUNTER — Other Ambulatory Visit: Payer: Self-pay

## 2024-02-27 ENCOUNTER — Encounter: Payer: Self-pay | Admitting: Psychiatry

## 2024-02-27 ENCOUNTER — Ambulatory Visit: Admitting: Psychiatry

## 2024-02-27 VITALS — BP 125/77 | HR 97 | Temp 95.8°F | Ht 60.0 in | Wt 123.2 lb

## 2024-02-27 DIAGNOSIS — F411 Generalized anxiety disorder: Secondary | ICD-10-CM | POA: Diagnosis not present

## 2024-02-27 DIAGNOSIS — F330 Major depressive disorder, recurrent, mild: Secondary | ICD-10-CM | POA: Diagnosis not present

## 2024-02-27 MED ORDER — BUSPIRONE HCL 10 MG PO TABS
10.0000 mg | ORAL_TABLET | Freq: Every day | ORAL | 0 refills | Status: AC | PRN
Start: 2024-02-27 — End: ?

## 2024-02-27 MED ORDER — VENLAFAXINE HCL ER 75 MG PO CP24: 75.0000 mg | ORAL_CAPSULE | Freq: Every day | ORAL | 0 refills | Status: DC
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
## 2024-02-27 NOTE — Patient Instructions (Signed)
 Insomnia Insomnia is a sleep disorder that makes it difficult to fall asleep or stay asleep. Insomnia can cause fatigue, low energy, difficulty concentrating, mood swings, and poor performance at work or school. There are three different ways to classify insomnia: Difficulty falling asleep. Difficulty staying asleep. Waking up too early in the morning. Any type of insomnia can be long-term (chronic) or short-term (acute). Both are common. Short-term insomnia usually lasts for 3 months or less. Chronic insomnia occurs at least three times a week for longer than 3 months. What are the causes? Insomnia may be caused by another condition, situation, or substance, such as: Having certain mental health conditions, such as anxiety and depression. Using caffeine, alcohol , tobacco, or drugs. Having gastrointestinal conditions, such as gastroesophageal reflux disease (GERD). Having certain medical conditions. These include: Asthma. Alzheimer's disease. Stroke. Chronic pain. An overactive thyroid  gland (hyperthyroidism). Other sleep disorders, such as restless legs syndrome and sleep apnea. Menopause. Sometimes, the cause of insomnia may not be known. What increases the risk? Risk factors for insomnia include: Gender. Females are affected more often than males. Age. Insomnia is more common as people get older. Stress and certain medical and mental health conditions. Lack of exercise. Having an irregular work schedule. This may include working night shifts and traveling between different time zones. What are the signs or symptoms? If you have insomnia, the main symptom is having trouble falling asleep or having trouble staying asleep. This may lead to other symptoms, such as: Feeling tired or having low energy. Feeling nervous about going to sleep. Not feeling rested in the morning. Having trouble concentrating. Feeling irritable, anxious, or depressed. How is this diagnosed? This condition  may be diagnosed based on: Your symptoms and medical history. Your health care provider may ask about: Your sleep habits. Any medical conditions you have. Your mental health. A physical exam. How is this treated? Treatment for insomnia depends on the cause. Treatment may focus on treating an underlying condition that is causing the insomnia. Treatment may also include: Medicines to help you sleep. Counseling or therapy. Lifestyle adjustments to help you sleep better. Follow these instructions at home: Eating and drinking  Limit or avoid alcohol , caffeinated beverages, and products that contain nicotine and tobacco, especially close to bedtime. These can disrupt your sleep. Do not eat a large meal or eat spicy foods right before bedtime. This can lead to digestive discomfort that can make it hard for you to sleep. Sleep habits  Keep a sleep diary to help you and your health care provider figure out what could be causing your insomnia. Write down: When you sleep. When you wake up during the night. How well you sleep and how rested you feel the next day. Any side effects of medicines you are taking. What you eat and drink. Make your bedroom a dark, comfortable place where it is easy to fall asleep. Put up shades or blackout curtains to block light from outside. Use a white noise machine to block noise. Keep the temperature cool. Limit screen use before bedtime. This includes: Not watching TV. Not using your smartphone, tablet, or computer. Stick to a routine that includes going to bed and waking up at the same times every day and night. This can help you fall asleep faster. Consider making a quiet activity, such as reading, part of your nighttime routine. Try to avoid taking naps during the day so that you sleep better at night. Get out of bed if you are still awake after  15 minutes of trying to sleep. Keep the lights down, but try reading or doing a quiet activity. When you feel  sleepy, go back to bed. General instructions Take over-the-counter and prescription medicines only as told by your health care provider. Exercise regularly as told by your health care provider. However, avoid exercising in the hours right before bedtime. Use relaxation techniques to manage stress. Ask your health care provider to suggest some techniques that may work well for you. These may include: Breathing exercises. Routines to release muscle tension. Visualizing peaceful scenes. Make sure that you drive carefully. Do not drive if you feel very sleepy. Keep all follow-up visits. This is important. Contact a health care provider if: You are tired throughout the day. You have trouble in your daily routine due to sleepiness. You continue to have sleep problems, or your sleep problems get worse. Get help right away if: You have thoughts about hurting yourself or someone else. Get help right away if you feel like you may hurt yourself or others, or have thoughts about taking your own life. Go to your nearest emergency room or: Call 911. Call the National Suicide Prevention Lifeline at 2232757840 or 988. This is open 24 hours a day. Text the Crisis Text Line at 657-529-4371. Summary Insomnia is a sleep disorder that makes it difficult to fall asleep or stay asleep. Insomnia can be long-term (chronic) or short-term (acute). Treatment for insomnia depends on the cause. Treatment may focus on treating an underlying condition that is causing the insomnia. Keep a sleep diary to help you and your health care provider figure out what could be causing your insomnia. This information is not intended to replace advice given to you by your health care provider. Make sure you discuss any questions you have with your health care provider. Document Revised: 05/11/2021 Document Reviewed: 05/11/2021 Elsevier Patient Education  2024 [employer].

## 2024-02-28 ENCOUNTER — Ambulatory Visit: Admitting: Dermatology

## 2024-02-28 DIAGNOSIS — D1801 Hemangioma of skin and subcutaneous tissue: Secondary | ICD-10-CM

## 2024-02-28 DIAGNOSIS — Z79899 Other long term (current) drug therapy: Secondary | ICD-10-CM

## 2024-02-28 DIAGNOSIS — L650 Telogen effluvium: Secondary | ICD-10-CM

## 2024-02-28 DIAGNOSIS — L219 Seborrheic dermatitis, unspecified: Secondary | ICD-10-CM

## 2024-02-28 DIAGNOSIS — L649 Androgenic alopecia, unspecified: Secondary | ICD-10-CM

## 2024-02-28 DIAGNOSIS — L820 Inflamed seborrheic keratosis: Secondary | ICD-10-CM | POA: Diagnosis not present

## 2024-02-28 MED ORDER — MINOXIDIL 2.5 MG PO TABS
2.5000 mg | ORAL_TABLET | Freq: Every day | ORAL | 1 refills | Status: AC
Start: 2024-02-28 — End: ?

## 2024-02-28 MED ORDER — FLUOCINOLONE ACETONIDE 0.01 % EX SOLN
CUTANEOUS | 1 refills | Status: AC
Start: 2024-02-28 — End: ?

## 2024-02-28 NOTE — Patient Instructions (Signed)

## 2024-02-28 NOTE — Progress Notes (Signed)
 Follow-Up Visit   Subjective  Ann Young is a 83 y.o. 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 She was on finasteride  previously, but not able to tolerate due to heart racing. She has noticed an increase in hair loss. Patient has had a lot of stress and will be having a surgery scheduled soon in the future.  She has a spot on the left upper lip that she thinks has grown a little. She would like it removed. Seborrheic dermatitis of the left ear improved with Dermotic  oil.   The patient has spots, moles and lesions to be evaluated, some may be new or changing.  Spots under her breast are irritated by her bra.   The following portions of the chart were reviewed this encounter and updated as appropriate: medications, allergies, medical history  Review of Systems:  No other skin or systemic complaints except as noted in HPI or Assessment and Plan.  Objective  Well appearing patient in no apparent distress; mood and affect are within normal limits.  A focused examination was performed of the following areas: Face, scalp, trunk  Relevant physical exam findings are noted in the Assessment and Plan.  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 Condition is symptomatic/ bothersome to patient. Not currently at goal.  Discussed that she may experience worsening hair loss (telogen effluvium) due to increased stress on body, such as her upcoming surgeries  Female Androgenic Alopecia is a chronic condition related to genetics and/or hormonal changes.  In women androgenetic alopecia is  commonly associated with menopause but may occur any time after puberty.  It causes hair thinning primarily on the crown with widening of the part and temporal hairline recession.  Can use OTC Rogaine  (minoxidil ) 5% solution/foam as directed.  Oral treatments in female patients who have no contraindication may include : - Low dose oral minoxidil  1.25 - 5mg  daily - Spironolactone 50 - 100mg  bid - Finasteride  2.5 - 5 mg daily Adjunctive therapies include: - Low Level Laser Light Therapy (LLLT) - Platelet-rich plasma injections (PRP) - Hair Transplants or scalp reduction   Treatment Plan: BP 114/60 Continue minoxidil  2.5 mg, take 1 full tablet once daily.  Dsp #90 (3 mo supply) 1 rf   Doses of oral minoxidil  for hair loss are considered 'low dose'. This is because the doses used for hair loss are much lower than the doses which are used for conditions such as high blood pressure (hypertension). The doses used for hypertension are 10-40mg  per day.  Side effects are uncommon at the low doses (up to 2.5 mg/day) used to treat hair loss. Potential side effects, more commonly seen at higher doses, include: Increase in hair growth (hypertrichosis) elsewhere on face and body Temporary hair shedding upon starting medication which may last up to 4 weeks Ankle swelling, fluid retention, rapid weight gain more than 5 pounds Low blood pressure and feeling lightheaded or dizzy when standing up quickly Fast or irregular heartbeat Headaches   Long term medication management.  Patient is using long term (months to years) prescription medication  to control their dermatologic condition.  These medications require periodic monitoring  to evaluate for efficacy and side effects and may require periodic laboratory monitoring.    HEMANGIOMA Exam: violaceous papule left upper vermilion edge  Destruction Procedure Note Destruction method: electrodesiccation  Informed consent: discussed and consent obtained    Lesion destroyed using hyfrecator: Yes   Outcome: patient tolerated procedure well with no complications   Post-procedure details: wound care instructions given   Locations: left upper vermilion border # of Lesions Treated: 1  Prior to procedure, discussed risks of scab formation, small wound, skin dyspigmentation, or rare scar following electrodesciccation. Recommend Vaseline ointment or Aquaphor ointment to treated areas while healing.  Also recommend spf 30+ sunscreen to treated areas when outdoors.  Discussed cosmetic procedure ED, noncovered.  $60 for 1st lesion and $15 for each additional lesion if done on the same day.  Maximum charge $350.  One touch-up treatment included no charge. Discussed risks of treatment including dyspigmentation, small scar, and/or recurrence. Recommend daily broad spectrum sunscreen SPF 30+/photoprotection to treated areas once healed.   SEBORRHEIC DERMATITIS Exam: left ear concha/canal with mild scaling  Chronic and persistent condition with duration or expected duration over one year. Condition is improving with treatment but not currently at goal.   Seborrheic Dermatitis is a chronic persistent rash characterized by pinkness and scaling most commonly of the mid face but also can occur on the scalp (dandruff), ears; mid chest, mid back and groin.  It tends to be exacerbated by stress and cooler weather.  People who have neurologic disease may experience new onset or exacerbation of existing seborrheic dermatitis.  The condition is not curable but treatable and can be controlled.  Treatment Plan: Continue Synalar  solution twice daily as needed itch. 60 L 1Rf. Rx on hold.    TELOGEN EFFLUVIUM   Related Medications minoxidil  (LONITEN ) 2.5 MG tablet Take 1 tablet (2.5 mg total) by mouth daily. SEBORRHEIC DERMATITIS   Related Medications fluocinolone  (SYNALAR ) 0.01 % external solution Apply to affected ear twice daily as needed. INFLAMED SEBORRHEIC  KERATOSIS (2) Left Inframammary x 2 (2) Symptomatic, irritating, patient would like treated. Destruction of lesion - Left Inframammary x 2 (2)  Destruction method: cryotherapy   Informed consent: discussed and consent obtained   Lesion destroyed using liquid nitrogen: Yes   Region frozen until ice ball extended beyond lesion: Yes   Outcome: patient tolerated procedure well with no complications   Post-procedure details: wound care instructions given   Additional details:  Prior to procedure, discussed risks of blister formation, small wound, skin dyspigmentation, or rare scar following cryotherapy. Recommend Vaseline ointment to treated areas while healing.     Return in about 6 months (around 08/27/2024) for Alopecia.  IAndrea Kerns, CMA, am acting as scribe for Rexene Rattler, MD .   Documentation: I have reviewed the above documentation for accuracy and completeness, and I agree with the above.  Rexene Rattler, MD

## 2024-03-08 ENCOUNTER — Ambulatory Visit: Admitting: Psychiatry

## 2024-04-05 ENCOUNTER — Other Ambulatory Visit: Payer: Self-pay

## 2024-04-05 ENCOUNTER — Ambulatory Visit (INDEPENDENT_AMBULATORY_CARE_PROVIDER_SITE_OTHER): Admitting: Psychiatry

## 2024-04-05 ENCOUNTER — Encounter: Payer: Self-pay | Admitting: Psychiatry

## 2024-04-05 VITALS — BP 135/78 | HR 86 | Temp 97.3°F | Ht 60.0 in | Wt 123.2 lb

## 2024-04-05 DIAGNOSIS — G4700 Insomnia, unspecified: Secondary | ICD-10-CM | POA: Diagnosis not present

## 2024-04-05 DIAGNOSIS — F411 Generalized anxiety disorder: Secondary | ICD-10-CM | POA: Diagnosis not present

## 2024-04-05 DIAGNOSIS — F330 Major depressive disorder, recurrent, mild: Secondary | ICD-10-CM

## 2024-04-05 MED ORDER — ESZOPICLONE 1 MG PO TABS
1.0000 mg | ORAL_TABLET | Freq: Every evening | ORAL | 0 refills | Status: DC | PRN
Start: 2024-04-05 — End: 2024-04-24

## 2024-04-05 NOTE — Progress Notes (Signed)
 BH MD OP Progress Note  04/05/2024 3:26 PM Ann Young  MRN:  [id number]  Chief Complaint:  Chief Complaint  Patient presents with   Follow-up   Anxiety   Depression   Medication Refill   Insomnia   Discussed the use of AI scribe software for clinical note transcription with the patient, who gave verbal consent to proceed.  History of Present Illness Ann Young is an 83 year old Caucasian female, on SSI, lives in [place], has a history of generalized anxiety disorder, MDD, GERD, hyperlipidemia, osteoporosis, hair loss, chronic constipation, history of colorectal polyp was evaluated in office today for a follow-up appointment.  Ongoing depressed mood and low energy continue, with her stating that she feels really depressed a lot even after recent medication adjustments. She reports variable appetite, with some days when she does not eat well, though overall she feels she is eating enough.  She describes persistent sleep difficulties, including restlessness at night, inability to fall asleep despite feeling tired, and frequently remaining awake until 2 a.m. she describes racing thoughts at night.  She denies symptoms of restless legs and snoring.  She recently tried doxylamine over-the-counter however it did not help.She reports prior trials of trazodone, Ambien, mirtazapine , melatonin, Unasyn, and possibly temazepam  for sleep, none of which were effective. She indicates she has tried so many different things for sleep but does not recall all specific medications.   Her current regimen includes venlafaxine  75 mg in the morning, which was increased at her last visit, but she feels it has not improved her depressive symptoms. She also takes buspirone  10 mg daily, as prescribed by her primary care provider, and notes that it does not provide significant benefit. She confirms taking buspirone  daily rather than as needed. She reports anxiety as better but still somewhat difficult  at times.  She denies any thoughts of hurting herself or others.   Visit Diagnosis:    ICD-10-CM   1. GAD (generalized anxiety disorder)  F41.1     2. MDD (major depressive disorder), recurrent episode, mild  F33.0     3. Insomnia, unspecified type  G47.00 eszopiclone (LUNESTA) 1 MG TABS tablet      Past Psychiatric History: I have reviewed past psychiatric history from progress note on 02/27/2024.  Past trials of medications like mirtazapine , trazodone, sertraline , BuSpar , Celexa and several others.  Past Medical History:  Past Medical History:  Diagnosis Date   Actinic keratosis    Anxiety    Arthritis    Asthma    Cancer (HCC)    colorectal   Cataract cortical, senile, bilateral    Depression    Dyspepsia    Dyspepsia    Elevated cholesterol    Esophagitis    Gastritis    GERD (gastroesophageal reflux disease)    Helicobacter pylori (H. 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 Chrystine   COLON SURGERY     COLONOSCOPY N/A 10/03/2019   Procedure: COLONOSCOPY;  Surgeon: Toledo, Ladell POUR, MD;  Location: [facility] ENDOSCOPY;  Service: Gastroenterology;  Laterality: N/A;   COLONOSCOPY WITH PROPOFOL  N/A 02/04/2016   Procedure: COLONOSCOPY WITH PROPOFOL ;  Surgeon: Lamar ONEIDA Holmes, MD;  Location: [facility] ENDOSCOPY;  Service: Endoscopy;  Laterality: N/A;   ESOPHAGOGASTRODUODENOSCOPY (EGD) WITH PROPOFOL  N/A 02/04/2016   Procedure: ESOPHAGOGASTRODUODENOSCOPY (EGD) WITH PROPOFOL ;  Surgeon: Lamar ONEIDA Holmes, MD;  Location: [facility] ENDOSCOPY;  Service: Endoscopy;  Laterality: N/A;   ESOPHAGOGASTRODUODENOSCOPY (EGD) WITH PROPOFOL  N/A 10/03/2019   Procedure: ESOPHAGOGASTRODUODENOSCOPY (EGD) WITH PROPOFOL ;  Surgeon: Toledo, Ladell POUR, MD;  Location: [facility] ENDOSCOPY;  Service: Gastroenterology;  Laterality: N/A;   ESOPHAGOGASTRODUODENOSCOPY (EGD) WITH PROPOFOL  N/A 06/01/2022   Procedure: ESOPHAGOGASTRODUODENOSCOPY (EGD) WITH PROPOFOL ;  Surgeon: Maryruth Ole ONEIDA, MD;  Location: [facility] ENDOSCOPY;  Service: Endoscopy;  Laterality: N/A;   HYSTEROSCOPY WITH D & C N/A 02/04/2017   Procedure: DILATATION AND CURETTAGE /HYSTEROSCOPY;  Surgeon: Schermerhorn, Debby PARAS, MD;  Location: [facility] ORS;  Service: Gynecology;  Laterality: N/A;   resection of colorectal cancer without sequela  1999   resection of colorectal cancer without sequela     TUBAL LIGATION  1976    Family Psychiatric History: I have reviewed family psychiatric history from progress note on 02/27/2024.  Family History:  Family History  Problem Relation Age of Onset   Leukemia Mother    Heart disease Father    Depression Father    Cirrhosis Sister    Colon polyps Sister    Alcohol abuse Brother    Drug abuse Brother    Suicidality Brother    Bipolar disorder Daughter    Prostate cancer Neg Hx    Kidney cancer Neg Hx    Breast cancer Neg Hx     Social History: I have reviewed social history from progress note on 02/27/2024. Social History   Socioeconomic History   Marital status: Widowed    Spouse name: Not on file   Number of children: 5   Years of education: Not on file   Highest education level: 11th grade  Occupational History   Not on file  Tobacco Use   Smoking status: Never   Smokeless tobacco: Never  Vaping Use   Vaping status: Never Used  Substance and Sexual Activity   Alcohol use: No    Alcohol/week: 0.0 standard drinks of alcohol   Drug use: No   Sexual activity: Never  Other Topics Concern   Not on file   Social History Narrative   Lives alone with her dogs. One son lives next door and one within a few miles.       Social Drivers of Corporate investment banker Strain: Low Risk  (02/21/2024)   Received from [facility]   Overall Financial Resource Strain (CARDIA)    Difficulty of Paying Living Expenses: Not hard at all  Recent Concern: Financial Resource Strain - Medium Risk (11/28/2023)   Received from [facility]   Overall Financial Resource Strain (CARDIA)    Difficulty of Paying Living Expenses: Somewhat hard  Food Insecurity: No Food Insecurity (02/21/2024)   Received from [facility]   Hunger Vital Sign    Within the past 12 months, you worried that your food would run out before you got the money to buy more.: Never true    Within the past 12 months, the food you bought just  didn't last and you didn't have money to get more.: Never true  Transportation Needs: No Transportation Needs (02/21/2024)   Received from [facility] - Transportation    In the past 12 months, has lack of transportation kept you from medical appointments or from getting medications?: No    Lack of Transportation (Non-Medical): No  Physical Activity: Not on file  Stress: Stress Concern Present (07/04/2017)   [employer] of Occupational Health - Occupational Stress Questionnaire    Feeling of Stress : Rather much  Social Connections: Not on file    Allergies:  Allergies  Allergen Reactions   Clarithromycin Other (See Comments)    Questionable Biaxin vs Flagyl with chest discomfort & irregular heartbeat (most likely thought to be related to the Biaxin) Other reaction(s): Other (See Comments) Questionable Biaxin vs Flagyl with chest discomfort & irregular heartbeat (most likely thought to be related to the Biaxin) Questionable Biaxin vs Flagyl with chest discomfort & irregular heartbeat (most likely thought to be related to  the Biaxin)   Fesoterodine     Other reaction(s): Other (See Comments) weakness Other reaction(s): Other (See Comments) Other reaction(s): Other (See Comments) weakness weakness   Lamotrigine Other (See Comments)    Hair thinning Hair thinning Other reaction(s): Unknown Hair thinning Hair thinning    Metronidazole Other (See Comments)    Questionable Biaxin vs Flagyl with chest discomfort & irregular heartbeat (most likely thought to be related to the Biaxin) Other reaction(s): Other (See Comments), Other (See Comments) Questionable Biaxin vs Flagyl with chest discomfort & irregular heartbeat (most likely thought to be related to the Biaxin) Questionable Biaxin vs Flagyl with chest discomfort & irregular heartbeat (most likely thought to be related to the Biaxin)   Other Other (See Comments)    decongestants   Penicillins Nausea Only   Simvastatin Other (See Comments)    Hari Loss Other reaction(s): Other (See Comments), Other (See Comments) Hari Loss Hari Loss   Sulfa Antibiotics     Other reaction(s): Unknown Other reaction(s): Unknown Patient cant remember   Toviaz  [Fesoterodine Fumarate Er] Other (See Comments)    weakness    Metabolic Disorder Labs: No results found for: HGBA1C, MPG No results found for: PROLACTIN Lab Results  Component Value Date   CHOL 267 (H) 07/22/2021   TRIG 102 07/22/2021   HDL 68 07/22/2021   CHOLHDL 3.9 07/22/2021   LDLCALC 181 (H) 07/22/2021   LDLCALC 122 (H) 10/10/2020   Lab Results  Component Value Date   TSH 4.640 (H) 11/02/2022   TSH 2.170 03/30/2021    Therapeutic Level Labs: No results found for: LITHIUM No results found for: VALPROATE No results found for: CBMZ  Current Medications: Current Outpatient Medications  Medication Sig Dispense Refill   eszopiclone (LUNESTA) 1 MG TABS tablet Take 1 tablet (1 mg total) by mouth at bedtime as needed for up to 15 days for sleep. Take immediately before bedtime 15  tablet 0   alendronate  (FOSAMAX ) 70 MG tablet Take 70 mg by mouth.     aspirin 81 MG tablet Take 81 mg by mouth daily.     busPIRone  (BUSPAR ) 10 MG tablet Take 1 tablet (10 mg total) by mouth daily as needed (anxiety). 90 tablet 0   Calcium  Carb-Cholecalciferol 600-200 MG-UNIT TABS Take 1 tablet by mouth 2 (two) times daily.     Cholecalciferol (VITAMIN D  PO) Take by mouth.     docusate sodium (COLACE) 100 MG capsule Take  by mouth 2 (two) times daily.     estradiol (ESTRACE) 0.1 MG/GM vaginal cream 1/4 app per vagina twice weekly     ezetimibe (ZETIA) 10 MG tablet Take 10 mg by mouth daily.     fexofenadine  (ALLEGRA ) 180 MG tablet Take 180 mg by mouth daily.     fluocinolone  (SYNALAR ) 0.01 % external solution Apply to affected ear twice daily as needed. 60 mL 1   minoxidil  (LONITEN ) 2.5 MG tablet Take 1 tablet (2.5 mg total) by mouth daily. 90 tablet 1   pantoprazole  (PROTONIX ) 40 MG tablet Take 1 tablet (40 mg total) by mouth daily. (Patient taking differently: Take 40 mg by mouth 2 (two) times daily before a meal.) 90 tablet 2   polyethylene glycol (MIRALAX / GLYCOLAX) 17 g packet Take 17 g by mouth daily.     venlafaxine  XR (EFFEXOR  XR) 75 MG 24 hr capsule Take 1 capsule (75 mg total) by mouth daily with breakfast. 90 capsule 0   No current facility-administered medications for this visit.     Musculoskeletal: Strength & Muscle Tone: within normal limits Gait & Station: normal Patient leans: N/A  Psychiatric Specialty Exam: Review of Systems  Psychiatric/Behavioral:  Positive for sleep disturbance.     Blood pressure 135/78, pulse 86, temperature (!) 97.3 F (36.3 C), temperature source Temporal, height 5' (1.524 m), weight 123 lb 3.2 oz (55.9 kg).Body mass index is 24.06 kg/m.  General Appearance: Casual  Eye Contact:  Fair  Speech:  Clear and Coherent  Volume:  Normal  Mood:  Anxious and Depressed  Affect:  Appropriate  Thought Process:  Goal Directed and Descriptions of  Associations: Intact  Orientation:  Full (Time, Place, and Person)  Thought Content: Logical   Suicidal Thoughts:  No  Homicidal Thoughts:  No  Memory:  Immediate;   Fair Recent;   Fair Remote;   Fair  Judgement:  Fair  Insight:  Fair  Psychomotor Activity:  Normal  Concentration:  Concentration: Fair and Attention Span: Fair  Recall:  [employer] of Knowledge: Fair  Language: Fair  Akathisia:  No  Handed:  Right  AIMS (if indicated): not done  Assets:  Communication Skills Desire for Improvement Housing Social Support  ADL's:  Intact  Cognition: WNL  Sleep:  Poor   Screenings: GAD-7    Loss adjuster, chartered Office Visit from 04/05/2024 in [facility] Office Visit from 01/09/2024 in [facility] Office Visit from 07/22/2021 in [facility] Clinical Support from 11/20/2019 in [facility]  Total GAD-7 Score 9 14 6 4    PHQ2-9    Flowsheet Row Office Visit from 04/05/2024 in [facility] Office Visit from 01/09/2024 in [facility] Office Visit from 06/30/2021 in [facility] Office Visit from 10/10/2020 in [facility] Office Visit from 03/18/2020 in [facility]  PHQ-2 Total Score 2 3 0 0 1  PHQ-9 Total Score 5 13 1 2 1    Flowsheet Row Office Visit from 02/27/2024 in [facility] Admission (Discharged) from 06/01/2022 in [facility] ED from 06/12/2021 in [facility] Emergency Department at [facility]  C-SSRS RISK CATEGORY No Risk No Risk No Risk     Assessment and Plan: Ann Young is a 83 year old Caucasian female who has a history of depression, anxiety, multiple  other medical problem was evaluated in office today for a  follow-up appointment.  Discussed assessment and plan as noted below.  1. GAD (generalized anxiety disorder)-unstable Continues to have anxiety symptoms although with some response to BuSpar  which is currently taken as once a day.  Does not believe the venlafaxine  at this dosage has made a difference. Continue venlafaxine  75 mg daily in the morning with plan to readjust the dosage in the future if Continue BuSpar  10 mg daily.  2. MDD (major depressive disorder), recurrent episode, mild-unstable Currently struggling with with depression symptoms likely also due to lack of sleep which leads to problems with fatigue, concentration and motivation during the day. Continue venlafaxine  75 mg daily   3. Insomnia, unspecified type-unstable Currently with significant sleep issues. Start Lunesta 1 mg at bedtime Provided medication education including sleep complex behaviors, habit-forming potential. Reviewed [place] PMP AWARxE  Follow-up Follow-up in clinic in 2 weeks or sooner if needed.   Consent: Patient/Guardian gives verbal consent for treatment and assignment of benefits for services provided during this visit. Patient/Guardian expressed understanding and agreed to proceed.    Angela Platner, MD 04/05/2024, 3:26 PM

## 2024-04-05 NOTE — Patient Instructions (Signed)
 Eszopiclone Tablets What is this medication? ESZOPICLONE (es ZOE pi clone) treats insomnia. It helps you go to sleep faster and stay asleep through the night. It is often used for a short period of time. This medicine may be used for other purposes; ask your health care provider or pharmacist if you have questions. COMMON BRAND NAME(S): Lunesta What should I tell my care team before I take this medication? They need to know if you have any of these conditions: Depression Liver disease Lung or breathing disease, such as asthma or COPD Substance use disorder Sleep-walking, driving, eating, or other activity while not fully awake after taking a sleep medication Suicidal thoughts, plans, or attempt by you or a family member An unusual or allergic reaction to eszopiclone, other medications, foods, dyes, or preservatives Pregnant or trying to get pregnant Breastfeeding How should I use this medication? Take this medication by mouth with a glass of water. Follow the directions on the prescription label. It is better to take this medication on an empty stomach and only when you are ready for bed. Do not take your medication more often than directed. If you have been taking this medication for several weeks and suddenly stop taking it, you may get unpleasant withdrawal symptoms. Your care team may want to gradually reduce the dose. Do not stop taking this medication on your own. Always follow your care team's advice. A special MedGuide will be given to you by the pharmacist with each prescription and refill. Be sure to read this information carefully each time. Talk to your care team about the use of this medication in children. Special care may be needed. Overdosage: If you think you have taken too much of this medicine contact a poison control center or emergency room at once. NOTE: This medicine is only for you. Do not share this medicine with others. What if I miss a dose? This does not apply. This  medication should only be taken immediately before going to sleep. Do not take double or extra doses. What may interact with this medication? Lorazepam Medications for fungal infections, such as ketoconazole, fluconazole, itraconazole Olanzapine Supplements, such as melatonin, St. John's wort, valerian This list may not describe all possible interactions. Give your health care provider a list of all the medicines, herbs, non-prescription drugs, or dietary supplements you use. Also tell them if you smoke, drink alcohol, or use illegal drugs. Some items may interact with your medicine. What should I watch for while using this medication? Visit your care team for regular checks on your progress. Keep a regular sleep schedule by going to bed at about the same time nightly. Avoid caffeine-containing drinks in the evening hours, as caffeine can cause trouble with falling asleep. Talk to your care team if you still have trouble sleeping. You may do unusual sleep behaviors or activities you do not remember the day after taking this medication. Activities include driving, making or eating food, talking on the phone, sexual activity, or sleep walking. Stop taking this medication and call your care team right away if you find out you have done activities like this. Plan to go to bed and stay in bed for a full night (7 to 8 hours) after you take this medication. You may still be drowsy the morning after taking this medication. This medication may affect your coordination, reaction time, or judgment. Do not drive or operate machinery until you know how this medication affects you. Sit up or stand slowly to reduce the risk  of dizzy or fainting spells. If you or your family notice any changes in your behavior, such as new or worsening depression, thoughts of harming yourself, anxiety, other unusual or disturbing thoughts, or memory loss, call your care team right away. After you stop taking this medication, you may  have trouble falling asleep. This is called rebound insomnia. This problem usually goes away on its own after 1 or 2 nights. What side effects may I notice from receiving this medication? Side effects that you should report to your care team as soon as possible: Allergic reactions or angioedema--skin rash, itching, hives, swelling of the face, eyes, lips, tongue, arms, or legs, trouble swallowing or breathing CNS depression--slow or shallow breathing, shortness of breath, feeling faint, dizziness, confusion, trouble staying awake Mood and behavior changes--anxiety, nervousness, confusion, hallucinations, irritability, hostility, thoughts of suicide or self-harm, worsening mood, feelings of depression Unusual sleep behaviors or activities you do not remember such as driving, eating, or sexual activity Side effects that usually do not require medical attention (report to your care team if they continue or are bothersome): Dizziness Drowsiness the day after use Dry mouth Headache Metallic taste in mouth This list may not describe all possible side effects. Call your doctor for medical advice about side effects. You may report side effects to FDA at 1-800-FDA-1088. Where should I keep my medication? Keep out of the reach of children and pets. This medication can be abused. Keep it in a safe place to protect it from theft. Do not share it with anyone. It is only for you. Selling or giving away this medication is dangerous and against the law. Store at room temperature between 20 and 25 degrees C (68 and 77 degrees F). Get rid of any unused medication after the expiration date. This medication may cause harm and death if taken by other adults, children, or pets. It is important to get rid of this medication as soon as you no longer need it or it has expired. You can do this in two ways: Take the medication to a medication take-back program. Check with your pharmacy or law enforcement to find a  location. If you cannot return the medication, check the label or package insert to see if the medication should be thrown out in the garbage or flushed down the toilet. If you are not sure, ask your care team. If it is safe to put it in the trash, take the medication out of the container. Mix the medication with cat litter, dirt, coffee grounds, or other unwanted substance. Seal the mixture in a bag or container. Put it in the trash. NOTE: This sheet is a summary. It may not cover all possible information. If you have questions about this medicine, talk to your doctor, pharmacist, or health care provider.  2024 Elsevier/Gold Standard (2022-12-02 00:00:00)

## 2024-04-24 ENCOUNTER — Encounter: Payer: Self-pay | Admitting: Psychiatry

## 2024-04-24 ENCOUNTER — Ambulatory Visit: Admitting: Psychiatry

## 2024-04-24 ENCOUNTER — Other Ambulatory Visit: Payer: Self-pay

## 2024-04-24 VITALS — BP 130/76 | HR 88 | Temp 96.5°F | Ht 60.0 in | Wt 125.2 lb

## 2024-04-24 DIAGNOSIS — F330 Major depressive disorder, recurrent, mild: Secondary | ICD-10-CM

## 2024-04-24 DIAGNOSIS — F411 Generalized anxiety disorder: Secondary | ICD-10-CM

## 2024-04-24 DIAGNOSIS — G4700 Insomnia, unspecified: Secondary | ICD-10-CM

## 2024-04-24 MED ORDER — ESZOPICLONE 2 MG PO TABS: 2.0000 mg | ORAL_TABLET | Freq: Every evening | ORAL | 0 refills | Status: DC | PRN

## 2024-04-24 MED ORDER — VENLAFAXINE HCL ER 37.5 MG PO CP24: 37.5000 mg | ORAL_CAPSULE | Freq: Every day | ORAL | 0 refills | Status: AC

## 2024-04-24 NOTE — Progress Notes (Unsigned)
 [facility] MD OP Progress Note  04/24/2024 10:56 AM Ann DYCHES  MRN:  [id number]  Chief Complaint:  Chief Complaint  Patient presents with   Follow-up   Depression   Medication Refill   Anxiety   Insomnia   Discussed the use of AI scribe software for clinical note transcription with the patient, who gave verbal consent to proceed.  History of Present Illness Ann Young is an 83 year old Caucasian female on SSI, lives in [place], has a history of GAD, MDD, GERD, hyperlipidemia, osteoporosis, hair loss, chronic constipation, history of colorectal polyp was evaluated in office today for a follow-up appointment.  She reports difficulty with sleep, particularly with falling asleep. She keeps bedtimes inconsistent, sometimes at 9 or 9:30 PM, and she often wakes early in the morning to feed pets. She currently takes Lunesta 1 mg for sleep, which provides some benefit, though she feels it is insufficient. Her medication regimen also includes Effexor  75 mg in the morning and Buspar  10 mg for anxiety. She denies experiencing any side effects from Lunesta or Effexor . For memory, she takes Prevagen and notices improvement, particularly when she resumes it after running out.  Since her last visit, she notes improvement in depression, which she attributes to increased social engagement, such as going to town and eating out with friends, her sister, and her boyfriend. She describes her mood as less depressed than previously. Anxiety has also improved, and she has not needed to take her buspirone  as needed in the past 2 or 3 days. She denies any recent changes in memory.She reports appetite is fair.  She continues to experience ongoing grief related to the death of her first husband 9 to 10 years ago, with certain memories, especially those involving past trauma with her family, continuing to cause distress. She denies current intrusive memories, flashbacks, or nightmares related to these events.  She  denies any suicidality, homicidality or perceptual disturbances.   Visit Diagnosis:    ICD-10-CM   1. GAD (generalized anxiety disorder)  F41.1 venlafaxine  XR (EFFEXOR -XR) 37.5 MG 24 hr capsule    2. MDD (major depressive disorder), recurrent episode, mild  F33.0 venlafaxine  XR (EFFEXOR -XR) 37.5 MG 24 hr capsule    3. Insomnia, unspecified type  G47.00 eszopiclone (LUNESTA) 2 MG TABS tablet      Past Psychiatric History: I have reviewed past psychiatric history from progress note on 02/27/2024.  Past trials of medications like mirtazapine , trazodone, sertraline , BuSpar , Celexa and several others.  Past Medical History:  Past Medical History:  Diagnosis Date   Actinic keratosis    Anxiety    Arthritis    Asthma    Cancer (HCC)    colorectal   Cataract cortical, senile, bilateral    Depression    Dyspepsia    Dyspepsia    Elevated cholesterol    Esophagitis    Gastritis    GERD (gastroesophageal reflux disease)    Helicobacter pylori (H. pylori) infection    Hemorrhoids    Hiatal hernia    History of hiatal hernia    Hx of adenomatous colonic polyps    Osteoporosis    Peptic ulcer disease    Peptic ulcer disease    Shingles    Squamous cell carcinoma of skin    R temple, SCC IS txted with LN2 and 5FU from [facility]    Past Surgical History:  Procedure Laterality Date   BREAST BIOPSY Left 2000   benign   BREAST CYST ASPIRATION  CARDIAC CATHETERIZATION  2015   WNL   CATARACT EXTRACTION Bilateral 2013   CATARACT EXTRACTION, BILATERAL Bilateral 2013   Dr. Chrystine   COLON SURGERY     COLONOSCOPY N/A 10/03/2019   Procedure: COLONOSCOPY;  Surgeon: Toledo, Ladell POUR, MD;  Location: [facility] ENDOSCOPY;  Service: Gastroenterology;  Laterality: N/A;   COLONOSCOPY WITH PROPOFOL  N/A 02/04/2016   Procedure: COLONOSCOPY WITH PROPOFOL ;  Surgeon: Lamar ONEIDA Holmes, MD;  Location: [facility] ENDOSCOPY;  Service: Endoscopy;  Laterality: N/A;   ESOPHAGOGASTRODUODENOSCOPY (EGD) WITH PROPOFOL  N/A  02/04/2016   Procedure: ESOPHAGOGASTRODUODENOSCOPY (EGD) WITH PROPOFOL ;  Surgeon: Lamar ONEIDA Holmes, MD;  Location: [facility] ENDOSCOPY;  Service: Endoscopy;  Laterality: N/A;   ESOPHAGOGASTRODUODENOSCOPY (EGD) WITH PROPOFOL  N/A 10/03/2019   Procedure: ESOPHAGOGASTRODUODENOSCOPY (EGD) WITH PROPOFOL ;  Surgeon: Toledo, Ladell POUR, MD;  Location: [facility] ENDOSCOPY;  Service: Gastroenterology;  Laterality: N/A;   ESOPHAGOGASTRODUODENOSCOPY (EGD) WITH PROPOFOL  N/A 06/01/2022   Procedure: ESOPHAGOGASTRODUODENOSCOPY (EGD) WITH PROPOFOL ;  Surgeon: Maryruth Ole ONEIDA, MD;  Location: [facility] ENDOSCOPY;  Service: Endoscopy;  Laterality: N/A;   HYSTEROSCOPY WITH D & C N/A 02/04/2017   Procedure: DILATATION AND CURETTAGE /HYSTEROSCOPY;  Surgeon: Schermerhorn, Debby PARAS, MD;  Location: [facility] ORS;  Service: Gynecology;  Laterality: N/A;   resection of colorectal cancer without sequela  1999   resection of colorectal cancer without sequela     TUBAL LIGATION  1976    Family Psychiatric History: I have reviewed family psychiatric history from progress note on 02/27/2024.  Family History:  Family History  Problem Relation Age of Onset   Leukemia Mother    Heart disease Father    Depression Father    Cirrhosis Sister    Colon polyps Sister    Alcohol abuse Brother    Drug abuse Brother    Suicidality Brother    Bipolar disorder Daughter    Prostate cancer Neg Hx    Kidney cancer Neg Hx    Breast cancer Neg Hx     Social History: I have reviewed social history from progress note on 02/27/2024. Social History   Socioeconomic History   Marital status: Widowed    Spouse name: Not on file   Number of children: 5   Years of education: Not on file   Highest education level: 11th grade  Occupational History   Not on file  Tobacco Use   Smoking status: Never   Smokeless tobacco: Never  Vaping Use   Vaping status: Never Used  Substance and Sexual Activity   Alcohol use: No    Alcohol/week: 0.0 standard drinks of  alcohol   Drug use: No   Sexual activity: Never  Other Topics Concern   Not on file  Social History Narrative   Lives alone with her dogs. One son lives next door and one within a few miles.       Social Drivers of Corporate Investment Banker Strain: Low Risk  (02/21/2024)   Received from [facility]   Overall Financial Resource Strain (CARDIA)    Difficulty of Paying Living Expenses: Not hard at all  Recent Concern: Financial Resource Strain - Medium Risk (11/28/2023)   Received from [facility]   Overall Financial Resource Strain (CARDIA)    Difficulty of Paying Living Expenses: Somewhat hard  Food Insecurity: No Food Insecurity (02/21/2024)   [facility]   Hunger Vital Sign    Within the past 12 months, you worried that your food would run out before  you got the money to buy more.: Never true    Within the past 12 months, the food you bought just didn't last and you didn't have money to get more.: Never true  Transportation Needs: No Transportation Needs (02/21/2024)   Received [facility] - Transportation    In the past 12 months, has lack of transportation kept you from medical appointments or from getting medications?: No    Lack of Transportation (Non-Medical): No  Physical Activity: Not on file  Stress: Stress Concern Present (07/04/2017)   [employer] of Occupational [employer] - Occupational Stress Questionnaire    Feeling of Stress : Rather much  Social Connections: Not on file    Allergies:  Allergies  Allergen Reactions   Clarithromycin Other (See Comments)    Questionable Biaxin vs Flagyl with chest discomfort & irregular heartbeat (most likely thought to be related to the Biaxin) Other reaction(s): Other (See Comments) Questionable Biaxin vs Flagyl with chest discomfort & irregular heartbeat (most likely thought to be related to the Biaxin) Questionable Biaxin vs  Flagyl with chest discomfort & irregular heartbeat (most likely thought to be related to the Biaxin)   Fesoterodine     Other reaction(s): Other (See Comments) weakness Other reaction(s): Other (See Comments) Other reaction(s): Other (See Comments) weakness weakness   Lamotrigine Other (See Comments)    Hair thinning Hair thinning Other reaction(s): Unknown Hair thinning Hair thinning    Metronidazole Other (See Comments)    Questionable Biaxin vs Flagyl with chest discomfort & irregular heartbeat (most likely thought to be related to the Biaxin) Other reaction(s): Other (See Comments), Other (See Comments) Questionable Biaxin vs Flagyl with chest discomfort & irregular heartbeat (most likely thought to be related to the Biaxin) Questionable Biaxin vs Flagyl with chest discomfort & irregular heartbeat (most likely thought to be related to the Biaxin)   Other Other (See Comments)    decongestants   Penicillins Nausea Only   Simvastatin Other (See Comments)    Hari Loss Other reaction(s): Other (See Comments), Other (See Comments) Hari Loss Hari Loss   Sulfa Antibiotics     Other reaction(s): Unknown Other reaction(s): Unknown Patient cant remember   Toviaz  [Fesoterodine Fumarate Er] Other (See Comments)    weakness    Metabolic Disorder Labs: No results found for: HGBA1C, MPG No results found for: PROLACTIN Lab Results  Component Value Date   CHOL 267 (H) 07/22/2021   TRIG 102 07/22/2021   HDL 68 07/22/2021   CHOLHDL 3.9 07/22/2021   LDLCALC 181 (H) 07/22/2021   LDLCALC 122 (H) 10/10/2020   Lab Results  Component Value Date   TSH 4.640 (H) 11/02/2022   TSH 2.170 03/30/2021    Therapeutic Level Labs: No results found for: LITHIUM No results found for: VALPROATE No results found for: CBMZ  Current Medications: Current Outpatient Medications  Medication Sig Dispense Refill   eszopiclone (LUNESTA) 2 MG TABS tablet Take 1 tablet (2 mg total) by  mouth at bedtime as needed for sleep. Take immediately before bedtime 30 tablet 0   venlafaxine  XR (EFFEXOR -XR) 37.5 MG 24 hr capsule Take 1 capsule (37.5 mg total) by mouth daily with breakfast. Take along with 75 mg daily , total of 112.5 mg 90 capsule 0   alendronate  (FOSAMAX ) 70 MG tablet Take 70 mg by mouth.     aspirin 81 MG tablet Take 81 mg by mouth daily.     busPIRone  (BUSPAR ) 10 MG tablet Take  1 tablet (10 mg total) by mouth daily as needed (anxiety). 90 tablet 0   Calcium  Carb-Cholecalciferol 600-200 MG-UNIT TABS Take 1 tablet by mouth 2 (two) times daily.     Cholecalciferol (VITAMIN D  PO) Take by mouth.     docusate sodium (COLACE) 100 MG capsule Take by mouth 2 (two) times daily.     estradiol (ESTRACE) 0.1 MG/GM vaginal cream 1/4 app per vagina twice weekly     ezetimibe (ZETIA) 10 MG tablet Take 10 mg by mouth daily.     fexofenadine  (ALLEGRA ) 180 MG tablet Take 180 mg by mouth daily.     fluocinolone  (SYNALAR ) 0.01 % external solution Apply to affected ear twice daily as needed. 60 mL 1   minoxidil  (LONITEN ) 2.5 MG tablet Take 1 tablet (2.5 mg total) by mouth daily. 90 tablet 1   pantoprazole  (PROTONIX ) 40 MG tablet Take 1 tablet (40 mg total) by mouth daily. (Patient taking differently: Take 40 mg by mouth 2 (two) times daily before a meal.) 90 tablet 2   polyethylene glycol (MIRALAX / GLYCOLAX) 17 g packet Take 17 g by mouth daily.     venlafaxine  XR (EFFEXOR  XR) 75 MG 24 hr capsule Take 1 capsule (75 mg total) by mouth daily with breakfast. 90 capsule 0   No current facility-administered medications for this visit.     Musculoskeletal: Strength & Muscle Tone: within normal limits Gait & Station: normal Patient leans: N/A  Psychiatric Specialty Exam: Review of Systems  Psychiatric/Behavioral:  Positive for dysphoric mood and sleep disturbance. The patient is nervous/anxious.     Blood pressure 130/76, pulse 88, temperature (!) 96.5 F (35.8 C), temperature source  Temporal, height 5' (1.524 m), weight 125 lb 3.2 oz (56.8 kg).Body mass index is 24.45 kg/m.  General Appearance: Casual  Eye Contact:  Good  Speech:  Clear and Coherent  Volume:  Normal  Mood:  Anxious and Depressed improving  Affect:  Appropriate  Thought Process:  Goal Directed and Descriptions of Associations: Intact  Orientation:  Full (Time, Place, and Person)  Thought Content: Logical   Suicidal Thoughts:  No  Homicidal Thoughts:  No  Memory:  Immediate;   Fair Recent;   Fair Remote;   Fair  Judgement:  Fair  Insight:  Fair  Psychomotor Activity:  Normal  Concentration:  Concentration: Fair and Attention Span: Fair  Recall:  [employer] of Knowledge: Fair  Language: Fair  Akathisia:  No  Handed:  Right  AIMS (if indicated): not done  Assets:  Communication Skills Desire for Improvement Housing Social Support Talents/Skills Transportation  ADL's:  Intact  Cognition: WNL  Sleep:  some improvement    Screenings: GAD-7    Loss Adjuster, Chartered Office Visit from 04/05/2024 in [facility] Office Visit from 01/09/2024 in [facility] Office Visit from 07/22/2021 in [facility] Clinical Support from 11/20/2019 in [facility]  Total GAD-7 Score 9 14 6 4    PHQ2-9    Flowsheet Row Office Visit from 04/05/2024 in [facility] Office Visit from 01/09/2024 in [facility] Office Visit from 06/30/2021 in [facility] Office Visit from 10/10/2020 in [facility] Office Visit from 03/18/2020 in [facility]  PHQ-2 Total Score 2 3 0 0 1  PHQ-9 Total Score 5 13 1 2 1    [employer]  Visit from 04/24/2024 in [facility] Office Visit from 04/05/2024 in [facility] Office Visit from 02/27/2024 in [facility]  C-SSRS RISK CATEGORY No Risk No Risk No Risk     Assessment and Plan: Ann Young is a 83 year old Caucasian female who presented for a follow-up appointment, discussed assessment and plan as noted below.  1. GAD (generalized anxiety disorder)-improving Reports anxiety symptoms have improved compared to 2 weeks ago.  Interested in dosage increase of venlafaxine . Increase Venlafaxine  to 112.5 mg daily Continue BuSpar  10 mg daily.  2. MDD (major depressive disorder), recurrent episode, mild-improving Manage reports depression symptoms improved although continues to have sleep problems. Continue Venlafaxine , dosage uptitrated as noted above.  3. Insomnia, unspecified type-unstable Continues to have difficulty falling asleep and staying asleep.  Currently does not have a good sleep hygiene which is likely contributing as well.  Lunesta may have helped to some extent. Increase Lunesta to 2 mg at bedtime. Discussed sleep hygiene technique including setting up a bedtime routine, having a set bedtime and wake up time, switching up electronic devices prior to bedtime. Reviewed [place] PMP AWARxE   Follow-up Follow-up in clinic in 2 to 3 weeks or sooner if needed.   Consent: Patient/Guardian gives verbal consent for treatment and assignment of benefits for services provided during this visit. Patient/Guardian expressed understanding and agreed to proceed.   This note was generated in part or whole with voice recognition software. Voice recognition is usually quite accurate but there are transcription errors that can and very often do occur. I apologize for any typographical errors that were not detected and corrected.    Dovid Bartko, MD 04/25/2024, 7:40 AM

## 2024-05-17 ENCOUNTER — Ambulatory Visit: Admitting: Psychiatry

## 2024-05-17 ENCOUNTER — Encounter: Payer: Self-pay | Admitting: Psychiatry

## 2024-05-17 ENCOUNTER — Other Ambulatory Visit: Payer: Self-pay

## 2024-05-17 VITALS — BP 121/78 | HR 93 | Temp 97.9°F | Ht 60.0 in | Wt 124.6 lb

## 2024-05-17 DIAGNOSIS — F411 Generalized anxiety disorder: Secondary | ICD-10-CM

## 2024-05-17 DIAGNOSIS — G4700 Insomnia, unspecified: Secondary | ICD-10-CM | POA: Diagnosis not present

## 2024-05-17 DIAGNOSIS — F3341 Major depressive disorder, recurrent, in partial remission: Secondary | ICD-10-CM | POA: Diagnosis not present

## 2024-05-17 MED ORDER — ZALEPLON 5 MG PO CAPS: 5.0000 mg | ORAL_CAPSULE | Freq: Every evening | ORAL | 0 refills | Status: AC | PRN

## 2024-05-17 NOTE — Patient Instructions (Signed)
Zaleplon Capsules What is this medication? ZALEPLON (ZAL e plon) treats insomnia. It helps you go to sleep faster. It is often used for a short time. This medicine may be used for other purposes; ask your health care provider or pharmacist if you have questions. COMMON BRAND NAME(S): Sonata What should I tell my care team before I take this medication? They need to know if you have any of these conditions: Depression Liver disease Lung or breathing disease Substance use disorder Suicidal thoughts, plans, or attempt by you or a family member Unusual sleep behaviors or activities you do not remember An unusual or allergic reaction to zaleplon, other medications, foods, dyes, or preservatives Pregnant or trying to get pregnant Breastfeeding How should I use this medication? Take this medication by mouth with water. Take it as directed on the label. Do not use it more often than directed. There may be unused or extra doses in the bottle after you finish your treatment. Talk to your care team if you have questions about your dose. A special MedGuide will be given to you by the pharmacist with each prescription and refill. Be sure to read this information carefully each time. Talk to your care team about the use of this medication in children. Special care may be needed. Overdosage: If you think you have taken too much of this medicine contact a poison control center or emergency room at once. NOTE: This medicine is only for you. Do not share this medicine with others. What if I miss a dose? This does not apply. This medication should only be taken immediately before going to sleep. Do not take double or extra doses. What may interact with this medication? Barbiturate medications for inducing sleep or treating seizures Carbamazepine Certain medications for allergies, such as azatadine, clemastine, diphenhydramine Certain medications for mental health conditions Certain medications for  pain Cimetidine Erythromycin Medications for fungal infections, such as ketoconazole, fluconazole, or itraconazole Other medications given for sleep Phenytoin Rifampin This list may not describe all possible interactions. Give your health care provider a list of all the medicines, herbs, non-prescription drugs, or dietary supplements you use. Also tell them if you smoke, drink alcohol, or use illegal drugs. Some items may interact with your medicine. What should I watch for while using this medication? Visit your care team for regular checks on your progress. Keep a regular sleep schedule by going to bed at about the same time each night. Avoid caffeine-containing drinks in the evening hours. When sleep medications are used every night for more than a few weeks, they may stop working. Talk to your care team if you still have trouble sleeping. You may do unusual sleep behaviors or activities you do not remember the day after taking this medication. Activities include driving, making or eating food, talking on the phone, sexual activity, or sleep walking. Stop taking this medication and call your care team right away if you find out you have done activities like this. Plan to go to bed and stay in bed for a full night (7 to 8 hours) after you take this medication. You may still be drowsy the morning after taking this medication. This medication may affect your coordination, reaction time, or judgment. Do not drive or operate machinery until you know how this medication affects you. Sit up or stand slowly to reduce the risk of dizzy or fainting spells. If you or your family notice any changes in your behavior, such as new or worsening depression, thoughts of  harming yourself, anxiety, other unusual or disturbing thoughts, or memory loss, call your care team right away. After you stop taking this medication, you may have trouble falling asleep. This is called rebound insomnia. This problem usually goes away  on its own after 1 or 2 nights. What side effects may I notice from receiving this medication? Side effects that you should report to your care team as soon as possible: Allergic reactions--skin rash, itching, hives, swelling of the face, lips, tongue, or throat Change in vision such as blurry vision, seeing halos around lights, vision loss CNS depression--slow or shallow breathing, shortness of breath, feeling faint, dizziness, confusion, difficulty staying awake Mood and behavior changes--anxiety, nervousness, confusion, hallucinations, irritability, hostility, thoughts of suicide or self-harm, worsening mood, feelings of depression Unusual sleep behaviors or activities you do not remember such as driving, eating, or sexual activity Side effects that usually do not require medical attention (report to your care team if they continue or are bothersome): Diarrhea Dizziness Drowsiness the day after use Pain, tingling, or numbness in the hands or feet This list may not describe all possible side effects. Call your doctor for medical advice about side effects. You may report side effects to FDA at 1-800-FDA-1088. Where should I keep my medication? Keep out of the reach of children and pets. This medication can be abused. Keep your medication in a safe place to protect it from theft. Do not share this medication with anyone. Selling or giving away this medication is dangerous and against the law. Store at room temperature between 20 and 25 degrees C (68 and 77 degrees F). Protect from light. This medication may cause accidental overdose and death if taken by other adults, children, or pets. Mix any unused medication with a substance like cat litter or coffee grounds. Then throw the medication away in a sealed container like a sealed bag or a coffee can with a lid. Do not use the medication after the expiration date. NOTE: This sheet is a summary. It may not cover all possible information. If you have  questions about this medicine, talk to your doctor, pharmacist, or health care provider.  2024 Elsevier/Gold Standard (2022-05-30 00:00:00)

## 2024-05-17 NOTE — Progress Notes (Signed)
 [facility] MD OP Progress Note  05/17/2024 10:00 AM TERRESSA EVOLA  MRN:  [id number]  Chief Complaint:  Chief Complaint  Patient presents with   Follow-up   Depression   Medication Refill   Insomnia   Discussed the use of AI scribe software for clinical note transcription with the patient, who gave verbal consent to proceed.  History of Present Illness Ann Young is an 83 year old Caucasian female on SSI, lives in [place], has a history of GAD, MDD, GERD, hyperlipidemia, osteoporosis, hair loss, chronic constipation, history of colorectal polyp was evaluated in office today for a follow-up appointment.  She continues to have difficulty with sleep, even while taking Lunesta  2 mg nightly. She typically goes to bed around 9:00 or 9:30 PM but often does not fall asleep until 2:00 AM. She takes Lunesta  30 minutes to 1 hour before bed, yet she notes no improvement in sleep onset. After going to bed, she lies in bed attempting to sleep and does not engage in activities such as reading or watching TV. Nighttime interruptions occur every 2 to 2.5 hours due to bladder issues, requiring her to get up to void, which disrupts her sleep.  She reports that she is doing better with her mood and feels able to relax better. She describes her appetite as too good, especially during the holidays. She denies suicidal thoughts and thoughts of harming others.  She has been taking BuSpar  more frequently as needed in the past few days. She reports that holiday stress and her cat's disruptive nighttime behavior, which requires her to close the cat out of her bedroom, contribute to her anxiety. Additional stress arises from her dog's health issues and past trauma the dog experienced before coming to her care.  She reports the BuSpar  however has been beneficial in managing her mood symptoms.  Her current medications include Venlafaxine , Lunesta  she did and Buspar  as needed for anxiety.  Denies side effects.   Visit  Diagnosis:    ICD-10-CM   1. GAD (generalized anxiety disorder)  F41.1     2. MDD (major depressive disorder), recurrent, in partial remission  F33.41     3. Insomnia, unspecified type  G47.00 zaleplon  (SONATA ) 5 MG capsule      Past Psychiatric History: I have reviewed past psychiatric history from progress note on 02/27/2024.  Past trials of medications like mirtazapine , trazodone, sertraline , BuSpar , Celexa and several others.  Past Medical History:  Past Medical History:  Diagnosis Date   Actinic keratosis    Anxiety    Arthritis    Asthma    Cancer (HCC)    colorectal   Cataract cortical, senile, bilateral    Depression    Dyspepsia    Dyspepsia    Elevated cholesterol    Esophagitis    Gastritis    GERD (gastroesophageal reflux disease)    Helicobacter pylori (H. pylori) infection    Hemorrhoids    Hiatal hernia    History of hiatal hernia    Hx of adenomatous colonic polyps    Osteoporosis    Peptic ulcer disease    Peptic ulcer disease    Shingles    Squamous cell carcinoma of skin    R temple, SCC IS txted with LN2 and 5FU from [facility]    Past Surgical History:  Procedure Laterality Date   BREAST BIOPSY Left 2000   benign   BREAST CYST ASPIRATION     CARDIAC CATHETERIZATION  2015   WNL   CATARACT  EXTRACTION Bilateral 2013   CATARACT EXTRACTION, BILATERAL Bilateral 2013   Dr. Chrystine   COLON SURGERY     COLONOSCOPY N/A 10/03/2019   Procedure: COLONOSCOPY;  Surgeon: Toledo, Ladell POUR, MD;  Location: [facility] ENDOSCOPY;  Service: Gastroenterology;  Laterality: N/A;   COLONOSCOPY WITH PROPOFOL  N/A 02/04/2016   Procedure: COLONOSCOPY WITH PROPOFOL ;  Surgeon: Lamar ONEIDA Holmes, MD;  Location[facility] ENDOSCOPY;  Service: Endoscopy;  Laterality: N/A;   ESOPHAGOGASTRODUODENOSCOPY (EGD) WITH PROPOFOL  N/A 02/04/2016   Procedure: ESOPHAGOGASTRODUODENOSCOPY (EGD) WITH PROPOFOL ;  Surgeon: Lamar ONEIDA Holmes, MD;  Location: [facility] ENDOSCOPY;  Service: Endoscopy;  Laterality: N/A;    ESOPHAGOGASTRODUODENOSCOPY (EGD) WITH PROPOFOL  N/A 10/03/2019   Procedure: ESOPHAGOGASTRODUODENOSCOPY (EGD) WITH PROPOFOL ;  Surgeon: Toledo, Ladell POUR, MD;  Location: [facility] ENDOSCOPY;  Service: Gastroenterology;  Laterality: N/A;   ESOPHAGOGASTRODUODENOSCOPY (EGD) WITH PROPOFOL  N/A 06/01/2022   Procedure: ESOPHAGOGASTRODUODENOSCOPY (EGD) WITH PROPOFOL ;  Surgeon: Maryruth Ole ONEIDA, MD;  Location: [facility] ENDOSCOPY;  Service: Endoscopy;  Laterality: N/A;   HYSTEROSCOPY WITH D & C N/A 02/04/2017   Procedure: DILATATION AND CURETTAGE /HYSTEROSCOPY;  Surgeon: Schermerhorn, Debby PARAS, MD;  Location: [facility] ORS;  Service: Gynecology;  Laterality: N/A;   resection of colorectal cancer without sequela  1999   resection of colorectal cancer without sequela     TUBAL LIGATION  1976    Family Psychiatric History: I have reviewed family psychiatric history from progress note on 02/27/2024.  Family History:  Family History  Problem Relation Age of Onset   Leukemia Mother    Heart disease Father    Depression Father    Cirrhosis Sister    Colon polyps Sister    Alcohol abuse Brother    Drug abuse Brother    Suicidality Brother    Bipolar disorder Daughter    Prostate cancer Neg Hx    Kidney cancer Neg Hx    Breast cancer Neg Hx     Social History: I have reviewed social history from progress note on 02/27/2024. Social History   Socioeconomic History   Marital status: Widowed    Spouse name: Not on file   Number of children: 5   Years of education: Not on file   Highest education level: 11th grade  Occupational History   Not on file  Tobacco Use   Smoking status: Never   Smokeless tobacco: Never  Vaping Use   Vaping status: Never Used  Substance and Sexual Activity   Alcohol use: No    Alcohol/week: 0.0 standard drinks of alcohol   Drug use: No   Sexual activity: Never  Other Topics Concern   Not on file  Social History Narrative   Lives alone with her dogs. One son lives next door and  one within a few miles.       Social Drivers of Corporate Investment Banker Strain: Low Risk  (02/21/2024)   Received from [facility]   Overall Financial Resource Strain (CARDIA)    Difficulty of Paying Living Expenses: Not hard at all  Recent Concern: Financial Resource Strain - Medium Risk (11/28/2023)   Received from [facility]   Overall Financial Resource Strain (CARDIA)    Difficulty of Paying Living Expenses: Somewhat hard  Food Insecurity: No Food Insecurity (02/21/2024)   Received from [facility]   Hunger Vital Sign    Within the past 12 months, you worried that your food would run out before you got the money to buy more.: Never true  Within the past 12 months, the food you bought just didn't last and you didn't have money to get more.: Never true  Transportation Needs: No Transportation Needs (02/21/2024)   Received from [facility] - Transportation    In the past 12 months, has lack of transportation kept you from medical appointments or from getting medications?: No    Lack of Transportation (Non-Medical): No  Physical Activity: Not on file  Stress: Stress Concern Present (07/04/2017)   [employer] of Occupational [employer] - Occupational Stress Questionnaire    Feeling of Stress : Rather much  Social Connections: Not on file    Allergies:  Allergies  Allergen Reactions   Clarithromycin Other (See Comments)    Questionable Biaxin vs Flagyl with chest discomfort & irregular heartbeat (most likely thought to be related to the Biaxin) Other reaction(s): Other (See Comments) Questionable Biaxin vs Flagyl with chest discomfort & irregular heartbeat (most likely thought to be related to the Biaxin) Questionable Biaxin vs Flagyl with chest discomfort & irregular heartbeat (most likely thought to be related to the Biaxin)   Fesoterodine     Other reaction(s): Other (See  Comments) weakness Other reaction(s): Other (See Comments) Other reaction(s): Other (See Comments) weakness weakness   Lamotrigine Other (See Comments)    Hair thinning Hair thinning Other reaction(s): Unknown Hair thinning Hair thinning    Metronidazole Other (See Comments)    Questionable Biaxin vs Flagyl with chest discomfort & irregular heartbeat (most likely thought to be related to the Biaxin) Other reaction(s): Other (See Comments), Other (See Comments) Questionable Biaxin vs Flagyl with chest discomfort & irregular heartbeat (most likely thought to be related to the Biaxin) Questionable Biaxin vs Flagyl with chest discomfort & irregular heartbeat (most likely thought to be related to the Biaxin)   Other Other (See Comments)    decongestants   Penicillins Nausea Only   Simvastatin Other (See Comments)    Hari Loss Other reaction(s): Other (See Comments), Other (See Comments) Hari Loss Hari Loss   Sulfa Antibiotics     Other reaction(s): Unknown Other reaction(s): Unknown Patient cant remember   Toviaz  [Fesoterodine Fumarate Er] Other (See Comments)    weakness    Metabolic Disorder Labs: No results found for: HGBA1C, MPG No results found for: PROLACTIN Lab Results  Component Value Date   CHOL 267 (H) 07/22/2021   TRIG 102 07/22/2021   HDL 68 07/22/2021   CHOLHDL 3.9 07/22/2021   LDLCALC 181 (H) 07/22/2021   LDLCALC 122 (H) 10/10/2020   Lab Results  Component Value Date   TSH 4.640 (H) 11/02/2022   TSH 2.170 03/30/2021    Therapeutic Level Labs: No results found for: LITHIUM No results found for: VALPROATE No results found for: CBMZ  Current Medications: Current Outpatient Medications  Medication Sig Dispense Refill   zaleplon (SONATA) 5 MG capsule Take 1 capsule (5 mg total) by mouth at bedtime as needed for sleep. Stop Eszopiclone  . 30 capsule 0   alendronate  (FOSAMAX ) 70 MG tablet Take 70 mg by mouth.     aspirin 81 MG tablet Take 81  mg by mouth daily.     busPIRone  (BUSPAR ) 10 MG tablet Take 1 tablet (10 mg total) by mouth daily as needed (anxiety). 90 tablet 0   Calcium  Carb-Cholecalciferol 600-200 MG-UNIT TABS Take 1 tablet by mouth 2 (two) times daily.     Cholecalciferol (VITAMIN D  PO) Take by mouth.     docusate sodium (COLACE) 100  MG capsule Take by mouth 2 (two) times daily.     estradiol (ESTRACE) 0.1 MG/GM vaginal cream 1/4 app per vagina twice weekly     ezetimibe (ZETIA) 10 MG tablet Take 10 mg by mouth daily.     fexofenadine  (ALLEGRA ) 180 MG tablet Take 180 mg by mouth daily.     fluocinolone  (SYNALAR ) 0.01 % external solution Apply to affected ear twice daily as needed. 60 mL 1   minoxidil  (LONITEN ) 2.5 MG tablet Take 1 tablet (2.5 mg total) by mouth daily. 90 tablet 1   pantoprazole  (PROTONIX ) 40 MG tablet Take 1 tablet (40 mg total) by mouth daily. (Patient taking differently: Take 40 mg by mouth 2 (two) times daily before a meal.) 90 tablet 2   polyethylene glycol (MIRALAX / GLYCOLAX) 17 g packet Take 17 g by mouth daily.     venlafaxine  XR (EFFEXOR  XR) 75 MG 24 hr capsule Take 1 capsule (75 mg total) by mouth daily with breakfast. 90 capsule 0   venlafaxine  XR (EFFEXOR -XR) 37.5 MG 24 hr capsule Take 1 capsule (37.5 mg total) by mouth daily with breakfast. Take along with 75 mg daily , total of 112.5 mg 90 capsule 0   No current facility-administered medications for this visit.     Musculoskeletal: Strength & Muscle Tone: within normal limits Gait & Station: normal Patient leans: N/A  Psychiatric Specialty Exam: Review of Systems  Psychiatric/Behavioral:  Positive for sleep disturbance. The patient is nervous/anxious.     Blood pressure 121/78, pulse 93, temperature 97.9 F (36.6 C), temperature source Temporal, height 5' (1.524 m), weight 124 lb 9.6 oz (56.5 kg).Body mass index is 24.33 kg/m.  General Appearance: Casual  Eye Contact:  Fair  Speech:  Clear and Coherent  Volume:  Normal  Mood:   Anxious  Affect:  Appropriate  Thought Process:  Goal Directed and Descriptions of Associations: Intact  Orientation:  Full (Time, Place, and Person)  Thought Content: Logical   Suicidal Thoughts:  No  Homicidal Thoughts:  No  Memory:  Immediate;   Fair Recent;   Fair Remote;   Fair  Judgement:  Fair  Insight:  Fair  Psychomotor Activity:  Normal  Concentration:  Concentration: Fair and Attention Span: Fair  Recall:  [employer] of Knowledge: Fair  Language: Fair  Akathisia:  No  Handed:  Right  AIMS (if indicated): not done  Assets:  Communication Skills Desire for Improvement Housing Talents/Skills Transportation  ADL's:  Intact  Cognition: WNL  Sleep:  Poor   Screenings: GAD-7    Garment/textile Technologist Visit from 05/17/2024 in [facility] Office Visit from 04/05/2024 in [facility] Visit from 01/09/2024 in [facility] Office Visit from 07/22/2021 in [facility] Clinical Support from 11/20/2019 in [facility]  Total GAD-7 Score 5 9 14 6 4    PHQ2-9    Flowsheet Row Office Visit from 05/17/2024 in [facility] Office Visit from 04/05/2024 in [facility] Office Visit from 01/09/2024 in [facility] Office Visit from 06/30/2021 in [facility] Office Visit from 10/10/2020 in [facility]  PHQ-2 Total Score 2 2 3  0 0  PHQ-9 Total Score 8 5 13 1 2    Flowsheet Row Office Visit from 05/17/2024 in [facility] Office Visit  from 04/24/2024 in [facility] Office Visit from 04/05/2024 in [facility]  C-SSRS RISK CATEGORY No Risk  No Risk No Risk     Assessment and Plan: Taylie E Mccutchan is a 83 year old Caucasian female who presented for a follow-up appointment.  Discussed assessment and plan as noted below.  1. GAD (generalized anxiety disorder)-improving Currently reports although she does have situational anxiety overall managing well. Continue Venlafaxine  112.5 mg daily Continue BuSpar  10 mg daily as needed  2. MDD (major depressive disorder), recurrent, in partial remission Notes depression symptoms is improved although continues to struggle with sleep Continue Venlafaxine  as prescribed  3. Insomnia, unspecified type-unstable Currently reports difficulty falling asleep.  Currently working on sleep hygiene techniques however does report her Disrupting her sleep as well as the need to void at night interrupts her sleep. Continue sleep hygiene techniques Discontinue Lunesta  for lack of benefit. Start Sonata 5 mg at bedtime Provided medication education. Reviewed [place] PMP AWARxE   Follow-up Follow-up in clinic in 1 month or sooner if needed.   Consent: Patient/Guardian gives verbal consent for treatment and assignment of benefits for services provided during this visit. Patient/Guardian expressed understanding and agreed to proceed.   This note was generated in part or whole with voice recognition software. Voice recognition is usually quite accurate but there are transcription errors that can and very often do occur. I apologize for any typographical errors that were not detected and corrected.    Farrin Shadle, MD 05/17/2024, 10:00 AM

## 2024-05-21 ENCOUNTER — Telehealth: Payer: Self-pay | Admitting: Psychiatry

## 2024-05-21 DIAGNOSIS — F411 Generalized anxiety disorder: Secondary | ICD-10-CM

## 2024-05-21 DIAGNOSIS — F330 Major depressive disorder, recurrent, mild: Secondary | ICD-10-CM

## 2024-05-21 MED ORDER — VENLAFAXINE HCL ER 75 MG PO CP24: 75.0000 mg | ORAL_CAPSULE | Freq: Every day | ORAL | 1 refills | Status: AC

## 2024-05-21 NOTE — Telephone Encounter (Signed)
 I have sent venlafaxine  75 mg to pharmacy.  She already has 37.5 mg available.  Total of 112.5 mg daily

## 2024-07-23 ENCOUNTER — Ambulatory Visit: Admitting: Psychiatry

## 2024-08-28 ENCOUNTER — Ambulatory Visit: Admitting: Dermatology
